# Patient Record
Sex: Male | Born: 1955 | Race: Black or African American | Hispanic: No | Marital: Single | Smoking: Current every day smoker
Health system: Southern US, Community
[De-identification: ages and names within clinical notes are randomized; demographics above are authoritative.]

## PROBLEM LIST (undated history)

## (undated) DIAGNOSIS — J449 Chronic obstructive pulmonary disease, unspecified: Secondary | ICD-10-CM

## (undated) DIAGNOSIS — I10 Essential (primary) hypertension: Secondary | ICD-10-CM

## (undated) DIAGNOSIS — F419 Anxiety disorder, unspecified: Secondary | ICD-10-CM

## (undated) HISTORY — DX: Essential (primary) hypertension: I10

## (undated) HISTORY — DX: Chronic obstructive pulmonary disease, unspecified: J44.9

## (undated) HISTORY — PX: HEMORRHOID SURGERY: SHX153

## (undated) HISTORY — DX: Anxiety disorder, unspecified: F41.9

---

## 2012-06-21 ENCOUNTER — Encounter (HOSPITAL_COMMUNITY): Payer: Self-pay | Admitting: Emergency Medicine

## 2012-06-21 ENCOUNTER — Emergency Department (HOSPITAL_COMMUNITY)
Admission: EM | Admit: 2012-06-21 | Discharge: 2012-06-21 | Disposition: A | Discharge status: Home / Self Care | Payer: Self-pay | Attending: Emergency Medicine | Admitting: Emergency Medicine

## 2012-06-21 ENCOUNTER — Emergency Department (HOSPITAL_COMMUNITY): Payer: Self-pay

## 2012-06-21 DIAGNOSIS — F17200 Nicotine dependence, unspecified, uncomplicated: Secondary | ICD-10-CM | POA: Insufficient documentation

## 2012-06-21 DIAGNOSIS — J441 Chronic obstructive pulmonary disease with (acute) exacerbation: Secondary | ICD-10-CM | POA: Insufficient documentation

## 2012-06-21 DIAGNOSIS — IMO0001 Reserved for inherently not codable concepts without codable children: Secondary | ICD-10-CM

## 2012-06-21 MED ORDER — ALBUTEROL SULFATE HFA 108 (90 BASE) MCG/ACT IN AERS: 1.0000 | INHALATION_SPRAY | Freq: Four times a day (QID) | RESPIRATORY_TRACT | Status: DC | PRN

## 2012-06-21 MED ORDER — PREDNISONE 20 MG PO TABS
60.0000 mg | ORAL_TABLET | Freq: Once | ORAL | Status: AC
  Administered 2012-06-21: 60 mg via ORAL
  Filled 2012-06-21: qty 3

## 2012-06-21 MED ORDER — PREDNISONE 50 MG PO TABS: ORAL_TABLET | ORAL | Status: DC

## 2012-06-21 MED ORDER — IPRATROPIUM BROMIDE 0.02 % IN SOLN
0.5000 mg | RESPIRATORY_TRACT | Status: AC
  Administered 2012-06-21 (×2): 0.5 mg via RESPIRATORY_TRACT
  Filled 2012-06-21 (×2): qty 2.5

## 2012-06-21 MED ORDER — ALBUTEROL SULFATE (5 MG/ML) 0.5% IN NEBU
5.0000 mg | INHALATION_SOLUTION | RESPIRATORY_TRACT | Status: AC
  Administered 2012-06-21 (×2): 5 mg via RESPIRATORY_TRACT
  Filled 2012-06-21: qty 0.5
  Filled 2012-06-21: qty 1

## 2012-06-21 NOTE — ED Notes (Signed)
States that SOB has been a problem for him over the past year. Has gotten progressively worse. No pain verbalized. Patient continues to smoke.

## 2012-06-21 NOTE — ED Notes (Signed)
Patient reports that he started to have SOB x 2 -3 months ago. Recently it is worse with walking short distances and very light movement. The patient reports that he has not had this in the past.

## 2012-06-21 NOTE — ED Notes (Signed)
EKG completed at 11:14

## 2012-06-21 NOTE — ED Provider Notes (Signed)
History     CSN: [id number]  Arrival date & time 06/21/12  9147   First MD Initiated Contact with Patient 06/21/12 1101      Chief Complaint  Patient presents with  . Shortness of Breath    (Consider location/radiation/quality/duration/timing/severity/associated sxs/prior treatment) The history is provided by the patient.  Shin Lamour is a 57 y.o. male chronic smoker here with SOB. He has SOB worse with exertion for a year. It is getting worse now. Over the last week, he gets SOB with walking to the bathroom. No cough or fever. No chest pain. No other cardiac risk factor other than smoking hx. No PE risk factors.    History reviewed. No pertinent past medical history.  History reviewed. No pertinent past surgical history.  No family history on file.  History  Substance Use Topics  . Smoking status: Current Every Day Smoker -- 0.50 packs/day    Types: Cigarettes  . Smokeless tobacco: Not on file  . Alcohol Use: Yes     Comment: occ      Review of Systems  Respiratory: Positive for shortness of breath.   All other systems reviewed and are negative.    Allergies  Review of patient's allergies indicates no known allergies.  Home Medications  No current outpatient prescriptions on file.  BP 151/89  Pulse 63  Temp(Src) 97.8 F (36.6 C) (Oral)  SpO2 100%  Physical Exam  Nursing note and vitals reviewed. Constitutional: He is oriented to person, place, and time. He appears well-developed and well-nourished.  Calm   HENT:  Head: Normocephalic.  Mouth/Throat: Oropharynx is clear and moist.  Eyes: Conjunctivae are normal. Pupils are equal, round, and reactive to light.  Neck: Normal range of motion. Neck supple.  Cardiovascular: Normal rate, regular rhythm and normal heart sounds.   Pulmonary/Chest: Effort normal.  Dec breath sounds throughout. No wheezing. Mod air movement.   Abdominal: Soft. Bowel sounds are normal. He exhibits no distension. There is no  tenderness. There is no rebound.  Musculoskeletal: Normal range of motion. He exhibits no edema and no tenderness.  Neurological: He is alert and oriented to person, place, and time.  Skin: Skin is warm and dry.  Psychiatric: He has a normal mood and affect. His behavior is normal. Judgment and thought content normal.    ED Course  Procedures (including critical care time)  Labs Reviewed - No data to display Dg Chest 2 View  06/21/2012  *RADIOLOGY REPORT*  Clinical Data: Short of breath  CHEST - 2 VIEW  Comparison: None.  Findings: Lungs are moderately hyperaerated.  Subtle 5 mm nodular density is present in the right midlung zone over the right fifth rib.  Normal heart size.  Aorta mildly tortuous.  No pleural effusion.  No pneumothorax.  Bronchitic changes.  IMPRESSION: Changes related to COPD are noted.  5 mm nodular density in the right midlung zone.  61-month follow-up chest radiograph is recommended to ensure stability.   Original Report Authenticated By: Jolaine Click, M.D.      No diagnosis found.   Date: 06/21/2012  Rate: 56  Rhythm: normal sinus rhythm  QRS Axis: normal  Intervals: normal  ST/T Wave abnormalities: nonspecific ST changes  Conduction Disutrbances:none  Narrative Interpretation:   Old EKG Reviewed: none available    MDM  Walden Statz is a 57 y.o. male here with worse SOB. CXR showed COPD changes. I think his symptoms are likely from COPD. I doubt cardiac process for over a year.  He is PERC neg so d-dimer not necessary. Will give PO steroids, nebs x 3. Patient not hypoxic here. Will reassess.    2:35 PM Felt better after nebs x 3, prednisone 60mg . Improved air movement. Minimal wheezing on discharge. Never hypoxic here. Recommend smoking cessation, course of steroids, albuterol.       Richardean Canal, MD 06/21/12 [phone]

## 2012-07-28 ENCOUNTER — Encounter (HOSPITAL_COMMUNITY): Payer: Self-pay | Admitting: Emergency Medicine

## 2012-07-28 ENCOUNTER — Emergency Department (HOSPITAL_COMMUNITY)
Admission: EM | Admit: 2012-07-28 | Discharge: 2012-07-28 | Disposition: A | Discharge status: Home / Self Care | Payer: Medicaid Other | Attending: Emergency Medicine | Admitting: Emergency Medicine

## 2012-07-28 ENCOUNTER — Emergency Department (HOSPITAL_COMMUNITY): Payer: Medicaid Other

## 2012-07-28 DIAGNOSIS — R0600 Dyspnea, unspecified: Secondary | ICD-10-CM

## 2012-07-28 DIAGNOSIS — R0609 Other forms of dyspnea: Secondary | ICD-10-CM | POA: Insufficient documentation

## 2012-07-28 DIAGNOSIS — F17200 Nicotine dependence, unspecified, uncomplicated: Secondary | ICD-10-CM | POA: Insufficient documentation

## 2012-07-28 DIAGNOSIS — R0989 Other specified symptoms and signs involving the circulatory and respiratory systems: Secondary | ICD-10-CM | POA: Insufficient documentation

## 2012-07-28 DIAGNOSIS — R062 Wheezing: Secondary | ICD-10-CM | POA: Insufficient documentation

## 2012-07-28 LAB — CBC
HCT: 43.4 % (ref 39.0–52.0)
Hemoglobin: 14.5 g/dL (ref 13.0–17.0)
MCH: 28.7 pg (ref 26.0–34.0)
MCHC: 33.4 g/dL (ref 30.0–36.0)
MCV: 85.9 fL (ref 78.0–100.0)
Platelets: 249 10*3/uL (ref 150–400)
RBC: 5.05 MIL/uL (ref 4.22–5.81)
RDW: 13.8 % (ref 11.5–15.5)
WBC: 8.5 10*3/uL (ref 4.0–10.5)

## 2012-07-28 LAB — TROPONIN I: Troponin I: <0.3 ng/mL (ref <0.30)

## 2012-07-28 LAB — BASIC METABOLIC PANEL
BUN: 18 mg/dL (ref 6–23)
CO2: 25 mEq/L (ref 19–32)
Calcium: 9.5 mg/dL (ref 8.4–10.5)
Chloride: 106 mEq/L (ref 96–112)
Creatinine, Ser: 0.88 mg/dL (ref 0.50–1.35)
GFR calc Af Amer: >90 mL/min (ref >90)
GFR calc non Af Amer: >90 mL/min (ref >90)
Glucose, Bld: 72 mg/dL (ref 70–99)
Potassium: 4.3 mEq/L (ref 3.5–5.1)
Sodium: 138 mEq/L (ref 135–145)

## 2012-07-28 MED ORDER — ALBUTEROL SULFATE HFA 108 (90 BASE) MCG/ACT IN AERS
2.0000 | INHALATION_SPRAY | Freq: Once | RESPIRATORY_TRACT | Status: AC
  Administered 2012-07-28: 2 via RESPIRATORY_TRACT
  Filled 2012-07-28: qty 6.7

## 2012-07-28 NOTE — Progress Notes (Signed)
During [facility] ED 07/28/12 visit CM spoke with pt who confirms self pay [facility] resident with no pcp. CM discussed and provided written information for self pay pcps, importance of pcp for f/u care, www.needymeds.org, discounted pharmacies, MATCH program and other [place] resources such as financial assistance, DSS and  health department Reviewed Health connect number to assist with finding self pay provider close to pt's residence. Reviewed resources for [place] self pay pcps like [facility], family medicine at [address], [facility] family practice, general medical clinics, [facility] urgent care plus others, [facility] out patient [facility]ies, housing, affordable care act/health reform (deadline 08/03/12) and other resources in [employer]. Pt voiced understanding and appreciation of resources provided  Pt states he has signed up for affordable care act but informed of cost of $400/month.  Pt states he is presently not working Pt states he is attempting to get medicaid in [name] Pt reports moving "a while back" from [name]

## 2012-07-28 NOTE — ED Notes (Signed)
Pt c/o SOB for about 3 weeks. States this has happen before. Has a slight cough but no other symptoms. Denies dizziness, headache, etc.

## 2012-08-05 NOTE — ED Provider Notes (Signed)
History    56yM with sob. Onset about a month ago. Relatively constant. Recent ED eval for same. Reports that symptoms not appreciably changed since last evaluation. Sometimes worse with exertion, but not always. Denies pain. No fever or chills. Occasional nonproductive cough. No unusual leg pain or swelling.   CSN: [id number]  Arrival date & time 07/28/12  4696   First MD Initiated Contact with Patient 07/28/12 [phone]      Chief Complaint  Patient presents with  . Shortness of Breath    (Consider location/radiation/quality/duration/timing/severity/associated sxs/prior treatment) HPI  History reviewed. No pertinent past medical history.  History reviewed. No pertinent past surgical history.  No family history on file.  History  Substance Use Topics  . Smoking status: Current Every Day Smoker -- 0.50 packs/day    Types: Cigarettes  . Smokeless tobacco: Not on file  . Alcohol Use: Yes     Comment: occ      Review of Systems  All systems reviewed and negative, other than as noted in HPI.   Allergies  Review of patient's allergies indicates no known allergies.  Home Medications   Current Outpatient Rx  Name  Route  Sig  Dispense  Refill  . albuterol (PROVENTIL HFA;VENTOLIN HFA) 108 (90 BASE) MCG/ACT inhaler   Inhalation   Inhale 1-2 puffs into the lungs every 6 (six) hours as needed for wheezing.   1 Inhaler   0     BP 126/94  Pulse 65  Temp(Src) 98.9 F (37.2 C) (Oral)  SpO2 97%  Physical Exam  Nursing note and vitals reviewed. Constitutional: He appears well-developed and well-nourished. No distress.  HENT:  Head: Normocephalic and atraumatic.  Eyes: Conjunctivae are normal. Right eye exhibits no discharge. Left eye exhibits no discharge.  Neck: Neck supple.  Cardiovascular: Normal rate, regular rhythm and normal heart sounds.  Exam reveals no gallop and no friction rub.   No murmur heard. Pulmonary/Chest: Effort normal and breath sounds normal. No  respiratory distress.  Faint expiratory wheezing b/  Abdominal: Soft. He exhibits no distension. There is no tenderness.  Musculoskeletal: He exhibits no edema and no tenderness.  Lower extremities symmetric as compared to each other. No calf tenderness. Negative Homan's. No palpable cords.   Neurological: He is alert.  Skin: Skin is warm and dry.  Psychiatric: He has a normal mood and affect. His behavior is normal. Thought content normal.    ED Course  Procedures (including critical care time)  Labs Reviewed  CBC  BASIC METABOLIC PANEL  TROPONIN I   No results found.   1. Dyspnea       MDM  56yM with dyspnea. No increased WOB. O2 sats 97-100% on RA. CXR clear. Trop normal. Doubt PE.         Raeford Razor, MD 08/05/12 1242

## 2012-09-05 ENCOUNTER — Emergency Department (HOSPITAL_COMMUNITY)
Admission: EM | Admit: 2012-09-05 | Discharge: 2012-09-05 | Disposition: A | Discharge status: Home / Self Care | Payer: Medicaid Other | Attending: Emergency Medicine | Admitting: Emergency Medicine

## 2012-09-05 ENCOUNTER — Emergency Department (HOSPITAL_COMMUNITY): Payer: Medicaid Other

## 2012-09-05 ENCOUNTER — Encounter (HOSPITAL_COMMUNITY): Payer: Self-pay | Admitting: Emergency Medicine

## 2012-09-05 DIAGNOSIS — R911 Solitary pulmonary nodule: Secondary | ICD-10-CM | POA: Insufficient documentation

## 2012-09-05 DIAGNOSIS — Z79899 Other long term (current) drug therapy: Secondary | ICD-10-CM | POA: Insufficient documentation

## 2012-09-05 DIAGNOSIS — R05 Cough: Secondary | ICD-10-CM | POA: Insufficient documentation

## 2012-09-05 DIAGNOSIS — F17200 Nicotine dependence, unspecified, uncomplicated: Secondary | ICD-10-CM | POA: Insufficient documentation

## 2012-09-05 DIAGNOSIS — J441 Chronic obstructive pulmonary disease with (acute) exacerbation: Secondary | ICD-10-CM | POA: Insufficient documentation

## 2012-09-05 DIAGNOSIS — R059 Cough, unspecified: Secondary | ICD-10-CM | POA: Insufficient documentation

## 2012-09-05 DIAGNOSIS — J449 Chronic obstructive pulmonary disease, unspecified: Secondary | ICD-10-CM

## 2012-09-05 LAB — COMPREHENSIVE METABOLIC PANEL
ALT: 8 U/L (ref 0–53)
Albumin: 3.5 g/dL (ref 3.5–5.2)
Alkaline Phosphatase: 78 U/L (ref 39–117)
BUN: 12 mg/dL (ref 6–23)
Calcium: 9.5 mg/dL (ref 8.4–10.5)
GFR calc Af Amer: >90 mL/min (ref >90)
Glucose, Bld: 142 mg/dL — ABNORMAL HIGH (ref 70–99)
Potassium: 4 mEq/L (ref 3.5–5.1)
Sodium: 138 mEq/L (ref 135–145)
Total Protein: 6.9 g/dL (ref 6.0–8.3)

## 2012-09-05 LAB — CBC WITH DIFFERENTIAL/PLATELET
Basophils Relative: 0 % (ref 0–1)
Eosinophils Absolute: 0.2 10*3/uL (ref 0.0–0.7)
Eosinophils Relative: 3 % (ref 0–5)
Lymphs Abs: 3 10*3/uL (ref 0.7–4.0)
MCH: 28.8 pg (ref 26.0–34.0)
MCHC: 34.3 g/dL (ref 30.0–36.0)
MCV: 83.9 fL (ref 78.0–100.0)
Neutrophils Relative %: 48 % (ref 43–77)
Platelets: 195 10*3/uL (ref 150–400)
RBC: 5.1 MIL/uL (ref 4.22–5.81)

## 2012-09-05 LAB — TROPONIN I: Troponin I: <0.3 ng/mL (ref <0.30)

## 2012-09-05 MED ORDER — PREDNISONE 10 MG PO TABS: 20.0000 mg | ORAL_TABLET | Freq: Every day | ORAL | Status: DC

## 2012-09-05 MED ORDER — GUAIFENESIN 100 MG/5ML PO LIQD: 200.0000 mg | Freq: Three times a day (TID) | ORAL | Status: DC | PRN

## 2012-09-05 MED ORDER — ALBUTEROL SULFATE HFA 108 (90 BASE) MCG/ACT IN AERS
2.0000 | INHALATION_SPRAY | Freq: Once | RESPIRATORY_TRACT | Status: AC
  Administered 2012-09-05: 2 via RESPIRATORY_TRACT
  Filled 2012-09-05: qty 6.7

## 2012-09-05 MED ORDER — ALBUTEROL SULFATE (5 MG/ML) 0.5% IN NEBU
5.0000 mg | INHALATION_SOLUTION | Freq: Once | RESPIRATORY_TRACT | Status: AC
  Administered 2012-09-05: 5 mg via RESPIRATORY_TRACT
  Filled 2012-09-05: qty 1
  Filled 2012-09-05 (×2): qty 0.5

## 2012-09-05 MED ORDER — METHYLPREDNISOLONE SODIUM SUCC 125 MG IJ SOLR
125.0000 mg | Freq: Once | INTRAMUSCULAR | Status: AC
  Administered 2012-09-05: 125 mg via INTRAVENOUS
  Filled 2012-09-05: qty 2

## 2012-09-05 NOTE — ED Notes (Addendum)
Pt to ED with c/o SOB since last year that worsen today. Pt states SOB increased with activity. Pt is a smoker for 58yrs, 0.5 pack per day.

## 2012-09-05 NOTE — ED Provider Notes (Signed)
History     CSN: [id number]  Arrival date & time 09/05/12  2130   First MD Initiated Contact with Patient 09/05/12 [phone]      Chief Complaint  Patient presents with  . Shortness of Breath    (Consider location/radiation/quality/duration/timing/severity/associated sxs/prior treatment) HPI PT with 1 year of progressive SOB, non-productive cough. SOB is worse with exertion. No lower ext swelling or pain. Pt continues to smoke regularly. No recent travel or surgery. No CP, fever chills.  History reviewed. No pertinent past medical history.  History reviewed. No pertinent past surgical history.  History reviewed. No pertinent family history.  History  Substance Use Topics  . Smoking status: Current Every Day Smoker -- 0.50 packs/day    Types: Cigarettes  . Smokeless tobacco: Not on file  . Alcohol Use: Yes     Comment: occ      Review of Systems  Constitutional: Negative for fever and chills.  HENT: Negative for congestion, sore throat and rhinorrhea.   Respiratory: Positive for cough and shortness of breath. Negative for chest tightness and wheezing.   Cardiovascular: Negative for chest pain, palpitations and leg swelling.  Gastrointestinal: Negative for nausea, vomiting and abdominal pain.  Skin: Negative for rash and wound.  Neurological: Negative for dizziness, weakness, light-headedness, numbness and headaches.  All other systems reviewed and are negative.    Allergies  Review of patient's allergies indicates no known allergies.  Home Medications   Current Outpatient Rx  Name  Route  Sig  Dispense  Refill  . albuterol (PROVENTIL HFA;VENTOLIN HFA) 108 (90 BASE) MCG/ACT inhaler   Inhalation   Inhale 1-2 puffs into the lungs every 6 (six) hours as needed for wheezing.   1 Inhaler   0   . guaiFENesin (ROBITUSSIN) 100 MG/5ML liquid   Oral   Take 10 mLs (200 mg total) by mouth 3 (three) times daily as needed for cough.   120 mL   0   . predniSONE (DELTASONE)  10 MG tablet   Oral   Take 2 tablets (20 mg total) by mouth daily.   10 tablet   0     BP 156/64  Pulse 75  Temp(Src) 98 F (36.7 C) (Oral)  Resp 25  SpO2 98%  Physical Exam  Nursing note and vitals reviewed. Constitutional: He is oriented to person, place, and time. He appears well-developed and well-nourished. No distress.  HENT:  Head: Normocephalic and atraumatic.  Mouth/Throat: Oropharynx is clear and moist.  Eyes: EOM are normal. Pupils are equal, round, and reactive to light.  Neck: Normal range of motion. Neck supple.  Cardiovascular: Normal rate and regular rhythm.   Pulmonary/Chest: Effort normal and breath sounds normal. No respiratory distress. He has no wheezes. He has no rales. He exhibits no tenderness.  Decreased air movement. No definite wheezing  Abdominal: Soft. Bowel sounds are normal. He exhibits no distension and no mass. There is no tenderness. There is no rebound and no guarding.  Musculoskeletal: Normal range of motion. He exhibits no edema and no tenderness.  No calf swelling or tenderness.   Neurological: He is alert and oriented to person, place, and time.  Skin: Skin is warm and dry. No rash noted. No erythema.  Psychiatric: He has a normal mood and affect. His behavior is normal.    ED Course  Procedures (including critical care time)  Labs Reviewed  COMPREHENSIVE METABOLIC PANEL - Abnormal; Notable for the following:    Glucose, Bld 142 (*)  GFR calc non Af Amer 90 (*)    All other components within normal limits  CBC WITH DIFFERENTIAL  TROPONIN I  PRO B NATRIURETIC PEPTIDE   Dg Chest 2 View  09/05/2012  *RADIOLOGY REPORT*  Clinical Data: Shortness of breath  CHEST - 2 VIEW  Comparison: 07/28/2012  Findings: Cardiomediastinal silhouette is stable.  No acute infiltrate or pulmonary edema.  A vague nodular density right midlung is stable from prior exam.  IMPRESSION: No active disease.  No significant change.   Original Report Authenticated  By: Natasha Mead, M.D.      1. COPD (chronic obstructive pulmonary disease)   2. Pulmonary nodule       MDM    Advised to stop smoking. Advised to find PMD. Advised to f/u with PMD for lung nodule.       Loren Racer, MD 09/06/12 [phone]

## 2012-10-11 ENCOUNTER — Encounter (HOSPITAL_COMMUNITY): Payer: Self-pay | Admitting: *Deleted

## 2012-10-11 ENCOUNTER — Observation Stay (HOSPITAL_COMMUNITY)
Admission: EM | Admit: 2012-10-11 | Discharge: 2012-10-13 | Disposition: A | Discharge status: Home / Self Care | Payer: Medicaid Other | Attending: Family Medicine | Admitting: Family Medicine

## 2012-10-11 ENCOUNTER — Emergency Department (HOSPITAL_COMMUNITY): Payer: Medicaid Other

## 2012-10-11 DIAGNOSIS — R0602 Shortness of breath: Secondary | ICD-10-CM | POA: Insufficient documentation

## 2012-10-11 DIAGNOSIS — R9431 Abnormal electrocardiogram [ECG] [EKG]: Secondary | ICD-10-CM | POA: Insufficient documentation

## 2012-10-11 DIAGNOSIS — I10 Essential (primary) hypertension: Secondary | ICD-10-CM | POA: Diagnosis present

## 2012-10-11 DIAGNOSIS — J4489 Other specified chronic obstructive pulmonary disease: Principal | ICD-10-CM | POA: Insufficient documentation

## 2012-10-11 DIAGNOSIS — F17200 Nicotine dependence, unspecified, uncomplicated: Secondary | ICD-10-CM | POA: Insufficient documentation

## 2012-10-11 DIAGNOSIS — J449 Chronic obstructive pulmonary disease, unspecified: Secondary | ICD-10-CM | POA: Diagnosis present

## 2012-10-11 DIAGNOSIS — R030 Elevated blood-pressure reading, without diagnosis of hypertension: Secondary | ICD-10-CM | POA: Insufficient documentation

## 2012-10-11 DIAGNOSIS — Z79899 Other long term (current) drug therapy: Secondary | ICD-10-CM | POA: Insufficient documentation

## 2012-10-11 LAB — CBC
HCT: 41.8 % (ref 39.0–52.0)
Hemoglobin: 14.4 g/dL (ref 13.0–17.0)
Hemoglobin: 14.5 g/dL (ref 13.0–17.0)
MCH: 29.6 pg (ref 26.0–34.0)
MCH: 29.4 pg (ref 26.0–34.0)
MCHC: 34.4 g/dL (ref 30.0–36.0)
MCV: 86 fL (ref 78.0–100.0)
RBC: 4.93 MIL/uL (ref 4.22–5.81)

## 2012-10-11 LAB — BASIC METABOLIC PANEL
BUN: 16 mg/dL (ref 6–23)
Creatinine, Ser: 1.02 mg/dL (ref 0.50–1.35)
GFR calc non Af Amer: 79 mL/min — ABNORMAL LOW (ref >90)
Glucose, Bld: 87 mg/dL (ref 70–99)
Potassium: 3.9 mEq/L (ref 3.5–5.1)

## 2012-10-11 LAB — CREATININE, SERUM
Creatinine, Ser: 0.89 mg/dL (ref 0.50–1.35)
GFR calc Af Amer: >90 mL/min (ref >90)
GFR calc non Af Amer: >90 mL/min (ref >90)

## 2012-10-11 LAB — TROPONIN I: Troponin I: <0.3 ng/mL (ref <0.30)

## 2012-10-11 MED ORDER — ACETAMINOPHEN 325 MG PO TABS
650.0000 mg | ORAL_TABLET | Freq: Four times a day (QID) | ORAL | Status: DC | PRN
  Administered 2012-10-12: 650 mg via ORAL
  Filled 2012-10-11: qty 2

## 2012-10-11 MED ORDER — ALBUTEROL SULFATE HFA 108 (90 BASE) MCG/ACT IN AERS
2.0000 | INHALATION_SPRAY | RESPIRATORY_TRACT | Status: DC | PRN
  Filled 2012-10-11: qty 6.7

## 2012-10-11 MED ORDER — PREDNISONE 50 MG PO TABS
60.0000 mg | ORAL_TABLET | Freq: Every day | ORAL | Status: DC
  Administered 2012-10-12 – 2012-10-13 (×2): 60 mg via ORAL
  Filled 2012-10-11 (×3): qty 1

## 2012-10-11 MED ORDER — ACETAMINOPHEN 650 MG RE SUPP: 650.0000 mg | Freq: Four times a day (QID) | RECTAL | Status: DC | PRN

## 2012-10-11 MED ORDER — PREDNISONE 20 MG PO TABS
60.0000 mg | ORAL_TABLET | Freq: Once | ORAL | Status: AC
  Administered 2012-10-11: 60 mg via ORAL
  Filled 2012-10-11: qty 3

## 2012-10-11 MED ORDER — SODIUM CHLORIDE 0.9 % IV SOLN: INTRAVENOUS | Status: DC

## 2012-10-11 MED ORDER — ALBUTEROL SULFATE (5 MG/ML) 0.5% IN NEBU
2.5000 mg | INHALATION_SOLUTION | RESPIRATORY_TRACT | Status: DC | PRN
  Administered 2012-10-13: 2.5 mg via RESPIRATORY_TRACT

## 2012-10-11 MED ORDER — ALBUTEROL SULFATE (5 MG/ML) 0.5% IN NEBU
5.0000 mg | INHALATION_SOLUTION | Freq: Once | RESPIRATORY_TRACT | Status: AC
  Administered 2012-10-11: 5 mg via RESPIRATORY_TRACT
  Filled 2012-10-11 (×2): qty 0.5

## 2012-10-11 MED ORDER — HEPARIN SODIUM (PORCINE) 5000 UNIT/ML IJ SOLN
5000.0000 [IU] | Freq: Three times a day (TID) | INTRAMUSCULAR | Status: DC
  Filled 2012-10-11 (×8): qty 1

## 2012-10-11 MED ORDER — SODIUM CHLORIDE 0.9 % IJ SOLN
3.0000 mL | Freq: Two times a day (BID) | INTRAMUSCULAR | Status: DC
  Administered 2012-10-12 – 2012-10-13 (×2): 3 mL via INTRAVENOUS

## 2012-10-11 MED ORDER — PREDNISONE 20 MG PO TABS: 60.0000 mg | ORAL_TABLET | Freq: Every day | ORAL | Status: DC

## 2012-10-11 MED ORDER — ASPIRIN 325 MG PO TABS
325.0000 mg | ORAL_TABLET | Freq: Once | ORAL | Status: AC
  Administered 2012-10-11: 325 mg via ORAL
  Filled 2012-10-11: qty 1

## 2012-10-11 MED ORDER — ALBUTEROL SULFATE (5 MG/ML) 0.5% IN NEBU: 2.5000 mg | INHALATION_SOLUTION | RESPIRATORY_TRACT | Status: DC | PRN

## 2012-10-11 MED ORDER — TIOTROPIUM BROMIDE MONOHYDRATE 18 MCG IN CAPS
18.0000 ug | ORAL_CAPSULE | Freq: Every day | RESPIRATORY_TRACT | Status: DC
  Administered 2012-10-12: 18 ug via RESPIRATORY_TRACT
  Filled 2012-10-11: qty 5

## 2012-10-11 NOTE — ED Notes (Signed)
Pt reports being sob x 4-5 months and has been seen here for same multiple times, given albuterol inhalers for bronchitis. Reports only temp relief with the inhalers, feels very sob with mild exertion and generalized fatigue. Airway is intact, speaking in full sentences. spo2 97%.

## 2012-10-11 NOTE — ED Notes (Signed)
Admitting MD at bedside.

## 2012-10-11 NOTE — H&P (Signed)
Family Medicine Teaching [facility] Admission History and Physical Service Pager: [phone]  Patient name: [facility] Medical record number: [id number] Date of birth: [birth year] Age: 57 y.o. Gender: male  Primary Care Provider: No PCP Per Patient  Chief Complaint: shortness of breath  Assessment and Plan: Gavin Kane is a 57 y.o. year old male with SOB for a year or more, progressively worse for the past 4-6 months. No known significant PMH, but pt has not seen a doctor regularly "in years." Several ED visits since February for similar complaints with intermittent improvement with steroid bursts and "inhalers." Concern in the ED for non-specific ST-changes on EKG (see below), but denies any chest pain.   #SOB - Suspect COPD +/- true exacerbation; strongly doubt PE with no other s/s or risk factors other than smoking -pt has never had formal PFT's or controller medication; will need formal evaluation once pt can established with a PCP -CXR here with no acute process  -previous CXR's read as having a right-sided nodular density (5/3, 3/25)  -no nodule appreciated on CXR today, by radiologist read or my read [ ]  prednisone 60 mg daily for 5 days (last day 6/12) [ ]  added Spiriva daily, likely will continue at discharge [ ]  albuterol q4 PRN [ ]  supplemental O2 as needed, maintain sats >92% [ ]  consider repeat CXR/further imaging [ ]  CM consulted in anticipation of discharge on inhaled medication with no insurance and no PCP  #Abnormal EKG - non-specific ST changes, slightly more prominent than previous EKG's -denies chest pain now or previously; negative POC troponin in the ED -history not very suspicious for cardiac disease and physical exam not suggestive of frank heart failure [ ]  cycle troponins [ ]  cardiac telemetry and clinical monitoring for any chest pain [ ]  repeat EKG in the AM  #Low energy - Uncertain etiology, possibly related to ?chronic poor lung function -could  consider thyroid work-up as an outpt -no planned intervention at this time  #Current tobacco use - "stopped" between 1-4 months ago, but "occasionally takes a puff," currently [ ]  consider nicotine patch per preference [ ]  CSW for smoking cessation counseling and to assist CM as above  FEN/GI: cardiac diet, saline lock IV Prophylaxis: subQ heparin Disposition: Admitting to med-surg bed, attending Dr. Deirdre Priest  -management as above; discharge likely home pending clinical improvement and med/PCP arrangements Code Status: Full  History of Present Illness: Gavin Kane is a 57 y.o. year old male presenting with worsening SOB for "several months" and general lack of energy. No known PMH, though pt has not seen a doctor regularly in several years. SOB has been present for about 1-1.5 years, but has been worse for 4-6 months; pt has been seen in the ED several times and been given steroid bursts and "inhalers," but has only intermittent improvement with them; pt is vague about many details of his history. Does not think SOB has been particularly worse recently, but pt "was finally tired of it" and came into the ED, today. Denies frank cough, chest pain, or definite triggers for SOB. Pt states he can get winded after climbing a flight or two of stairs, after "walking or working, being up and down a lot," or without true exertion such as "after arguing with his grandkids." Denies leg swelling or orthopnea but difficult to assess due to vague description and due to fact that pt sleeps "flat down on his stomach." Does state he urinates "a lot" at night but attributes this to "  drinking a lot of water."  Pt is a smoker; he "quit" 1-4 months ago but "occasionally will still have a puff now and then." Endorses remote hx of cocaine use; has never used marijuana. EtOH use only rarely. Denies fever/chills, N/V, diarrhea, skin changes, sweats, or weight loss. Pt actually states he is "gaining weight." Denies  skin/nail/hair changes. Pt takes no medications other than aspirin "because that's all [he has]."  Patient Active Problem List   Diagnosis Date Noted  . Shortness of breath 10/11/2012  . Nonspecific ST-T changes 10/11/2012  . Elevated blood pressure 10/11/2012   Past Medical History: History reviewed. No pertinent past medical history. Past Surgical History: History reviewed. No pertinent past surgical history. Social History: History  Substance Use Topics  . Smoking status: Current Every Day Smoker -- 0.50 packs/day    Types: Cigarettes  . Smokeless tobacco: Not on file  . Alcohol Use: Yes     Comment: occ   For any additional social history documentation, please refer to relevant sections of EMR.  Family History: History reviewed. No pertinent family history. Allergies: No Known Allergies No current facility-administered medications on file prior to encounter.   No current outpatient prescriptions on file prior to encounter.   Review Of Systems: Per HPI. Otherwise 12 point review of systems was performed and was unremarkable.  Physical Exam: BP 139/81  Pulse 67  Temp(Src) 97.4 F (36.3 C) (Oral)  Resp 20  Ht 5\' 5"  (1.651 m)  Wt 135 lb (61.236 kg)  BMI 22.47 kg/m2  SpO2 99% Exam: General: non-toxic appearing adult male in NAD, speaking comfortably in full sentences on RA, SpO2 93-96% HEENT: [place]/AT, EOMI, PERRLA, MMM  Neck supple, no cervical lymphadenopathy or tenderness, thyroid not frankly enlarged Cardiovascular: RRR, no murmur appreciated Respiratory: CTAB, mildy diminished breath sounds throughout but no wheezing, and comfortable work of breathing Abdomen: soft, nontender, BS+ Extremities: warm, well-perfused; skin dry and with some scaling to bilat LE, but no swelling/redness/tenderness  No pitting edema bilaterally; moves all extremities equally Skin: chronic changes to bilat LE, but otherwise warm, dry, intact without frank rash Neuro: grossly intact;  alert/oriented, no gross focal deficit, equal strength bilaterally  Labs and Imaging: CBC BMET   Recent Labs Lab 10/11/12 1843  WBC 10.7*  HGB 14.5  HCT 42.4  PLT 228    Recent Labs Lab 10/11/12 1333 10/11/12 1843  NA 138  --   K 3.9  --   CL 107  --   CO2 25  --   BUN 16  --   CREATININE 1.02 0.89  GLUCOSE 87  --   CALCIUM 9.0  --      POC troponin 6/8: NEGATIVE EKG 6/8 @1353 : sinus rhythm, appears similar to prior study  -nonspecific ST changes most prominent in II, III, V5, and V6, more prominent than 09/05/2012  -no frank ST depression or elevation  CXR 6/8 @1408  - no acute disease noted  -of note, previous CXR had been read as having a R-sided nodule (3/25, 5/3)  -no nodule appreciated on this exam by radiologist or my read  Maryjean Ka, MD 10/11/2012, 8:31 PM  I have seen, examined and evaluated the patient, discussed with Dr. Casper Harrison, and agree with the above. Mannie Wineland 10/12/2012, 7:22 AM

## 2012-10-11 NOTE — ED Provider Notes (Signed)
History     CSN: [id number]  Arrival date & time 10/11/12  1221   First MD Initiated Contact with Patient 10/11/12 1256      Chief Complaint  Patient presents with  . Shortness of Breath    (Consider location/radiation/quality/duration/timing/severity/associated sxs/prior treatment) HPI Gavin Kane is a 57 y.o. male current everyday smoker with a history of COPD and pulmonary nodule presents emergency department complaining of shortness of breath.  Patient states symptoms have been occurring over the last year, however he is concerned that it is not improved.  Patient has been seen in emergency department for similar presentation in past and has been advised to followup with her primary care provider to follow disease, however patient has been noncompliant with followup.  Symptoms are mildly improved with inhalers.  Patient denies fever, cough, night sweats, chills, weight loss, hemoptysis, leg swelling, chest pain.  History reviewed. No pertinent past medical history.  History reviewed. No pertinent past surgical history.  History reviewed. No pertinent family history.  History  Substance Use Topics  . Smoking status: Current Every Day Smoker -- 0.50 packs/day    Types: Cigarettes  . Smokeless tobacco: Not on file  . Alcohol Use: Yes     Comment: occ      Review of Systems Ten systems reviewed and are negative for acute change, except as noted in the HPI.    Allergies  Review of patient's allergies indicates no known allergies.  Home Medications   Current Outpatient Rx  Name  Route  Sig  Dispense  Refill  . albuterol (PROVENTIL HFA;VENTOLIN HFA) 108 (90 BASE) MCG/ACT inhaler   Inhalation   Inhale 1-2 puffs into the lungs every 6 (six) hours as needed for wheezing.   1 Inhaler   0   . guaiFENesin (ROBITUSSIN) 100 MG/5ML liquid   Oral   Take 10 mLs (200 mg total) by mouth 3 (three) times daily as needed for cough.   120 mL   0   . predniSONE (DELTASONE) 10 MG  tablet   Oral   Take 2 tablets (20 mg total) by mouth daily.   10 tablet   0     BP 133/75  Pulse 64  Temp(Src) 98.1 F (36.7 C) (Oral)  Resp 20  Ht 5\' 5"  (1.651 m)  Wt 135 lb (61.236 kg)  BMI 22.47 kg/m2  SpO2 97%  Physical Exam  Nursing note and vitals reviewed. Constitutional: He is oriented to person, place, and time. He appears well-developed and well-nourished. No distress.  HENT:  Head: Normocephalic and atraumatic.  Eyes: Conjunctivae and EOM are normal.  Neck: Normal range of motion.  Cardiovascular:  RRR, intact distal pulses, no pitting edema  Pulmonary/Chest: Effort normal.  LCAB, no respiratory distress.  Patient speaking full sentences without difficulty.  Musculoskeletal: Normal range of motion.  No calf tenderness or lower extremity swelling  Neurological: He is alert and oriented to person, place, and time.  Skin: Skin is warm and dry. No rash noted. He is not diaphoretic.  Psychiatric: He has a normal mood and affect. His behavior is normal.    ED Course  Procedures (including critical care time)  Labs Reviewed  BASIC METABOLIC PANEL - Abnormal; Notable for the following:    GFR calc non Af Amer 79 (*)    All other components within normal limits  CBC  POCT I-STAT TROPONIN I   Dg Chest 2 View  10/11/2012   *RADIOLOGY REPORT*  Clinical Data: Shortness of  breath.  Tachycardia, COPD.  CHEST - 2 VIEW  Comparison: 09/05/2012  Findings: Heart size is normal.  Lungs are free of focal consolidations and pleural effusions. Visualized osseous structures have a normal appearance.  IMPRESSION: Negative exam.   Original Report Authenticated By: Norva Pavlov, M.D.     Date: 10/11/2012  Rate: 57  Rhythm: normal sinus rhythm  QRS Axis: normal  Intervals: normal  ST/T Wave abnormalities: T wave inversion inferior lateral leads  Conduction Disutrbances:none  Narrative Interpretation:   Old EKG Reviewed: changes noted, reviewed w attending  Pt is CP free  throughout hospital stay, but based on new T wave changes will get 2nd trop & observe for cardiac r/o.    No diagnosis found.    MDM  Dyspnea, T wave inversion  Patient with worsening dyspnea over the last 4-5 months he was seen in the emergency department for similar multiple times. Patient reports minimal relief with inhalers.  Patient chest pain-free throughout hospital stay, however new T wave inversions seen in inferior and lateral leads.  Chest x-ray without acute abnormalities.  First troponin negative second troponin pending.  Patient to be admitted for observation and cardiac rule out for dyspnea.  Discussed the patient is agreeable with plan.  The patient appears reasonably stabilized for admission considering the current resources, flow, and capabilities available in the ED at this time, and I doubt any other [facility] requiring further screening and/or treatment in the ED prior to admission.         Jaci Carrel, [place] 10/11/12 1535

## 2012-10-12 ENCOUNTER — Encounter (HOSPITAL_COMMUNITY): Payer: Self-pay | Admitting: Cardiology

## 2012-10-12 LAB — TSH: TSH: 0.9 u[IU]/mL (ref 0.350–4.500)

## 2012-10-12 LAB — TROPONIN I: Troponin I: <0.3 ng/mL (ref <0.30)

## 2012-10-12 MED ORDER — HYDROCHLOROTHIAZIDE 12.5 MG PO CAPS
12.5000 mg | ORAL_CAPSULE | Freq: Every day | ORAL | Status: DC
  Administered 2012-10-12 – 2012-10-13 (×2): 12.5 mg via ORAL
  Filled 2012-10-12 (×2): qty 1

## 2012-10-12 NOTE — Consult Note (Addendum)
Cardiology Consult Note  Admit date: 10/11/2012 Name: Gavin Kane 57 y.o.  male DOB:  06-30-1955 MRN:  [id number]  Today's date:  10/12/2012  Referring Physician:    [facility]  Primary Physician:   None  Reason for Consultation:  Progressive shortness of breath  IMPRESSIONS: 1. Progressive dyspnea and exertion that is somewhat chronic with labile EKG changes 2. Elevated blood pressure without previous diagnosis of hypertension Because of hypertrophy seen on ECHO may have prior untreated hypertension 3. Labile EKG changes 4. Tobacco abuse  RECOMMENDATION: His history is subacute but he has had multiple emergency room visits for this. I would recommend that he have a stress nuclear perfusion scan to initiate the evaluation. He also should have a set of pulmonary function tests. His echocardiogram does show left ventricular hypertrophy and mild diastolic dysfunction and he may have previously untreated hypertension. Also would check a fasting lipid panel on him.  HISTORY: This 57 year-old black male moved here about 8 months ago from [place]. 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 He has not worked since coming to [place]. He has had progressive dyspnea on exertion over the past 2-3 years accompanied with severe fatigue and lack of energy. He finds it difficult to walk long distances and complains of significant shortness of breath and uses an inhaler. He has no PND orthopnea. He does not have chest pain suggestive of angina. He'll occasionally complain of palpitations and his heart beating hard and fast. He has no significant edema. He has no family history of cardiac disease and does smoke but has not smoked in about 6 weeks.  History reviewed. No pertinent past medical history.    Past Surgical History  Procedure Laterality Date  . Hemorrhoid surgery       Allergies:  has No Known  Allergies.   Medications: Prior to Admission medications   Medication Sig Start Date End Date Taking? Authorizing Provider  predniSONE (DELTASONE) 20 MG tablet Take 3 tablets (60 mg total) by mouth daily. 10/11/12   Jaci Carrel, PA-C    Family History: Family Status  Relation Status Death Age  . Father Deceased 76    died PAD, had amputation  . Mother Deceased 57    died of alcoholism  . Brother Deceased 51    died of possible OD    Social History:   reports that he has been smoking Cigarettes.  He has a 30 pack-year smoking history. He does not have any smokeless tobacco history on file. He reports that  drinks alcohol. He reports that he does not use illicit drugs.   History   Social History Narrative   Worked as Academic librarian until 2007.  Also sanded floors part time in past. Never married.  2 children.     Review of Systems: He has gained some weight since moving down here. He wears reading glasses. He has no gastrointestinal complaints. No difficulty with nocturia hematuria or impotence. He has no claudication. No significant arthritis. No headache or stroke previously. Other than as noted above the remainder of the review of systems is unremarkable.  Physical Exam: BP 136/82  Pulse 69  Temp(Src) 98.3 F (36.8 C) (Oral)  Resp 18  Ht 5\' 5"  (1.651 m)  Wt 64.456 kg (142 lb 1.6 oz)  BMI 23.65 kg/m2  SpO2 100%  General appearance: alert, cooperative, appears stated age and no distress  Head: Normocephalic, without obvious abnormality, atraumatic Eyes: conjunctivae/corneas clear. PERRL, EOM's intact. Fundi benign. Throat: Several missing teeth  Neck: no adenopathy, no carotid bruit, no JVD and supple, symmetrical, trachea midline Lungs: clear to auscultation bilaterally Heart: regular rate and rhythm, S1, S2 normal, no murmur, click, rub or gallop Abdomen: soft, non-tender; bowel sounds normal; no masses,  no organomegaly Rectal: deferred Extremities: extremities normal,  atraumatic, no cyanosis or edema Pulses: 2+ and symmetric Skin: Skin color, texture, turgor normal. No rashes or lesions Lymph nodes: Cervical, supraclavicular, and axillary nodes normal. Neurologic: Grossly normal  Labs: CBC  Recent Labs  10/11/12 1843  WBC 10.7*  RBC 4.93  HGB 14.5  HCT 42.4  PLT 228  MCV 86.0  MCH 29.4  MCHC 34.2  RDW 13.8   CMP   Recent Labs  10/11/12 1333 10/11/12 1843  NA 138  --   K 3.9  --   CL 107  --   CO2 25  --   GLUCOSE 87  --   BUN 16  --   CREATININE 1.02 0.89  CALCIUM 9.0  --   GFRNONAA 79* >90  GFRAA >90 >90   BNP (last 3 results)  Recent Labs  09/05/12 0956  PROBNP <5.0   Cardiac Panel (last 3 results)  Recent Labs  10/11/12 1843 10/11/12 2350 10/12/12 0410  TROPONINI <0.30 <0.30 <0.30     Radiology: Normal size heart  EKG: EKG from yesterday shows significant ST depression T wave inversion in inferolateral leads which is somewhat resolved on an EKG this morning. He has previous ST and T-wave abnormalities dating back to February 16 that has been labile  Signed:  W. Ashley Royalty MD [facility]   Cardiology Consultant  10/12/2012, 7:39 PM

## 2012-10-12 NOTE — Progress Notes (Signed)
  Echocardiogram 2D Echocardiogram has been performed.  Kyngston Pickelsimer FRANCES 10/12/2012, 6:12 PM

## 2012-10-12 NOTE — Care Management Note (Signed)
  Page 2 of 2   10/13/2012     9:59:27 AM   CARE MANAGEMENT NOTE 10/13/2012  Patient:  Gavin Kane,Gavin Kane   Account Number:  [id number]  Date Initiated:  10/12/2012  Documentation initiated by:  Donn Pierini  Subjective/Objective Assessment:   Pt admitted with SOB, EKG changes     Action/Plan:   PTA pt lived at home-   Anticipated DC Date:  10/12/2012   Anticipated DC Plan:  HOME/SELF CARE      DC Planning Services  CM consult      Choice offered to / List presented to:             Status of service:  In process, will continue to follow Medicare Important Message given?   (If response is "NO", the following Medicare IM given date fields will be blank) Date Medicare IM given:   Date Additional Medicare IM given:    Discharge Disposition:    Per UR Regulation:  Reviewed for med. necessity/level of care/duration of stay  If discussed at Long Length of Stay Meetings, dates discussed:    Comments:  10-13-12 0957 Tomi Bamberger, RN,BSN [phone] CM did speak to pt and the [facility] will f/u with pt outpt and set him up with the [facility] card for medication assistance. Pt willbe d/c on prednisone and HCTZ. Pt is aware to get from [facility] at cheap price. Pt's inhalers to be given to him at d/c. FC did speak to pt and the medicaid application was filled out and will be submitted to DSS today. Pt is aware that it will be a 90 day wait for approval. No further needs from CM at this time.   10/12/12- 1525- Donn Pierini RN, BSN [phone] Referral received for potential medication assistance- spoke with pt at bedside regarding d/c needs - per conversation pt states that he is hopeful to f/u with the outpt clinics- he reports that he spoke with the MD regarding this and thinks that this is the plan. Explained that he would be given his inhalers that he has been taking from here to take with him at discharge- any other meds that are prescribed and not on $4 list - can be looked  at for assisting through the [facility] program. Pt reports that he does have a friend that can provide transportation at discharge. NCM will cont. to follow for any additional needs.

## 2012-10-12 NOTE — Progress Notes (Signed)
Notified by CCMD pts HR up to 140's nonsustained.  Pt trying to figure out how to turn overhead lights off in room.  Denies CP/SOB/palpitaitons.  HR back to SR 88.  Strip printed and posted at chart. Will continue to monitor. Dierdre Highman, RN

## 2012-10-12 NOTE — H&P (Signed)
Family Medicine Teaching Service Attending Note  I interviewed and examined patient Gavin Kane and reviewed their tests and x-rays.  I discussed with Dr. Louanne Belton and reviewed their note for today.  I agree with their assessment and plan.     Additionally  Feels ok this AM  No chest pain or shortness of breath His dyspnea occurs with activity or emotion and has been worsening for months subacutely No chest pain with exertion His dyspnea could be pulmonary but given his lack of O2 requirement I wonder if could be an anginal equivalent? Would do amb pulse ox and recommend for coronary artery disease evaluation - EST or Myoview - need to decide if can get as an outpatient.

## 2012-10-12 NOTE — Progress Notes (Addendum)
FMTS Attending Admission Note: Gavin Vallin,MD I  have seen and examined this patient, reviewed their chart. I have discussed this patient with the resident. I agree with the resident's findings, assessment and care plan.  Patient will benefit from cardiac work up,ECHO at the [facility] agree with curb side consult with cardiology for regular vs stress ECHO either inpatient or outpatient,but since he does not have insurance the chances that this will happen as outpatient is slim. He is stable from respiratory stand point,PFT as out patient.

## 2012-10-12 NOTE — Progress Notes (Signed)
Family Medicine Teaching Service Daily Progress Note Intern Pager: [phone]  Patient name: Gavin Kane Medical record number: [id number] Date of birth: [birth year] Age: 57 y.o. Gender: male  Primary Care Provider: No PCP Per Patient  Subjective: Pt seen at bedside. Feeling okay this morning. Denies active SOB at this time. Still no chest pain. Interested in going home today if possible and in following up with [place].  Objective: Temp:  [97.4 F (36.3 C)-98.1 F (36.7 C)] 97.5 F (36.4 C) (06/09 0700) Pulse Rate:  [58-77] 65 (06/09 0700) Resp:  [15-26] 18 (06/09 0700) BP: (91-161)/(69-109) 161/92 mmHg (06/09 0700) SpO2:  [96 %-100 %] 97 % (06/09 0700) Weight:  [135 lb (61.236 kg)-142 lb 1.6 oz (64.456 kg)] 142 lb 1.6 oz (64.456 kg) (06/09 0700) Exam: General: non-toxic appearing adult male in NAD, speaking comfortably in full sentences on RA, Cardiovascular: RRR, no murmur appreciated  Respiratory: CTAB, no wheezing, comfortable work of breathing  Abdomen: soft, nontender, BS+  Extremities: warm, well-perfused; skin dry and with some scaling to bilat LE, but no swelling/redness/tenderness   No pitting edema bilaterally; moves all extremities equally  Skin: chronic changes to bilat LE, but otherwise warm, dry, intact without frank rash   Laboratory:  Recent Labs Lab 10/11/12 1333 10/11/12 1843  WBC 7.6 10.7*  HGB 14.4 14.5  HCT 41.8 42.4  PLT 207 228    Recent Labs Lab 10/11/12 1333 10/11/12 1843  NA 138  --   K 3.9  --   CL 107  --   CO2 25  --   BUN 16  --   CREATININE 1.02 0.89  CALCIUM 9.0  --   GLUCOSE 87  --    Imaging/Diagnostic Tests: CXR, 6/8 @1408 : normal chest xray without acute findings EKG 6/8 @1353 : non-specific ST changes, slightly more prominent than previous EKG's EKG 6/9 @0857 : non-specific ST changes remain, less prominent than 6/8 and more like previous EKG's  ?incomplete bundle branch block  Assessment and Plan: Anthonny Schiller is a 57 y.o. year  old male with SOB for a year or more, progressively worse for the past 4-6 months. Appears stable without acute complaints 6/9.  #SOB - Suspect COPD +/- true exacerbation; strongly doubt PE with no other s/s or risk factors other than smoking  -CXR here with no acute process -continue prednisone 60 mg daily for 5 days (last day 6/12)  -added Spiriva daily, likely will continue at discharge  -pt has never had formal PFT's or controller medication; will need formal evaluation as an outpt  -CM consulted in anticipation of discharge on inhaled medication with no insurance and no PCP  -continue albuterol q4 PRN   #Abnormal EKG - initial EKG with non-specific ST changes, slightly more prominent than previous EKG's -repeat EKG 6/9 with slightly less prominent changes, more similar to previous exams -denies chest pain now or previously; negative serum troponins 6/8-6/9 -history not very suspicious for cardiac disease and physical exam not suggestive of frank heart failure  -given risk factors (no PCP, elevated BP, smoking, etc), cardiac cause of symptoms is possible [ ]  will discuss with cardiology re: echo and/or stress testing inpt/outpt (pt without insurance) [ ]  risk stratification labs (TSH, direct LDL, Hb A1c) [ ]  consider starting statin  #Elevated BP - intermittent elevations, 140's-160's/80's-90's -started on HCTZ 6/9 [ ]  f/u while hospitalized and as an outpt  #Low energy - Uncertain etiology, possibly related to ?chronic poor lung function or heart function -could consider thyroid work-up as  an outpt  [ ]  f/u risk stratification labs as above  #Current tobacco use - "stopped" between 1-4 months ago, but "occasionally takes a puff," currently  -consider nicotine patch per preference; pt not requesting [ ]  CSW for smoking cessation counseling and to assist CM as above  [ ]  outpt f/u for further counseling  FEN/GI: cardiac diet, saline lock IV  Prophylaxis: subQ heparin   Disposition:-management as above  -possible discharge home today pending med/PCP arrangements and discussion with cardiology Code Status: Full  Bobbye Morton, MD 10/12/2012, 10:00 AM PGY-1, [facility] Family Medicine FPTS Intern pager: [phone]

## 2012-10-12 NOTE — Progress Notes (Signed)
Utilization review completed.  

## 2012-10-12 NOTE — Progress Notes (Signed)
Pt refused SQ Heparin. Dierdre Highman, RN

## 2012-10-13 ENCOUNTER — Observation Stay (HOSPITAL_COMMUNITY): Payer: Medicaid Other

## 2012-10-13 DIAGNOSIS — J449 Chronic obstructive pulmonary disease, unspecified: Secondary | ICD-10-CM | POA: Diagnosis present

## 2012-10-13 LAB — LIPID PANEL
HDL: 61 mg/dL (ref >39)
LDL Cholesterol: 92 mg/dL (ref 0–99)
Total CHOL/HDL Ratio: 2.7 RATIO
Triglycerides: 58 mg/dL (ref <150)
VLDL: 12 mg/dL (ref 0–40)

## 2012-10-13 MED ORDER — MOMETASONE FURO-FORMOTEROL FUM 200-5 MCG/ACT IN AERO
1.0000 | INHALATION_SPRAY | Freq: Two times a day (BID) | RESPIRATORY_TRACT | Status: DC
  Filled 2012-10-13: qty 8.8

## 2012-10-13 MED ORDER — TECHNETIUM TC 99M SESTAMIBI GENERIC - CARDIOLITE
30.0000 | Freq: Once | INTRAVENOUS | Status: AC | PRN
  Administered 2012-10-13: 30 via INTRAVENOUS

## 2012-10-13 MED ORDER — HYDROCHLOROTHIAZIDE 25 MG PO TABS: 25.0000 mg | ORAL_TABLET | Freq: Every day | ORAL | Status: DC

## 2012-10-13 MED ORDER — MOMETASONE FURO-FORMOTEROL FUM 200-5 MCG/ACT IN AERO: 1.0000 | INHALATION_SPRAY | Freq: Two times a day (BID) | RESPIRATORY_TRACT | Status: DC

## 2012-10-13 MED ORDER — TIOTROPIUM BROMIDE MONOHYDRATE 18 MCG IN CAPS: 18.0000 ug | ORAL_CAPSULE | Freq: Every day | RESPIRATORY_TRACT | Status: DC

## 2012-10-13 MED ORDER — ALBUTEROL SULFATE HFA 108 (90 BASE) MCG/ACT IN AERS: 2.0000 | INHALATION_SPRAY | RESPIRATORY_TRACT | Status: DC | PRN

## 2012-10-13 MED ORDER — TECHNETIUM TC 99M SESTAMIBI GENERIC - CARDIOLITE
10.0000 | Freq: Once | INTRAVENOUS | Status: AC | PRN
  Administered 2012-10-13: 10 via INTRAVENOUS

## 2012-10-13 NOTE — Discharge Summary (Signed)
Family Medicine Teaching [place] Discharge Summary  Patient name: Gavin Kane Medical record number: [id number] Date of birth: 10-30-1955 Age: 57 y.o. Gender: male Date of Admission: 10/11/2012  Date of Discharge: 10/13/2012 Admitting Physician: Gavin Living, MD  Primary Care Provider: No PCP Per Patient --> to be Dr. Casper Kane of [facility]  Indication for Hospitalization: shortness of breath, labile EKG changes Discharge Diagnoses:  COPD (new diagnosis) Shortness of breath Elevated BP, likely HTN Nonspecific ST-T changes  Consultations: cardiology (Dr. Donnie Kane), respiratory therapy (for formal PFT's)  Significant Labs and Imaging:   Recent Labs Lab 10/11/12 1333 10/11/12 1843  WBC 7.6 10.7*  HGB 14.4 14.5  HCT 41.8 42.4  PLT 207 228    Recent Labs Lab 10/11/12 1333 10/11/12 1843  NA 138  --   K 3.9  --   CL 107  --   CO2 25  --   GLUCOSE 87  --   BUN 16  --   CREATININE 1.02 0.89  CALCIUM 9.0  --     Recent Labs Lab 10/11/12 1843 10/11/12 2350 10/12/12 0410  TROPONINI <0.30 <0.30 <0.30   Pro-BNP 6/10: <5.0  Hb A1c 6/9: 5.5 TSH 6/9: 0.900 Lipid panel 6/10: TG 58, LDL 92, HDL 61  CXR 6/8 @1408  Findings: Heart size is normal. Lungs are free of focal  consolidations and pleural effusions. Visualized osseous structures  have a normal appearance.  IMPRESSION:  Negative exam.  Procedures: 2D Echocardiogram 6/9 Study Conclusions Left ventricle: The cavity size was normal. Wall thickness was increased increased in a pattern of mild to moderate LVH. Systolic function was vigorous. The estimated ejection fraction was in the range of 65% to 70%. Wall motion was normal; there were no regional wall motion abnormalities. Doppler parameters are consistent with abnormal left ventricular relaxation (grade 1 diastolic dysfunction).  Stress Myoview 6/10 Findings: Exam quality is good. Myocardial SPECT shows uniform  distribution of radiopharmaceutical  activity through the left  ventricular myocardium on both stress and rest images. No  reversible or fixed myocardial perfusion defects are identified.  Left ventricular wall motion study shows no evidence of regional  wall motion abnormality.  Estimated left ventricular ejection fraction is 67%.  IMPRESSION:  1. Negative. No evidence of exercise-induced myocardial ischemia.  2. No evidence of regional left ventricular wall motion  abnormality.  3. Calculated left ejection fraction of 67%.  Brief Hospital Course: Gavin Kane is a 57 y.o. year old male with SOB for a year or more, progressively worse for the past 4-6 months. Appears stable without acute complaints 6/9 and 6/10, doing well on medications started this admission (Spiriva, HCTZ, Dulera). See below by problem list for details.  #SOB - Suspect COPD +/- true exacerbation; strongly doubt PE with no other s/s or risk factors other than smoking  -CXR here with no acute process  -PFT results informally read, suggestive of COPD (FEV1 and FEV1/FVC around 50% predicted)  -started on prednisone 60 mg daily for 5 days (last day 6/12)  -started Spiriva daily 6/9 with Dulera added 6/10 --> continued at discharge, given inhalers to take home -CM consulted for medication assistance; provided pt with information/counseling (see also f/u issues below) -ordered for albuterol q4 PRN (pt did not require PRN dosing) --> inhaler given to take home  #Cardiac  -cardiology formally consulted 6/9; EKG changes described as "labile" per Dr. Donnie Kane   -nonspecific ST-T changes, present on prior tracings  -some ST depression on initial EKG in the ED,  improved AM 6/9 -2D echo with EF 65-70% and grade 1 diastolic dysfunction  -pt denies chest pain now or previously; negative serum troponins 6/8-6/9  -could consider starting statin (LDL 92)  -pt had exercise tolerance testing and myoview 6/10   -exercise testing read as "equivocal" by Dr. Donnie Kane; no definite  induced ischemia  -Myoview radiologist read negative for exercise-induced ischemia  #Elevated BP - intermittent elevations, 140's-160's/80's-90's -pt had improvement improvement to 120's/80's with HCTZ 12.5 started 6/9  -Rx provided at discharge for HCTZ 25 mg daily  #Low energy - Uncertain etiology, possibly related to ?chronic poor lung function or heart function  -TSH and A1c both normal 6/9  -will need outpt follow-up  #Current tobacco use - "stopped" between 1-4 months ago, but "occasionally takes a puff," currently  -pt counseled on cessation before discharge and provided with 1-800-QUIT-NOW number -will need f/u as an outpt  Discharge Medications:    Medication List    TAKE these medications       albuterol 108 (90 BASE) MCG/ACT inhaler  Commonly known as:  PROVENTIL HFA;VENTOLIN HFA  Inhale 2 puffs into the lungs every 4 (four) hours as needed for wheezing or shortness of breath.     hydrochlorothiazide 25 MG tablet  Commonly known as:  HYDRODIURIL  Take 1 tablet (25 mg total) by mouth daily.     mometasone-formoterol 200-5 MCG/ACT Aero  Commonly known as:  DULERA  Inhale 1 puff into the lungs 2 (two) times daily.     predniSONE 20 MG tablet  Commonly known as:  DELTASONE  Take 3 tablets (60 mg total) by mouth daily.     tiotropium 18 MCG inhalation capsule  Commonly known as:  SPIRIVA  Place 1 capsule (18 mcg total) into inhaler and inhale daily.       Disposition: discharge home  Issues for Follow Up:  1. COPD - New diagnosis for pt; formal PFT's done in the hospital, with final report pending at time of discharge (FEV1 and FEV1/FVC both ~50% predicted). Pt discharged on Dulera, albuterol, and Spiriva; likely will require medication assistance going forward. Please assess for any improvement in SOB symptoms.  2. HTN - As of yet untreated, as above. Started HCTZ this admission with some improvement, discharged with 25 mg per day dosing. Could consider addition  of a second agent such as ACE as an outpt.  3. Lipid panel - Risk stratification included lipid panel with LDL 92. Given risk factors such as smoking, HTN untreated prior to this hospitalization, etc, could consider starting a stain as an outpt (?medium-intensity such as Lipitor 40 daily).  4. Insurance concerns/PCP follow-up - Pt does not have insurance and is in the process of applying for Medicaid. Would benefit from applying for the orange card through our clinic, as well. Pt is to establish care with Dr. Casper Kane as his PCP after initial hospital follow-up.  5. Cardiac work-up - Generally negative for exercise-induced ischemia as above. No specific follow-up per Dr. Donnie Kane at discharge; depending on further symptoms/outpt evaluations, could consider referral to Dr. Donnie Kane to establish an outpt relationship, once pt finishes applying for Medicaid and/or the orange card through our clinic.  Outstanding Results: none  Discharge Instructions: Please refer to Patient Instructions section of EMR for full details.  Patient was counseled important signs and symptoms that should prompt return to medical care, changes in medications, dietary instructions, activity restrictions, and follow up appointments.       Follow-up Information  Schedule an appointment as soon as possible for a visit with use resource guide .      Follow up with [facility], MD On 10/21/2012. (Appointment at 2:45 PM)    Contact information:   [address] [place] [address] [phone]      Discharge Condition: stable  [address] [place], MD 10/13/2012, 5:11 PM

## 2012-10-13 NOTE — ED Provider Notes (Signed)
Medical screening examination/treatment/procedure(s) were performed by non-physician practitioner and as supervising physician I was immediately available for consultation/collaboration.   Loren Racer, MD 10/13/12 Earle Gell

## 2012-10-13 NOTE — Progress Notes (Signed)
FMTS Attending Admission Note: Nicolette Bang Pager [phone] I  have seen and examined this patient, reviewed their chart. I have discussed this patient with the resident. I agree with the resident's findings, assessment and care plan.  Patient denies SOB this morning,feels well especially when not exerting,denies chest pain or tightness. He is scheduled for stress test this morning. Physical exam benign. SOB likely COPD,patient stated he still smokes about 14 sticks of cigarette or less daily. Agree with PFT and stress test. If stress test normal and cleared by cardiologist may D/C home today.

## 2012-10-13 NOTE — Progress Notes (Signed)
Family Medicine Teaching Service Daily Progress Note Intern Pager: [phone]  Patient name: Gavin Kane Medical record number: [id number] Date of birth: 1956-03-12 Age: 57 y.o. Gender: male  Primary Care Provider: No PCP Per Patient  Subjective: Pt seen in respiratory therapy this morning, down for PFT's. Feeling well, no active SOB or chest pain. Did well with Spiriva yesterday. No concerns.  Objective: Temp:  [97.7 F (36.5 C)-98.3 F (36.8 C)] 98 F (36.7 C) (06/10 0508) Pulse Rate:  [58-69] 58 (06/10 0508) Resp:  [18] 18 (06/10 0508) BP: (122-154)/(66-128) 123/81 mmHg (06/10 0508) SpO2:  [95 %-100 %] 96 % (06/10 0508) Weight:  [145 lb 12.8 oz (66.134 kg)] 145 lb 12.8 oz (66.134 kg) (06/10 1308) Exam: General: non-toxic appearing adult male in NAD on RA Cardiovascular: RRR, no murmur appreciated  Respiratory: CTAB, no wheezes, comfortable work of breathing  Abdomen: soft, nontender, BS+  Extremities: warm, well-perfused; no swelling/redness/tenderness or pitting edema of LE bilaterally Skin: chronic dry skin/scaling changes to bilat LE, but otherwise warm, dry, intact without frank rash  Laboratory:  Recent Labs Lab 10/11/12 1333 10/11/12 1843  WBC 7.6 10.7*  HGB 14.4 14.5  HCT 41.8 42.4  PLT 207 228    Recent Labs Lab 10/11/12 1333 10/11/12 1843  NA 138  --   K 3.9  --   CL 107  --   CO2 25  --   BUN 16  --   CREATININE 1.02 0.89  CALCIUM 9.0  --   GLUCOSE 87  --    Imaging/Diagnostic Tests: CXR, 6/8 @1408 : normal chest xray without acute findings EKG 6/8 @1353 : non-specific ST changes, slightly more prominent than previous EKG's EKG 6/9 @0857 : non-specific ST changes remain, less prominent than 6/8 and more like previous EKG's  ?incomplete bundle branch block  Assessment and Plan: Gavin Kane is a 57 y.o. year old male with SOB for a year or more, progressively worse for the past 4-6 months. Appears stable without acute complaints 6/9.  #SOB - Suspect  COPD +/- true exacerbation; strongly doubt PE with no other s/s or risk factors other than smoking  -CXR here with no acute process -PFT results informally read, suggestive of COPD (FEV1 and FEV1/FVC around 50% predicted) -continue prednisone 60 mg daily for 5 days (last day 6/12)  -started Spiriva daily 6/9; likely will continue at discharge, CM consulted for med assistance -continue albuterol q4 PRN  #Cardiac  - cardiology formally consulted 6/9, labile EKG changes per Dr. Donnie Aho -2D echo with EF 65-70% and grade 1 diastolic dysfunction -denies chest pain now or previously; negative serum troponins 6/8-6/9 [ ]  follow up stress myoview and further recs per cardiology [ ]  consider starting statin (LDL 92)  #Elevated BP - intermittent elevations, 140's-160's/80's-90's, improvement to 120's/80's with HCTZ started 6/9 [ ]  continue to monitor while hospitalized and f/u as an outpt  #Low energy - Uncertain etiology, possibly related to ?chronic poor lung function or heart function -TSH and A1c both normal 6/9 [ ]  will f/u cardiology recs and treat likely COPD as above  #Current tobacco use - "stopped" between 1-4 months ago, but "occasionally takes a puff," currently  -consider nicotine patch per preference; pt not requesting [ ]  CSW for smoking cessation counseling and to assist CM as above  [ ]  outpt f/u for further counseling  FEN/GI: cardiac diet, saline lock IV  Prophylaxis: subQ heparin  Disposition:-management as above  -discharge planning pending stress myoview and cardiology rec's Code Status: Full  Bobbye Morton, MD 10/13/2012, 9:19 AM PGY-1, [facility] Family Medicine FPTS Intern pager: [phone]

## 2012-10-13 NOTE — Progress Notes (Signed)
PFT completed. Unconfirmed results placed in Shadow Chart. 

## 2012-10-14 ENCOUNTER — Encounter (HOSPITAL_COMMUNITY): Payer: Self-pay | Admitting: Family Medicine

## 2012-10-14 NOTE — Discharge Summary (Signed)
FMTS Attending Admission Note: Gavin Petion,MD Pager [phone] I  have seen and examined this patient, reviewed their chart. I have discussed this patient with the resident. I agree with the resident's findings, assessment and care plan.  

## 2012-10-21 ENCOUNTER — Ambulatory Visit (INDEPENDENT_AMBULATORY_CARE_PROVIDER_SITE_OTHER): Payer: Medicaid Other | Admitting: Family Medicine

## 2012-10-21 ENCOUNTER — Encounter: Payer: Self-pay | Admitting: Family Medicine

## 2012-10-21 VITALS — BP 120/70 | HR 72 | Ht 65.0 in | Wt 145.2 lb

## 2012-10-21 DIAGNOSIS — R030 Elevated blood-pressure reading, without diagnosis of hypertension: Secondary | ICD-10-CM

## 2012-10-21 DIAGNOSIS — J449 Chronic obstructive pulmonary disease, unspecified: Secondary | ICD-10-CM

## 2012-10-21 MED ORDER — TIOTROPIUM BROMIDE MONOHYDRATE 18 MCG IN CAPS: 18.0000 ug | ORAL_CAPSULE | Freq: Every day | RESPIRATORY_TRACT | Status: DC

## 2012-10-21 MED ORDER — ALBUTEROL SULFATE HFA 108 (90 BASE) MCG/ACT IN AERS: 2.0000 | INHALATION_SPRAY | RESPIRATORY_TRACT | Status: DC | PRN

## 2012-10-21 MED ORDER — MOMETASONE FURO-FORMOTEROL FUM 200-5 MCG/ACT IN AERO: 1.0000 | INHALATION_SPRAY | Freq: Two times a day (BID) | RESPIRATORY_TRACT | Status: DC

## 2012-10-21 NOTE — Assessment & Plan Note (Signed)
Normotensive on no medications.  Will wait to restart HCTZ. F/u with PCP in 1 month.

## 2012-10-21 NOTE — Progress Notes (Signed)
S: Pt comes in today for hospital follow up.  Was admitted 6/8-6/10 for COPD exacerbation-- COPD was new diagnosis.  He was started on Spiriva, Dulera, and albuterol.  He is now s/p prednisone course.  Since d/c home, he feels like he is doing better, breathing is more comfortable. No fevers/chills, no cough, no sputum.  He is not using his Spiriva daily but is using his Dulera BID.  He last needed albuterol this AM, has been needing it since he ran out of the Spiriva.  He ran out of the Spiriva a few days ago and lost the Rx, so he needs a new one.  Is getting SOB with walking up stairs.  Also with new diagnosis of HTN-- started on HCTZ 25mg , which he has not taken since he was d/c'ed home from the hospital.  Has not been able to get the Rx filled. No CP, SOB, LEE, dizziness.   He quit smoking 3 months ago.   ROS: Per HPI  History  Smoking status  . Former Smoker -- 0.75 packs/day for 40 years  . Types: Cigarettes  Smokeless tobacco  . Former Neurosurgeon  . Quit date: 07/21/2012    O:  Filed Vitals:   10/21/12 1442  BP: 120/70  Pulse: 72    Gen: NAD CV: RRR, no murmur Pulm: CTA bilat, no wheezes or crackles Ext: warm, no LEE   A/P: 57 y.o. male p/w HFU -See problem list -f/u in 1 month with PCP

## 2012-10-21 NOTE — Patient Instructions (Addendum)
It was nice to meet you today.  I have re-printed your COPD medications.  [facility] to have them filled through the [facility] program-- this will take a number of weeks.  I want you to meet with Britta Mccreedy to find out if you qualify for the orange card.  She will have a list of things you need to bring with you to that appointment.  Your blood pressure is normal today, so I will wait to restart the blood pressure medicine.  Come back to see Dr. Casper Harrison in about 1 month, sooner for issues or problems.

## 2012-10-21 NOTE — Assessment & Plan Note (Signed)
New Rx's printed.  Out of Spiriva, no samples but will give new Rx to take to MAP along with Dulera and albuterol Rx's.  F/u 1 month.

## 2012-10-23 ENCOUNTER — Telehealth: Payer: Self-pay | Admitting: General Practice

## 2012-10-23 NOTE — Telephone Encounter (Signed)
LM that I was returning patient's phone call.  Randie Bloodgood, Darlyne Russian, CMA

## 2012-10-23 NOTE — Telephone Encounter (Signed)
Pt went to get prescription for medicines. He went to outpatient pharmacy and the medicine was $435 for inhaler. [facility] and said there was no one there to help him. He wants to know where to go next.   Please advise

## 2012-11-16 ENCOUNTER — Ambulatory Visit: Payer: Self-pay | Admitting: Family Medicine

## 2012-11-27 ENCOUNTER — Ambulatory Visit (INDEPENDENT_AMBULATORY_CARE_PROVIDER_SITE_OTHER): Payer: No Typology Code available for payment source | Admitting: Family Medicine

## 2012-11-27 ENCOUNTER — Encounter: Payer: Self-pay | Admitting: Family Medicine

## 2012-11-27 VITALS — BP 130/84 | HR 70 | Ht 65.0 in | Wt 147.0 lb

## 2012-11-27 DIAGNOSIS — J449 Chronic obstructive pulmonary disease, unspecified: Secondary | ICD-10-CM

## 2012-11-27 DIAGNOSIS — Z7189 Other specified counseling: Secondary | ICD-10-CM

## 2012-11-27 DIAGNOSIS — R5383 Other fatigue: Secondary | ICD-10-CM

## 2012-11-27 DIAGNOSIS — Z716 Tobacco abuse counseling: Secondary | ICD-10-CM | POA: Insufficient documentation

## 2012-11-27 DIAGNOSIS — R030 Elevated blood-pressure reading, without diagnosis of hypertension: Secondary | ICD-10-CM

## 2012-11-27 DIAGNOSIS — R5382 Chronic fatigue, unspecified: Secondary | ICD-10-CM

## 2012-11-27 DIAGNOSIS — F17200 Nicotine dependence, unspecified, uncomplicated: Secondary | ICD-10-CM

## 2012-11-27 DIAGNOSIS — J441 Chronic obstructive pulmonary disease with (acute) exacerbation: Secondary | ICD-10-CM

## 2012-11-27 MED ORDER — ALBUTEROL SULFATE HFA 108 (90 BASE) MCG/ACT IN AERS: 2.0000 | INHALATION_SPRAY | RESPIRATORY_TRACT | Status: DC | PRN

## 2012-11-27 MED ORDER — TIOTROPIUM BROMIDE MONOHYDRATE 18 MCG IN CAPS: 18.0000 ug | ORAL_CAPSULE | Freq: Every day | RESPIRATORY_TRACT | Status: DC

## 2012-11-27 MED ORDER — PREDNISONE 50 MG PO TABS: 50.0000 mg | ORAL_TABLET | Freq: Every day | ORAL | Status: DC

## 2012-11-27 MED ORDER — MOMETASONE FURO-FORMOTEROL FUM 200-5 MCG/ACT IN AERO: 2.0000 | INHALATION_SPRAY | Freq: Two times a day (BID) | RESPIRATORY_TRACT | Status: DC

## 2012-11-27 NOTE — Patient Instructions (Addendum)
Thank you for coming in, today! Your blood pressure is okay, today, so I don't think we need to start you on any medications for this, at this time. Take your medications as below:     Dulera (mometasone-formoterol) - Take 2 puffs in the morning, 2 puffs in the evening, EVERY DAY even if you feel well     Spiriva (tiotropium, tablet inhaler) - Take once every morning, EVERY DAY even if you feel well     Albuterol ('rescue' inhaler) - Take 2 puffs every four hours as needed. I will prescribe a short course of prednisone to take by mouth. This will be only for 5 days. Make an appointment to see me in about 1-2 months. Please feel free to call with any questions or concerns at any time, at [phone]. --Dr. Casper Harrison

## 2012-12-01 DIAGNOSIS — R5382 Chronic fatigue, unspecified: Secondary | ICD-10-CM | POA: Insufficient documentation

## 2012-12-01 NOTE — Assessment & Plan Note (Signed)
A: Intermittent, possibly worsened by respiratory status. Not on HCTZ since discharge from last hospital admission >1 month ago. Normotensive at follow-up, 130/84, today.  P: No Rx, today. Will monitor and consider restarting HCTZ in the future. Follow-up in 1-2 months.

## 2012-12-01 NOTE — Assessment & Plan Note (Signed)
Pt reports quitting but also endorses "occasional" cigarette use but is vague on history. Counseled briefly at this visit. Will also refer to health coach. Will reiterate total cessation advice at f/u.

## 2012-12-01 NOTE — Assessment & Plan Note (Signed)
Likely related to generally poor lung function and inadvertent poor compliance with COPD medications. Possible component of poor heart function, but no evidence for fluid overload or frank heart failure on exam or recent labwork from admission a little over 1 month ago. Recent echo did suggest possible diastolic dysfunction. Thyroid function normal in June 2014 so will defer recheck, for now. Reiterated proper COPD medication use and treating for mild exacerbation with steroid burst. Will follow up as needed.

## 2012-12-01 NOTE — Progress Notes (Signed)
  Subjective:    Patient ID: Gavin Kane, male    DOB: 05/08/1955, 57 y.o.   MRN: [id number]  HPI: Pt presents for f/u of COPD, HTN, and low energy.   COPD: reports taking Dulera and albuterol both "as needed"; somewhat vague about which inhaler is used when  -states he went to the health department MAP office yesterday but was late to his appt and needs new Rx's to take back today  -has occasional cough but no production, still short of breath with exertion, but has not been taking medications appropriately, as above  -no fever, chills, chest pain, or frank difficulty breathing at rest  HTN: diagnosed during recent hospital stay, on HCTZ during that time; Rx at discharge, but never filled and has not been taking anything  -pt saw Dr. Fara Boros for hospital follow-up and was normotensive at that time, so she did not prescribe any medications  -no chest pain, headache, change in vision, palpitations, leg swelling  Low energy/fatigue: chronic in nature, "about the same" since before starting COPD medications without much change  -reports energy level fluctuates day to day, sometimes "hard to get up steps" without resting  -increased SOB/fatigue when exerting himself, recently, last week or so "even more than normal"  -normal thyroid function to his knowledge but unsure when this was last checked  -denies skin/hair changes, no unexpected weight gain or loss, no diarrhea or constipation  Pt is a current "occasional" smoker. States he has quit but will have a puff of a single cigarette "now and then" In addition to the above documentation, pt's PMH, surgical history, FH, and SH all reviewed and updated where appropriate in the EMR. I have also reviewed and updated the pt's allergies and current medications as appropriate.  Review of Systems: Otherwise, full 12-system ROS was reviewed and all negative.     Objective:   Physical Exam BP 130/84  Pulse 70  Ht 5\' 5"  (1.651 m)  Wt 147 lb (66.679  kg)  BMI 24.46 kg/m2  SpO2 95% Gen: well-appearing adult male in NAD HEENT: [place]/AT, sclerae/conjunctivae clear, no lid lag, EOMI, PERRLA   MMM, posterior oropharynx clear, no cervical lymphadenopathy  neck supple with full ROM, no masses appreciated; thyroid not enlarged  Cardio: RRR, no murmur appreciated; distal pulses intact/symmetric Pulm: CTAB, normal WOB; mildly diminished breath sounds throughout but no frank wheezes Abd: soft, nondistended, BS+, no HSM Ext: warm/well-perfused, no cyanosis/clubbing/edema MSK: strength 5/5 in all four extremities, no frank joint deformity/effusion;   normal ROM to all four extremities with no point muscle/bony tenderness in spine Neuro/Psych: alert/oriented, sensation grossly intact; normal gait/balance  mood euthymic with congruent affect     Assessment & Plan:

## 2012-12-01 NOTE — Assessment & Plan Note (Signed)
A: Relatively new diagnosis for pt, not currently taking Dulera appropriately and needs refills of all there medications (out of albuterol and Spiriva, low on Dulera). Possible mild exacerbation currently with increased SOB from baseline and diffusely diminished air movement on exam. Likely component of chronic fatigue, as well.  P: Paper Rx given to take to [facility] at the [facility] for refills for all three medications. Counseled extensively on proper scheduling/use of inhaled medications. Short Rx for prednisone, 50 mg daily for 5 days. F/u in about 1-2 months.

## 2012-12-23 ENCOUNTER — Telehealth: Payer: Self-pay | Admitting: Home Health Services

## 2012-12-23 NOTE — Progress Notes (Signed)
LM for Charleton to call me back.

## 2012-12-23 NOTE — Telephone Encounter (Signed)
LM for Qasim to call me back to discuss if he is interested in smoking cessation and health coaching services.  Pixie Burgener

## 2012-12-28 ENCOUNTER — Ambulatory Visit: Payer: No Typology Code available for payment source | Admitting: Family Medicine

## 2013-01-11 ENCOUNTER — Encounter: Payer: Self-pay | Admitting: Family Medicine

## 2013-01-11 ENCOUNTER — Ambulatory Visit (INDEPENDENT_AMBULATORY_CARE_PROVIDER_SITE_OTHER): Payer: Medicaid Other | Admitting: Family Medicine

## 2013-01-11 VITALS — BP 128/80 | HR 54 | Temp 98.2°F | Ht 64.0 in | Wt 143.0 lb

## 2013-01-11 DIAGNOSIS — J449 Chronic obstructive pulmonary disease, unspecified: Secondary | ICD-10-CM

## 2013-01-11 MED ORDER — MOMETASONE FURO-FORMOTEROL FUM 200-5 MCG/ACT IN AERO: 2.0000 | INHALATION_SPRAY | Freq: Two times a day (BID) | RESPIRATORY_TRACT | Status: DC

## 2013-01-11 MED ORDER — ALBUTEROL SULFATE HFA 108 (90 BASE) MCG/ACT IN AERS: 2.0000 | INHALATION_SPRAY | RESPIRATORY_TRACT | Status: DC | PRN

## 2013-01-11 MED ORDER — TIOTROPIUM BROMIDE MONOHYDRATE 18 MCG IN CAPS: 18.0000 ug | ORAL_CAPSULE | Freq: Every day | RESPIRATORY_TRACT | Status: DC

## 2013-01-11 NOTE — Assessment & Plan Note (Signed)
A: Pt out of all meds for about 10 days (Dulera, Spiriva, albuterol). Missed appointment at [facility], where he gets his medications; he is uncertain if he needed a visit with me today for refills or not. Possible component of sleep apnea, given nighttime symptoms and chronic daytime fatigue. No evidence for pneumonia or infection contributing to symptoms, and doubt current symptoms reflect frank exacerbation.  P: Paper Rx's given to take to [facility], though pt should have plenty of refills there and should just need to go pick them up. Pt to f/u in 1 month (or sooner if symptoms do not improve on medications). Will need to consider sleepiness scale questionnaire and/or referral for sleep study, if daytime fatigue does not improve.

## 2013-01-11 NOTE — Patient Instructions (Signed)
Thank you for coming in, today! Get your medications filled at the health department. Keep taking the Spiriva once a day, the Dulera twice a day, and the albuterol only as you need it. Ask the people at the health department if you need new prescriptions. If they don't need new prescriptions, you can refill them as you need at the health department. If you have something that you need me to do for you, call me back. Come back to see me in about 1 month. We might consider having you get a sleep study, if the medicines don't help. Please feel free to call with any questions or concerns at any time, at [phone]. --Dr. Casper Harrison

## 2013-01-11 NOTE — Progress Notes (Signed)
  Subjective:    Patient ID: Gavin Kane, male    DOB: [birth year], 57 y.o.   MRN: [id number]  HPI: Pt presents to clinic for SDA for "breathing trouble." Pt is out of medications for about 10 days, Spiriva and inhalers (albuterol, Dulera); he was on a trip out of town and did not make it back into town to make his appointments here and at the health department. He states he has been more short of breath and with more congestion, especially at night. Pt has had some chills, but no fevers. His energy has also been low, which has also been ongoing. He denies cough, production of sputum. He does not snore at night, but states his breathing sometimes sounds like a "hissing" sound. He denies personal or family history of sleep apnea. He denies waking up with sensation of choking or inability to breathe.  Pt is a previous smoker; he stopped "6-7 months ago," about the time he was admitted to the hospital and diagnosed with COPD.  Review of Systems: As above.     Objective:   Physical Exam BP 128/80  Pulse 54  Temp(Src) 98.2 F (36.8 C) (Oral)  Ht 5\' 4"  (1.626 m)  Wt 143 lb (64.864 kg)  BMI 24.53 kg/m2 Gen: well-appearing adult male in NAD, speaking in full sentences Pulm: breath sounds diminished throughout, but fair/equal bilateral air movement and no increased WOB Cardio: RRR, no murmur HEENT: [place]/AT, no cervical lymphadenopathy, post oropharynx and nasal mucosae clear, no ear/eye/nose drainage     Assessment & Plan:

## 2013-04-06 ENCOUNTER — Encounter (HOSPITAL_COMMUNITY): Payer: Self-pay | Admitting: Emergency Medicine

## 2013-04-06 ENCOUNTER — Emergency Department (HOSPITAL_COMMUNITY)
Admission: EM | Admit: 2013-04-06 | Discharge: 2013-04-06 | Disposition: A | Discharge status: Home / Self Care | Payer: Medicaid Other | Attending: Emergency Medicine | Admitting: Emergency Medicine

## 2013-04-06 ENCOUNTER — Emergency Department (HOSPITAL_COMMUNITY): Payer: Medicaid Other

## 2013-04-06 DIAGNOSIS — Z79899 Other long term (current) drug therapy: Secondary | ICD-10-CM | POA: Insufficient documentation

## 2013-04-06 DIAGNOSIS — J449 Chronic obstructive pulmonary disease, unspecified: Secondary | ICD-10-CM

## 2013-04-06 DIAGNOSIS — R062 Wheezing: Secondary | ICD-10-CM | POA: Insufficient documentation

## 2013-04-06 DIAGNOSIS — R1110 Vomiting, unspecified: Secondary | ICD-10-CM

## 2013-04-06 DIAGNOSIS — I10 Essential (primary) hypertension: Secondary | ICD-10-CM | POA: Insufficient documentation

## 2013-04-06 DIAGNOSIS — E860 Dehydration: Secondary | ICD-10-CM | POA: Insufficient documentation

## 2013-04-06 DIAGNOSIS — Z87891 Personal history of nicotine dependence: Secondary | ICD-10-CM | POA: Insufficient documentation

## 2013-04-06 DIAGNOSIS — R197 Diarrhea, unspecified: Secondary | ICD-10-CM | POA: Insufficient documentation

## 2013-04-06 DIAGNOSIS — J441 Chronic obstructive pulmonary disease with (acute) exacerbation: Secondary | ICD-10-CM | POA: Insufficient documentation

## 2013-04-06 DIAGNOSIS — R9431 Abnormal electrocardiogram [ECG] [EKG]: Secondary | ICD-10-CM | POA: Insufficient documentation

## 2013-04-06 LAB — CBC WITH DIFFERENTIAL/PLATELET
Eosinophils Relative: 1 % (ref 0–5)
HCT: 44 % (ref 39.0–52.0)
Hemoglobin: 14.6 g/dL (ref 13.0–17.0)
Lymphocytes Relative: 31 % (ref 12–46)
Lymphs Abs: 2.5 10*3/uL (ref 0.7–4.0)
MCV: 85.3 fL (ref 78.0–100.0)
Monocytes Absolute: 0.8 10*3/uL (ref 0.1–1.0)
RBC: 5.16 MIL/uL (ref 4.22–5.81)
WBC: 8 10*3/uL (ref 4.0–10.5)

## 2013-04-06 LAB — COMPREHENSIVE METABOLIC PANEL
ALT: 7 U/L (ref 0–53)
CO2: 25 mEq/L (ref 19–32)
Calcium: 9.2 mg/dL (ref 8.4–10.5)
Creatinine, Ser: 0.92 mg/dL (ref 0.50–1.35)
GFR calc Af Amer: >90 mL/min (ref >90)
GFR calc non Af Amer: >90 mL/min (ref >90)
Glucose, Bld: 93 mg/dL (ref 70–99)
Total Bilirubin: 0.3 mg/dL (ref 0.3–1.2)

## 2013-04-06 LAB — URINALYSIS, ROUTINE W REFLEX MICROSCOPIC
Nitrite: NEGATIVE
Protein, ur: NEGATIVE mg/dL
Specific Gravity, Urine: 1.033 — ABNORMAL HIGH (ref 1.005–1.030)
Urobilinogen, UA: 2 mg/dL — ABNORMAL HIGH (ref 0.0–1.0)

## 2013-04-06 LAB — POCT I-STAT TROPONIN I

## 2013-04-06 MED ORDER — ALBUTEROL SULFATE HFA 108 (90 BASE) MCG/ACT IN AERS: 2.0000 | INHALATION_SPRAY | RESPIRATORY_TRACT | Status: DC | PRN

## 2013-04-06 MED ORDER — ALBUTEROL SULFATE (5 MG/ML) 0.5% IN NEBU
5.0000 mg | INHALATION_SOLUTION | Freq: Once | RESPIRATORY_TRACT | Status: AC
  Administered 2013-04-06: 5 mg via RESPIRATORY_TRACT
  Filled 2013-04-06: qty 1

## 2013-04-06 MED ORDER — SODIUM CHLORIDE 0.9 % IV BOLUS (SEPSIS)
1000.0000 mL | Freq: Once | INTRAVENOUS | Status: AC
  Administered 2013-04-06: 1000 mL via INTRAVENOUS

## 2013-04-06 MED ORDER — MOMETASONE FURO-FORMOTEROL FUM 200-5 MCG/ACT IN AERO: 2.0000 | INHALATION_SPRAY | Freq: Two times a day (BID) | RESPIRATORY_TRACT | Status: DC

## 2013-04-06 MED ORDER — ONDANSETRON HCL 4 MG/2ML IJ SOLN
4.0000 mg | Freq: Once | INTRAMUSCULAR | Status: AC
  Administered 2013-04-06: 4 mg via INTRAVENOUS
  Filled 2013-04-06: qty 2

## 2013-04-06 MED ORDER — IPRATROPIUM BROMIDE 0.02 % IN SOLN
0.5000 mg | Freq: Once | RESPIRATORY_TRACT | Status: AC
  Administered 2013-04-06: 0.5 mg via RESPIRATORY_TRACT
  Filled 2013-04-06: qty 2.5

## 2013-04-06 NOTE — ED Notes (Signed)
Pt reports having copd and always feels sob but is out of inhaler. Airway intact at triage, ekg being done. Pt also reports possibly eating something bad, having n/v/d since yesterday.

## 2013-04-11 NOTE — ED Provider Notes (Signed)
CSN: [id number]     Arrival date & time 04/06/13  1215 History   First MD Initiated Contact with Patient 04/06/13 1238     Chief Complaint  Patient presents with  . Shortness of Breath  . Emesis  . Diarrhea   (Consider location/radiation/quality/duration/timing/severity/associated sxs/prior Treatment) HPI Comments: 57 yo male with copd, smoking, abnormal ekg hx presents with sob, diarrhea, vomiting since yesterday.  No sick contacts.  SOB similar to previous copd, pt out of albuterol at home.  Non bloody vomiting yesterday improved.  No cp.  No MI hx.  Smoker.   Patient is a 57 y.o. male presenting with shortness of breath, vomiting, and diarrhea. The history is provided by the patient.  Shortness of Breath Associated symptoms: vomiting   Associated symptoms: no abdominal pain, no chest pain, no fever, no headaches, no neck pain and no rash   Emesis Associated symptoms: diarrhea   Associated symptoms: no abdominal pain, no chills and no headaches   Diarrhea Associated symptoms: vomiting   Associated symptoms: no abdominal pain, no chills, no fever and no headaches     Past Medical History  Diagnosis Date  . COPD (chronic obstructive pulmonary disease)   . HTN (hypertension)    Past Surgical History  Procedure Laterality Date  . Hemorrhoid surgery     Family History  Problem Relation Age of Onset  . Cancer      Aunt   History  Substance Use Topics  . Smoking status: Former Smoker -- 0.75 packs/day for 40 years    Types: Cigarettes  . Smokeless tobacco: Former Neurosurgeon    Quit date: 07/21/2012  . Alcohol Use: Yes     Comment: occ    Review of Systems  Constitutional: Negative for fever and chills.  HENT: Negative for congestion.   Eyes: Negative for visual disturbance.  Respiratory: Positive for shortness of breath.   Cardiovascular: Negative for chest pain.  Gastrointestinal: Positive for vomiting and diarrhea. Negative for abdominal pain and blood in stool.   Genitourinary: Negative for dysuria and flank pain.  Musculoskeletal: Negative for back pain, neck pain and neck stiffness.  Skin: Negative for rash.  Neurological: Negative for light-headedness and headaches.    Allergies  Review of patient's allergies indicates no known allergies.  Home Medications   Current Outpatient Rx  Name  Route  Sig  Dispense  Refill  . albuterol (PROVENTIL HFA;VENTOLIN HFA) 108 (90 BASE) MCG/ACT inhaler   Inhalation   Inhale 2 puffs into the lungs every 4 (four) hours as needed for wheezing or shortness of breath.   1 Inhaler   11   . mometasone-formoterol (DULERA) 200-5 MCG/ACT AERO   Inhalation   Inhale 2 puffs into the lungs 2 (two) times daily.   1 Inhaler   11   . tiotropium (SPIRIVA) 18 MCG inhalation capsule   Inhalation   Place 1 capsule (18 mcg total) into inhaler and inhale daily.   30 capsule   12   . albuterol (PROVENTIL HFA;VENTOLIN HFA) 108 (90 BASE) MCG/ACT inhaler   Inhalation   Inhale 2 puffs into the lungs every 4 (four) hours as needed for wheezing or shortness of breath.   1 Inhaler   1   . mometasone-formoterol (DULERA) 200-5 MCG/ACT AERO   Inhalation   Inhale 2 puffs into the lungs 2 (two) times daily.   1 Inhaler   1    BP 121/76  Pulse 66  Temp(Src) 97.9 F (36.6 C) (Oral)  Resp 22  Ht 5\' 5"  (1.651 m)  Wt 130 lb (58.968 kg)  BMI 21.63 kg/m2  SpO2 100% Physical Exam  Nursing note and vitals reviewed. Constitutional: He is oriented to person, place, and time. He appears well-developed and well-nourished.  HENT:  Head: Normocephalic and atraumatic.  Dry mm  Eyes: Conjunctivae are normal. Right eye exhibits no discharge. Left eye exhibits no discharge.  Neck: Normal range of motion. Neck supple. No tracheal deviation present.  Cardiovascular: Normal rate and regular rhythm.   Pulmonary/Chest: Effort normal. He has wheezes (exp bilateral).  Abdominal: Soft. He exhibits no distension. There is no tenderness.  There is no guarding.  Musculoskeletal: He exhibits no edema and no tenderness.  Neurological: He is alert and oriented to person, place, and time.  Skin: Skin is warm. No rash noted.  Psychiatric: He has a normal mood and affect.    ED Course  Procedures (including critical care time) Labs Review Labs Reviewed  URINALYSIS, ROUTINE W REFLEX MICROSCOPIC - Abnormal; Notable for the following:    Specific Gravity, Urine 1.033 (*)    Bilirubin Urine SMALL (*)    Ketones, ur 15 (*)    Urobilinogen, UA 2.0 (*)    All other components within normal limits  CBC WITH DIFFERENTIAL  COMPREHENSIVE METABOLIC PANEL  POCT I-STAT TROPONIN I   Imaging Review No results found.  EKG Interpretation    Date/Time:  Tuesday April 06 2013 12:17:53 EST Ventricular Rate:  75 PR Interval:  156 QRS Duration: 80 QT Interval:  332 QTC Calculation: 370 R Axis:   73 Text Interpretation:  Normal sinus rhythm Biatrial enlargement ST \\T \ T wave abnormality, consider inferolateral ischemia Abnormal ECG Similar T wave inversions on prior EKG Confirmed by Gregory Barrick  MD, Kandance Yano ([id number]) on 04/06/2013 1:02:35 PM            MDM   1. COPD (chronic obstructive pulmonary disease)   2. Diarrhea   3. Vomiting   4. Dehydration    Clinically copd exac with dehydration.  EKG similar T wave inversions to previous, no cp.  Fluids given.  Nebs given Pt improved on recheck.  Albuterol for home. No cp in ED.  Discussed outpt fup for stress test with pcp. Results and differential diagnosis were discussed with the patient. Close follow up outpatient was discussed, patient comfortable with the plan.   Diagnosis: above   Enid Skeens, MD 04/11/13 1116

## 2013-04-19 ENCOUNTER — Telehealth: Payer: Self-pay | Admitting: *Deleted

## 2013-04-19 ENCOUNTER — Ambulatory Visit (INDEPENDENT_AMBULATORY_CARE_PROVIDER_SITE_OTHER): Payer: Medicaid Other | Admitting: Family Medicine

## 2013-04-19 ENCOUNTER — Encounter: Payer: Self-pay | Admitting: Family Medicine

## 2013-04-19 VITALS — BP 135/80 | HR 78 | Temp 98.2°F | Resp 18 | Wt 134.0 lb

## 2013-04-19 DIAGNOSIS — J449 Chronic obstructive pulmonary disease, unspecified: Secondary | ICD-10-CM

## 2013-04-19 DIAGNOSIS — R030 Elevated blood-pressure reading, without diagnosis of hypertension: Secondary | ICD-10-CM

## 2013-04-19 DIAGNOSIS — F17200 Nicotine dependence, unspecified, uncomplicated: Secondary | ICD-10-CM

## 2013-04-19 DIAGNOSIS — Z716 Tobacco abuse counseling: Secondary | ICD-10-CM

## 2013-04-19 DIAGNOSIS — Z7189 Other specified counseling: Secondary | ICD-10-CM

## 2013-04-19 MED ORDER — MOMETASONE FURO-FORMOTEROL FUM 200-5 MCG/ACT IN AERO: 2.0000 | INHALATION_SPRAY | Freq: Two times a day (BID) | RESPIRATORY_TRACT | Status: DC

## 2013-04-19 MED ORDER — ALBUTEROL SULFATE HFA 108 (90 BASE) MCG/ACT IN AERS: 2.0000 | INHALATION_SPRAY | RESPIRATORY_TRACT | Status: DC | PRN

## 2013-04-19 NOTE — Assessment & Plan Note (Signed)
Intermittent elevations, 121/76 in the ED in early December, 135/80 today. Continue to monitor and consider starting HCTZ. Continue to recommend therapeutic lifestyle changes including avoiding added salt, eat more vegetables / fruits, control portion sizes, etc. Continue to monitor at regular f/u.

## 2013-04-19 NOTE — Assessment & Plan Note (Signed)
No longer smoking. Praised quitting and encouraged to stay quit. Will continue to reinforce non-smoking at regular f/u.

## 2013-04-19 NOTE — Assessment & Plan Note (Signed)
A: Out of Dulera and albuterol ("puffers") but doe have Spiriva. No longer has orange card and now has Medicaid. Generally feels well and is without signs/symptoms of exacerbation of COPD at today's visit. Generally feels well.  P: Rx's for Dulera ([place] Medicaid preferred, requiring clinical criteria for which pt is exempt due to diagnosis of COPD) and albuterol refilled. Continue Spiriva. Counseled on proper use of medications. Discussed red flags for when to present to clinic or call 911. F/u in about 3 months.

## 2013-04-19 NOTE — Patient Instructions (Signed)
Thank you for coming in, today!  Everything looks good today. I will send your two inhalers to the pharmacy. Remember, use the [facility] every day twice a day. Only use the albuterol as you need, up to every 4 hours. If you have trouble breathing that your medicines don't help, call or come see me.  Your recent lab work looks good. Your kidney function is good and your blood sugar is normal. You blood pressure looks better today (133/80). Normal for you is <130/80. We will watch your blood pressure when you come back and see if you need to be on medicine. You don't need any different medicines now.  Come back to see me in about 3 months. Please feel free to call with any questions or concerns at any time, at [phone]. --Dr. Casper Harrison

## 2013-04-19 NOTE — Telephone Encounter (Signed)
Patient has been on [facility], but now has Medicaid.  Prior approval for Dulera 200-5 mcg completed via [place] [employer].  Med approved for 04/19/13 - 04/19/14.  Prior approval # [id number].   Gaylene Brooks, RN

## 2013-04-19 NOTE — Progress Notes (Signed)
   Subjective:    Patient ID: Davidjames Blansett, male    DOB: Sep 25, 1955, 57 y.o.   MRN: [id number]  HPI: Pt presents to clinic for general follow-up.  COPD - Pt reports his breathing is "okay" and denies wheezing; wheezing/coughing comes and goes - he is out of his medication, but now has Medicaid and needs Rx's sent to a regular pharmacy  Elevated blood pressure - intermittently high at outpt visits, but never formally diagnosed - never has taken medication for BP - denies headache, chest pain, change in vision  Review of Systems: Otherwise feels well. Recently went to the ED for viral GI symptoms, but these have all improved. Denies fevers / chills, SOB, abdominal pain.     Objective:   Physical Exam BP 135/80  Pulse 78  Temp(Src) 98.2 F (36.8 C) (Oral)  Resp 18  Wt 134 lb (60.782 kg)  SpO2 100% Gen: well-appearing adult male in NAD HEENT: [place]/AT, PERRLA, MMM, TM's clear bilaterally, posterior oropharynx clear Cardio: RRR, no murmur appreciated Pulm: CTAB, equal air movement bilaterally, no wheezes  Diminished sounds at bases bilaterally but normal WOB Abd: soft, nontender, nondistended, BS+ Ext: warm, well-perfused, no cyanosis or edema     Assessment & Plan:

## 2013-05-05 ENCOUNTER — Ambulatory Visit: Payer: Medicaid Other

## 2013-07-01 IMAGING — CR DG CHEST 2V
2 series · 2 of 2 positions shown · non-contrast
Comparison: June 21, 2012.

CLINICAL DATA: Dyspnea.

CHEST - 2 VIEW

[w chest pa]
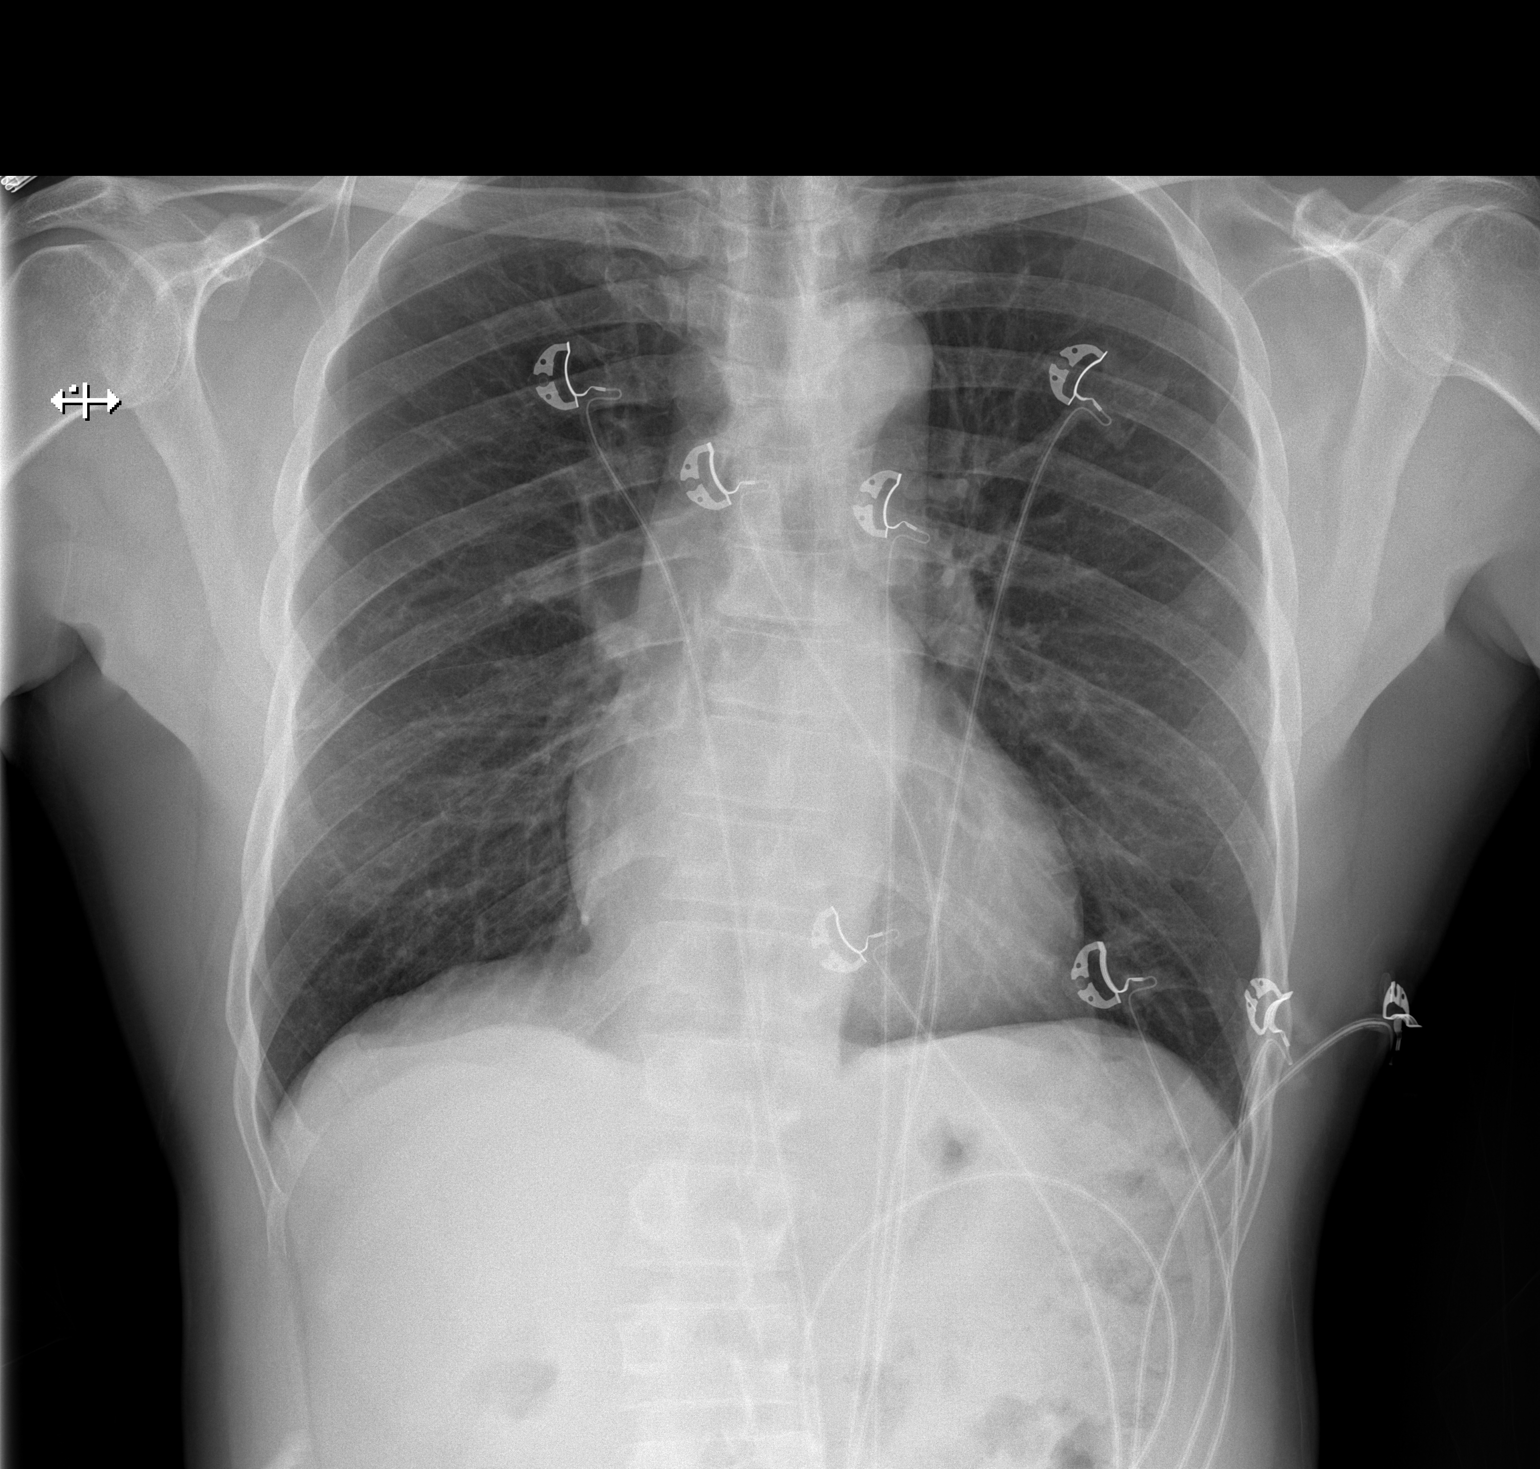

[w chest lat]
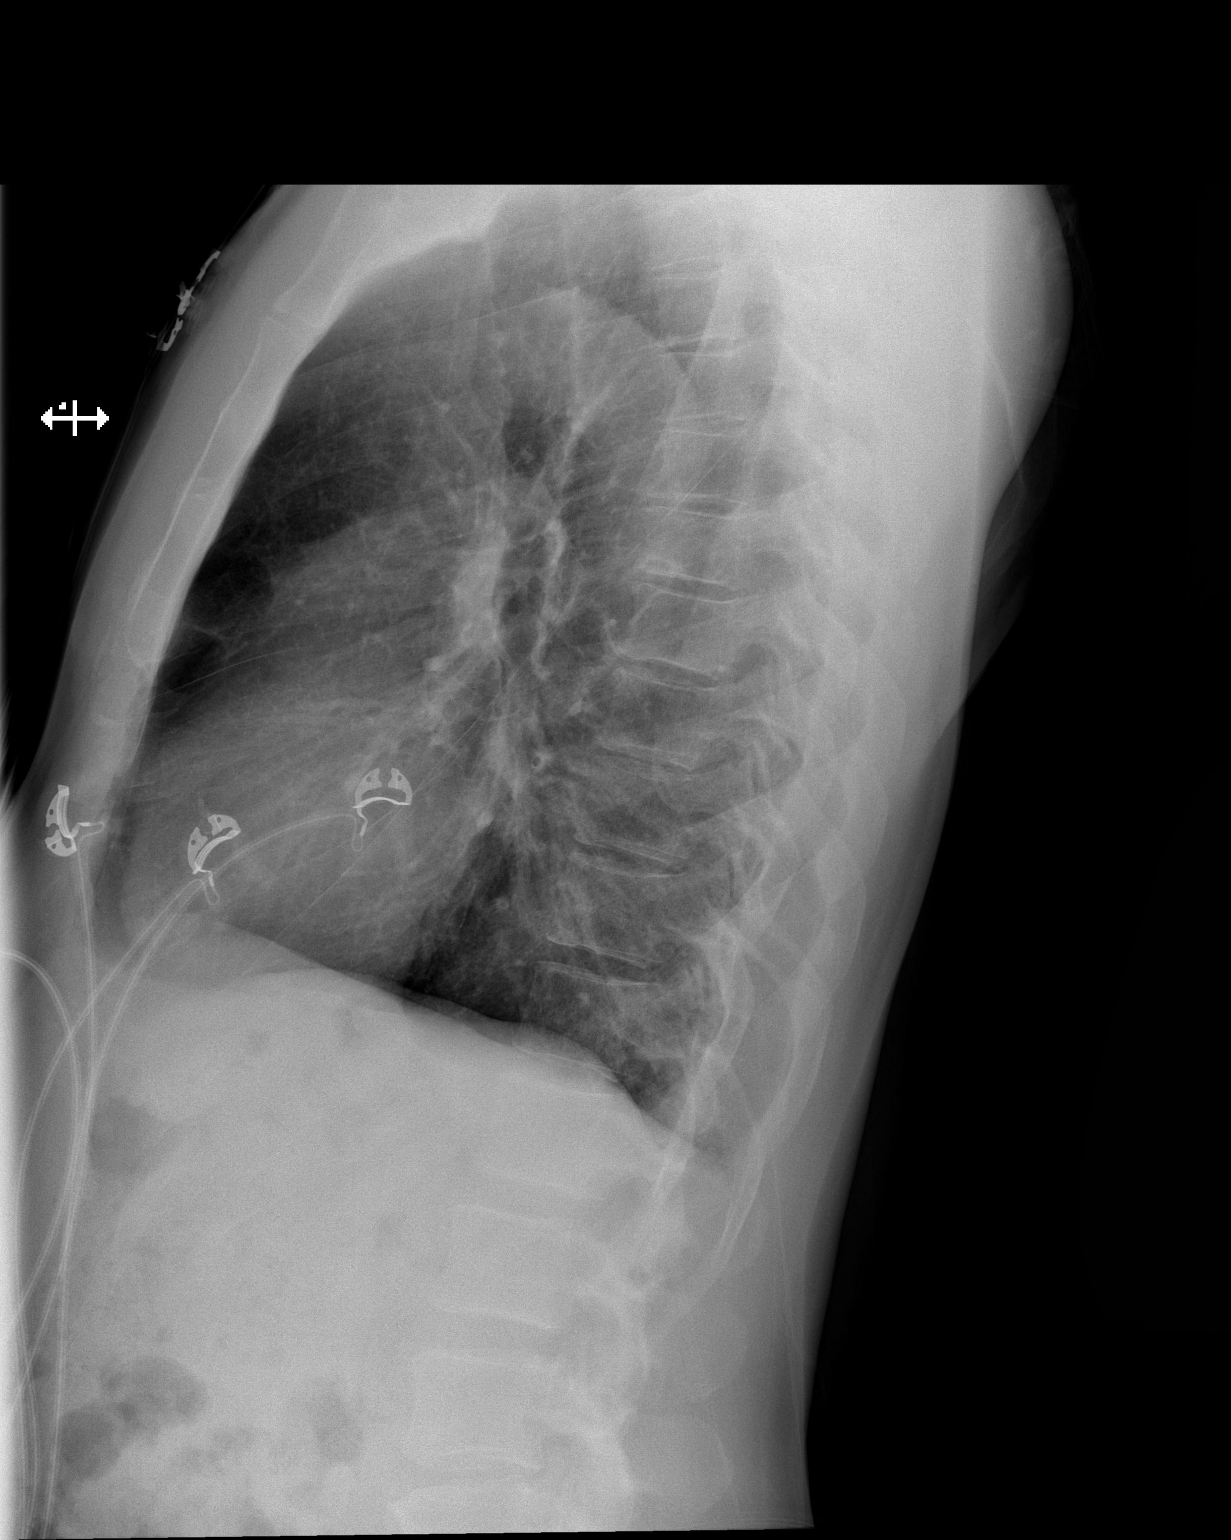

[2 of 2 positions shown; findings below may reference images not displayed]

FINDINGS: Cardiomediastinal silhouette appears normal.  No acute
pulmonary disease is noted.  Bony thorax is intact.  Nodular
density identified in the right mid lung on prior exam appears
stable.
IMPRESSION: No acute cardiopulmonary abnormality seen. Stable nodular density
seen in right mid lung compared to prior exam; follow-up chest
radiograph in 6 months is recommended.

## 2013-09-17 ENCOUNTER — Encounter (HOSPITAL_COMMUNITY): Payer: Self-pay | Admitting: Emergency Medicine

## 2013-09-17 ENCOUNTER — Emergency Department (HOSPITAL_COMMUNITY)
Admission: EM | Admit: 2013-09-17 | Discharge: 2013-09-17 | Disposition: A | Discharge status: Home / Self Care | Payer: Medicaid Other | Attending: Emergency Medicine | Admitting: Emergency Medicine

## 2013-09-17 ENCOUNTER — Emergency Department (HOSPITAL_COMMUNITY): Payer: Medicaid Other

## 2013-09-17 DIAGNOSIS — J441 Chronic obstructive pulmonary disease with (acute) exacerbation: Secondary | ICD-10-CM | POA: Insufficient documentation

## 2013-09-17 DIAGNOSIS — I10 Essential (primary) hypertension: Secondary | ICD-10-CM | POA: Insufficient documentation

## 2013-09-17 DIAGNOSIS — J159 Unspecified bacterial pneumonia: Secondary | ICD-10-CM | POA: Insufficient documentation

## 2013-09-17 DIAGNOSIS — IMO0002 Reserved for concepts with insufficient information to code with codable children: Secondary | ICD-10-CM | POA: Insufficient documentation

## 2013-09-17 DIAGNOSIS — Z79899 Other long term (current) drug therapy: Secondary | ICD-10-CM | POA: Insufficient documentation

## 2013-09-17 DIAGNOSIS — Z87891 Personal history of nicotine dependence: Secondary | ICD-10-CM | POA: Insufficient documentation

## 2013-09-17 DIAGNOSIS — J189 Pneumonia, unspecified organism: Secondary | ICD-10-CM

## 2013-09-17 MED ORDER — ALBUTEROL SULFATE (2.5 MG/3ML) 0.083% IN NEBU
2.5000 mg | INHALATION_SOLUTION | Freq: Once | RESPIRATORY_TRACT | Status: AC
  Administered 2013-09-17: 2.5 mg via RESPIRATORY_TRACT
  Filled 2013-09-17: qty 3

## 2013-09-17 MED ORDER — LEVOFLOXACIN 500 MG PO TABS
750.0000 mg | ORAL_TABLET | Freq: Once | ORAL | Status: AC
  Administered 2013-09-17: 750 mg via ORAL
  Filled 2013-09-17: qty 2

## 2013-09-17 MED ORDER — LEVOFLOXACIN 750 MG PO TABS: ORAL_TABLET | ORAL | Status: AC

## 2013-09-17 NOTE — ED Notes (Signed)
Patient discharge home with wife. Pt verbalized understanding of d/c instructions

## 2013-09-17 NOTE — ED Provider Notes (Signed)
CSN: [id number]     Arrival date & time 09/17/13  1252 History   First MD Initiated Contact with Patient 09/17/13 1308     Chief Complaint  Patient presents with  . Shortness of Breath  . Cough  . COPD     (Consider location/radiation/quality/duration/timing/severity/associated sxs/prior Treatment) Patient is a 58 y.o. male presenting with cough. The history is provided by the patient.  Cough Cough characteristics:  Productive Sputum characteristics:  White Severity:  Moderate Onset quality:  Gradual Duration:  2 weeks Timing:  Constant Progression:  Worsening Chronicity:  New Smoker: former smoker.   Context comment:  At rest Relieved by: azithromycin and prednisone. Worsened by:  Nothing tried Ineffective treatments:  None tried Associated symptoms: no rhinorrhea     Past Medical History  Diagnosis Date  . COPD (chronic obstructive pulmonary disease)   . HTN (hypertension)    Past Surgical History  Procedure Laterality Date  . Hemorrhoid surgery     Family History  Problem Relation Age of Onset  . Cancer      Aunt   History  Substance Use Topics  . Smoking status: Former Smoker -- 0.75 packs/day for 40 years    Types: Cigarettes  . Smokeless tobacco: Former Systems developer    Quit date: 07/21/2012  . Alcohol Use: Yes     Comment: occ    Review of Systems  HENT: Negative for drooling and rhinorrhea.   Eyes: Negative for pain.  Respiratory: Positive for cough.   Cardiovascular: Negative for leg swelling.  Gastrointestinal: Negative for nausea and diarrhea.  Genitourinary: Negative for dysuria and hematuria.  Musculoskeletal: Negative for gait problem.  Skin: Negative for color change.  Neurological: Negative for numbness.  Hematological: Negative for adenopathy.  Psychiatric/Behavioral: Negative for behavioral problems.  All other systems reviewed and are negative.     Allergies  Review of patient's allergies indicates no known allergies.  Home  Medications   Prior to Admission medications   Medication Sig Start Date End Date Taking? Authorizing Provider  albuterol (PROVENTIL HFA;VENTOLIN HFA) 108 (90 BASE) MCG/ACT inhaler Inhale 2 puffs into the lungs every 4 (four) hours as needed for wheezing or shortness of breath. 04/19/13   Sharon Mt [address], MD  mometasone-formoterol [facility]) 200-5 MCG/ACT AERO Inhale 2 puffs into the lungs 2 (two) times daily. 04/19/13   [place], MD  tiotropium (SPIRIVA) 18 MCG inhalation capsule Place 1 capsule (18 mcg total) into inhaler and inhale daily. 01/11/13   Sharon [address], MD   There were no vitals taken for this visit. Physical Exam  Nursing note and vitals reviewed. Constitutional: He is oriented to person, place, and time. He appears well-developed and well-nourished.  HENT:  Head: Normocephalic and atraumatic.  Right Ear: External ear normal.  Left Ear: External ear normal.  Nose: Nose normal.  Mouth/Throat: Oropharynx is clear and moist. No oropharyngeal exudate.  Eyes: Conjunctivae and EOM are normal. Pupils are equal, round, and reactive to light.  Neck: Normal range of motion. Neck supple.  Cardiovascular: Normal rate, regular rhythm, normal heart sounds and intact distal pulses.  Exam reveals no gallop and no friction rub.   No murmur heard. Pulmonary/Chest: Effort normal and breath sounds normal. No respiratory distress. He has no wheezes.  Abdominal: Soft. Bowel sounds are normal. He exhibits no distension. There is no tenderness. There is no rebound and no guarding.  Musculoskeletal: Normal range of motion. He exhibits no edema and no tenderness.  Neurological: He is alert  and oriented to person, place, and time.  Skin: Skin is warm and dry.  Psychiatric: He has a normal mood and affect. His behavior is normal.    ED Course  Procedures (including critical care time) Labs Review Labs Reviewed - No data to display  Imaging Review Dg Chest 2  View  09/17/2013   CLINICAL DATA:  Productive cough for 1 week, history of COPD, former smoker  EXAM: CHEST  2 VIEW  COMPARISON:  04/06/2013; 10/11/2012  FINDINGS: Grossly unchanged cardiac silhouette and mediastinal contours. The lungs appear hyperexpanded with flattening of the bilateral diaphragms. Possible developing airspace opacity within the right perihilar lung. No pleural effusion or pneumothorax. No evidence of edema. No acute osseus abnormalities.  IMPRESSION: Hyperexpanded lungs with possible airspace opacity within the right perihilar lung worrisome for a developing area of infection. A follow-up chest radiograph in 4 to 6 weeks after treatment is recommended to ensure resolution.   Electronically Signed   By: Sandi Mariscal M.D.   On: 09/17/2013 15:17     EKG Interpretation   Date/Time:  Friday Sep 17 2013 13:08:52 EDT Ventricular Rate:  75 PR Interval:  166 QRS Duration: 80 QT Interval:  365 QTC Calculation: 408 R Axis:   79 Text Interpretation:  Sinus rhythm Biatrial enlargement RSR' in V1 or V2,  probably normal variant t waves in inerior and  lateral leads no longer  inverted otherwise no significant change Confirmed by Oddis Westling  MD,  [place] ([id number]) on 09/17/2013 1:21:29 PM      MDM   Final diagnoses:  Community acquired pneumonia    1:23 PM 58 y.o. male with history of COPD and hypertension who presents with cough. He states that he began having a cough with increased sputum at the beginning of May. He was seen at a hospital in [place] and received steroids and azithromycin. He notes since returning to [place] several days ago he has had increased cough productive of white sputum. He denies any fevers. He has mild shortness of breath with ambulation but no new significant shortness of breath. He is afebrile and vital signs are unremarkable here. He denies any pain. Will get screening chest x-ray. No wheezing heard on exam but patient would like a breathing  treatment.  3:37 PM: CXR suspicious for airspace opacity. Pt has been on azithromycin w/in last 1-2 weeks and has finished a 5 day course. Will place on levaquin. Pt not hypoxic, not currently sob, appears well.  I have discussed the diagnosis/risks/treatment options with the patient and believe the pt to be eligible for discharge home to follow-up with and establish w/ a pcp. We also discussed returning to the ED immediately if new or worsening sx occur. We discussed the sx which are most concerning (e.g., sob, fever, worsening cough) that necessitate immediate return. Medications administered to the patient during their visit and any new prescriptions provided to the patient are listed below.  Medications given during this visit Medications  levofloxacin (LEVAQUIN) tablet 750 mg (not administered)  albuterol (PROVENTIL) (2.5 MG/3ML) 0.083% nebulizer solution 2.5 mg (2.5 mg Nebulization Given 09/17/13 1407)    New Prescriptions   LEVOFLOXACIN (LEVAQUIN) 750 MG TABLET    Take 1 tablet by mouth daily for the next 4 days.     Blanchard Kelch, MD 09/17/13 1539

## 2013-09-17 NOTE — ED Notes (Signed)
Pt states that he has been feeling SOB.  Was seen in [place] on 5/3 and 5/7 and was given rx prednisone, azithromycin, albuterol inhaler.  Pt states he could not get them filled because "he has medicaid and is from [place]".  States he has been feeling SOB w/ productive cough x 4 days. States that he has been coughing up white mucous.

## 2013-09-17 NOTE — ED Notes (Signed)
Pt completing sentences without issue.  No obvious trouble breathing.

## 2013-09-17 NOTE — Discharge Instructions (Signed)
°Emergency Department Resource Guide °1) Find a Doctor and Pay Out of Pocket °Although you won't have to find out who is covered by your insurance plan, it is a good idea to ask around and get recommendations. You will then need to call the office and see if the doctor you have chosen will accept you as a new patient and what types of options they offer for patients who are self-pay. Some doctors offer discounts or will set up payment plans for their patients who do not have insurance, but you will need to ask so you aren't surprised when you get to your appointment. ° °2) Contact Your Local Health Department °Not all health departments have doctors that can see patients for sick visits, but many do, so it is worth a call to see if yours does. If you don't know where your local health department is, you can check in your phone book. The CDC also has a tool to help you locate your state's health department, and many state websites also have listings of all of their local health departments. ° °3) Find a Walk-in Clinic °If your illness is not likely to be very severe or complicated, you may want to try a walk in clinic. These are popping up all over the country in pharmacies, drugstores, and shopping centers. They're usually staffed by nurse practitioners or physician assistants that have been trained to treat common illnesses and complaints. They're usually fairly quick and inexpensive. However, if you have serious medical issues or chronic medical problems, these are probably not your best option. ° °No Primary Care Doctor: °- Call [facility] at  832-8000 - they can help you locate a primary care doctor that  accepts your insurance, provides certain services, etc. °- Physician Referral Service- 1-800-533-3463 ° °Chronic Pain Problems: °Organization         Address  Phone   Notes  °[facility]  (336) 297-2271 Patients need to be referred by their primary care doctor.  ° °Medication  Assistance: °Organization         Address  Phone   Notes  °[place] [facility] [address] °[place], [place] [address] (336) 641-8030 --Must be a resident of [place] °-- Must have NO insurance coverage whatsoever (no Medicaid/ Medicare, etc.) °-- The pt. MUST have a primary care doctor that directs their care regularly and follows them in the community °  °[facility]  (866) 331-1348   °[employer]  (888) 892-1162   ° °Agencies that provide inexpensive medical care: °Organization         Address  Phone   Notes  °[facility]  (336) 832-8035   °[facility]    (336) 832-7272   °[facility] [address] °[place], [place] [address] (336) 832-4777   °[facility] [address], °[employer] (336) 271-4999   °[facility]    (336) 373-0678   °[facility]    (336) 272-1050   °[facility] ° [address], [place] Phone:  (336) 832-4444, Fax:  (336) 832-4440 Hours of Operation:  9 am - 6 pm, M-F.  Also accepts Medicaid/Medicare and self-pay.  °[facility] ° [address], [place] Phone: (336) 832-3150, Fax: (336) 832-3151. Hours of Operation:  8:30 am - 5:30 pm, M-F.  Also accepts Medicaid and self-pay.  °[facility] [place] [address], [place] Phone: (336) 878-6027   °[facility] [address], [place], [place] (336)723-1848, Ext. 123 Mondays & Thursdays: 7-9 AM.  First 15 patients are seen on a first come, first serve basis. °  ° °Medicaid-accepting [place] Providers: ° °Organization         Address  Phone   Notes  °[facility] [address], [place] (336) 641-2100 Also accepts self-pay patients.  °[facility] [address], [place] ° (336) 856-9996   °[facility] [address], [place]  (336) 288-8857   °[facility] [address] [place] [address], [place] (336) 299-7000   °Veita Bland [address], [place]  ° (336) 373-1557 Only accepts [place] Access Medicaid patients after they have their name applied to their card.  ° °Self-Pay (no insurance) in [place]: ° °Organization         Address  Phone   Notes  °Sickle Cell Patients, [facility] [address], [place] (336) 832-1970   °[facility] [address], [place] (336) 832-4400   °[facility] ° [address], [place] (336) 992-4800   °[facility]/Dr. Osei-Bonsu ° [address] [place] [address], [place] or [address], [place] (336) 841-8500 Phone number for both [place] and [place] locations is the same.  °[facility] [address], [place] (336) 299-0000   °[facility] [address] [place] [address], [place] or [address] (336) 852-7530 °(336) 878-2260   °[facility] [address], [place] (336) 350-1642, phone; (336) 294-5005, fax Sees patients 1st and 3rd Saturday of every month.  Must not qualify for public or private insurance (i.e. Medicaid, Medicare, [place] Health Choice, Veterans' Benefits) • Household income should be no more than 200% of the poverty level •The clinic cannot treat you if you are pregnant or think you are pregnant • Sexually transmitted diseases are not treated at the clinic.  ° ° °Dental Care: °Organization         Address  Phone  Notes  °[place] [facility] [address], [place] (336) 641-6152 Accepts children up to age 21 who are enrolled in Medicaid or [place] Health Choice; pregnant women with a Medicaid card; and children who have applied for Medicaid or [place] Health Choice, but were declined, whose parents can pay a reduced fee at time of service.  °[place]  [facility] [place]  [address], [place] (336) 641-7733 Accepts children up to age 21 who are enrolled in Medicaid or [place] Health Choice; pregnant women with a Medicaid card; and children who have applied for Medicaid or [place] Health Choice, but were declined, whose parents can pay a reduced fee at time of service.  °[facility] ° [address],  (336) 641-4533 Patients are seen by appointment only. Walk-ins are not accepted. [facility] will see patients 18 years of age and older. °Monday - Tuesday (8am-5pm) °Most Wednesdays (8:30-5pm) °$30 per visit, cash only  °[facility] ° [address], [place] (336) 641-4533 Patients are seen by appointment only. Walk-ins are not accepted. [facility] will see patients 18 years of age and older. °One   Wednesday Evening (Monthly: Volunteer Based).  $30 per visit, cash only  °[facility]  (919) 537-3737 for adults; Children under age 4, call Graduate Pediatric Dentistry at (919) [phone]. Children aged 4-14, please call (919) [phone] to request a pediatric application. ° Dental services are provided in all areas of dental care including fillings, crowns and bridges, complete and partial dentures, implants, gum treatment, root canals, and extractions. Preventive care is also provided. Treatment is provided to both adults and children. °Patients are selected via a lottery and there is often a waiting list. °  °[facility] [address], °[place] ° (336) 763-8833 [email or web address] °  °[facility] [address], [place], [place] (336)723-1848, Ext. 123 Second and Fourth Thursday of each month, opens at 6:30 AM; Clinic ends at 9 AM.  Patients are seen on a first-come first-served basis, and a limited number are seen during each clinic.  ° °[facility] ° [address], [place], [place] (336) 723-7904    Eligibility Requirements °You must have lived in [place], [place], or [place] counties for at least the last three months. °  You cannot be eligible for state or federal sponsored healthcare insurance, including Veterans Administration, Medicaid, or Medicare. °  You generally cannot be eligible for healthcare insurance through your employer.  °  How to apply: °Eligibility screenings are held every Tuesday and Wednesday afternoon from 1:00 pm until 4:00 pm. You do not need an appointment for the interview!  °[facility] [address], [place], [place] 336-631-2330   °[facility]  336-342-8273   °[facility]  336-703-3100   °[facility]  336-570-6415   ° °Behavioral Health Resources in the Community: °Intensive Outpatient Programs °Organization         Address  Phone  Notes  °[facility] [address], [place], [place] 336-878-6098   °[facility] [address], [place], [place] 336-832-9800   °[facility] [address], [place], [place] ° 336-882-2125   °[facility] [address],  °[place], [place] 1-800-853-5163 or 336-641-4981   °Substance Abuse Resources °Organization         Address  Phone  Notes  °[facility]  336-882-2125   °[facility]  336-784-9470   °[facility]  336-285-9073   °[facility]  336-845-3988   °Residential & Outpatient Substance Abuse Program  1-800-659-3381   °Psychological Services °Organization         Address  Phone  Notes  °[facility]  336- 832-9600   °[facility]  336- 378-7881   °[facility] [address], [place] 1-800-853-5163 or 336-641-4981   ° °Mobile Crisis Teams °Organization         Address  Phone  Notes  °[facility], Mobile Crisis Care Unit  1-877-626-1772   °Assertive °[facility] ° [address]  [place], [place] 336-834-9664   °Sharon DeEsch [address] °[place] 336-554-5454   ° °Self-Help/Support Groups °Organization         Address  Phone             Notes  °[facility] [place] [facility] - variety of support groups  336- 373-1402 Call for more information  °Narcotics Anonymous (NA), [facility] [address], °[place]  2 meetings at this location  ° °  Residential Treatment Programs °Organization         Address  Phone  Notes  °[facility] [address],    °[place]  1-866-801-8205   °[facility] ° [address], [place], [place] 704-293-8524   °[facility] [address], [place] 336-845-3988 Admissions: 8am-3pm M-F  °[facility] [address]    °[place], [place] 336-841-1104   °[facility] [address], [place], [place] 336-379-7146   °[facility] [address]  °[place], [place] 336-285-9073   °[facility] Intensive Outpatient [address], [place], [place] 336-852-3033   °[facility] [address]  °[place], [place] 1-877-615-2722 or 336-784-9470   °[facility] [address] [place], [place] 336-227-7417 Accepts Medicaid  °[facility] [address]  °[place] 1-800-659-3381 Substance Abuse/Addiction Treatment  ° °[place] Behavioral Health Resources °Organization         Address  Phone  Notes  °[facility]  (888) 581-9988   °Julie Brannon, PhD [address] [place], [place]   (336) 349-5553 or (336) 951-0000   °[facility]   [address] °[place], [place] (336) 349-4454   °[facility] [address], [place], [place] (336) 342-8316 Insurance/Medicaid/sponsorship through [facility]  °[facility] [address]                                    [place], [place] (336) 342-8316 Therapy/tele-psych/case    °[facility] [address]  ° [place], [place] (336) 349-2233    °Dr. Arfeen  (336) 349-4544   °[facility] [place]  [employer] [place] [facility] 1) [address], [place] °2) [address], [place] °3)  [address], [place] (336) 349-3220 °(336) 342-7768 ° °(336) 342-8140   °[place] [facility] (336) 342-1394 or (336) 342-3537 (After Hours)    ° ° °

## 2013-10-06 ENCOUNTER — Encounter: Payer: Self-pay | Admitting: Family Medicine

## 2013-10-06 ENCOUNTER — Ambulatory Visit (INDEPENDENT_AMBULATORY_CARE_PROVIDER_SITE_OTHER): Payer: Medicaid Other | Admitting: Family Medicine

## 2013-10-06 VITALS — BP 132/88 | HR 64 | Temp 97.7°F | Wt 133.0 lb

## 2013-10-06 DIAGNOSIS — J449 Chronic obstructive pulmonary disease, unspecified: Secondary | ICD-10-CM

## 2013-10-06 DIAGNOSIS — IMO0001 Reserved for inherently not codable concepts without codable children: Secondary | ICD-10-CM

## 2013-10-06 DIAGNOSIS — R030 Elevated blood-pressure reading, without diagnosis of hypertension: Secondary | ICD-10-CM

## 2013-10-06 MED ORDER — MOMETASONE FURO-FORMOTEROL FUM 200-5 MCG/ACT IN AERO: 2.0000 | INHALATION_SPRAY | Freq: Two times a day (BID) | RESPIRATORY_TRACT | Status: DC

## 2013-10-06 MED ORDER — TIOTROPIUM BROMIDE MONOHYDRATE 18 MCG IN CAPS
18.0000 ug | ORAL_CAPSULE | Freq: Every day | RESPIRATORY_TRACT | Status: DC
Start: 2013-10-06 — End: 2014-03-03

## 2013-10-06 NOTE — Progress Notes (Signed)
   Subjective:    Patient ID: Gavin Kane, male    DOB: 1955-08-05, 58 y.o.   MRN: [id number]  HPI: Pt present to clinic for general follow up of COPD, with recent diagnosis / treatment for pneumonia. He states he completed a course of steroids and azithromycin then Levaquin (see recent ED notes; was initially seen in [place], then here in [place]). He reports compliance with Spiriva, Dulera, and albuterol, and needs refills on Spiriva and Dulera. He overall feels better, but still does have some phlegm production, mostly in the mornings. He has had no more fevers, N/V, and his cough through the day is not bad. He is no longer smoking other than a "puff now and then." He has not taken anything over the counter for the cough or phlegm production.  Pt also questions his BP and has several questions about whether he should be on medication, if his blood pressure will get worse, etc. Denies chest pain, new / different SOB, change in vision, or headache. See A&P.  Review of Systems: As above.     Objective:   Physical Exam BP 132/88  Pulse 64  Temp(Src) 97.7 F (36.5 C) (Oral)  Wt 133 lb (60.328 kg) Gen: well-appearing adult male in NAD Cardio: RRR, no murmur appreciated Pulm: CTAB, no wheezes, normal work of breathing; very faint crackles in left base that clear with cough Abd: soft, nontender, BS+ Ext: warm, well-perfused, no LE edema     Assessment & Plan:

## 2013-10-06 NOTE — Assessment & Plan Note (Signed)
Intermittent elevations (today 132/88, but in the past 120's / 70's) without frank symptoms. Discussed at length that pt in general does not have severe HTN and we could start him on medication or monitor with therapeutic lifestyle modifications for now. Explained to pt that pulmonary and cardiac disease can worsen HTN and vice-versa, and that over time HTN does increase risk for CVD such as MI or stroke. Continued to recommend decreased salt intake, control weight and portion sizes at meals, increase activity / exercise regularly, etc. Directed pt to [email or web address] to help with diet changes. Will monitor regularly at f/u, though expect starting HCTZ or amlodipine may be helpful if frank/direct management of HTN is not ultimately required.

## 2013-10-06 NOTE — Assessment & Plan Note (Signed)
A: Reports doing well after recent pneumonia, and does need refills of Spiriva and Dulera, but not albuterol. Exam benign. Symptoms seem well-controlled, definitely much improved from initial diagnosis last year.  P: Refilled Dulera and Spiriva; last Rx for [facility] required paperwork for prior auth -- [place] Medicaid preferred but requires clinical criteria for which pt is exempt due to diagnosis of COPD). Discussed red flags for when to present to clinic or call 911. F/u in about 3 months or sooner if needed.

## 2013-10-06 NOTE — Patient Instructions (Signed)
Thank you for coming in, today!  I'm glad your pneumonia is doing better. Keep taking your regular medicines. You can take something like Mucinex over the counter for the phlegm. I will send in your refills today.  Call me if you have any problems with the medicine. In general, if you have refill problems, call your pharmacy, and they'll contact me. Come back to see me in about 3 months, or sooner if you need. Please feel free to call with any questions or concerns at any time, at [phone]. --Dr. Venetia Maxon

## 2014-03-03 ENCOUNTER — Ambulatory Visit (INDEPENDENT_AMBULATORY_CARE_PROVIDER_SITE_OTHER): Payer: Medicaid Other | Admitting: Family Medicine

## 2014-03-03 ENCOUNTER — Encounter: Payer: Self-pay | Admitting: Family Medicine

## 2014-03-03 VITALS — BP 132/87 | HR 78 | Temp 98.1°F | Ht 65.0 in | Wt 139.0 lb

## 2014-03-03 DIAGNOSIS — J449 Chronic obstructive pulmonary disease, unspecified: Secondary | ICD-10-CM

## 2014-03-03 DIAGNOSIS — Z23 Encounter for immunization: Secondary | ICD-10-CM

## 2014-03-03 DIAGNOSIS — Z0000 Encounter for general adult medical examination without abnormal findings: Secondary | ICD-10-CM

## 2014-03-03 MED ORDER — TRAZODONE HCL 50 MG PO TABS: 50.0000 mg | ORAL_TABLET | Freq: Every evening | ORAL | Status: DC | PRN

## 2014-03-03 MED ORDER — MOMETASONE FURO-FORMOTEROL FUM 200-5 MCG/ACT IN AERO: 2.0000 | INHALATION_SPRAY | Freq: Two times a day (BID) | RESPIRATORY_TRACT | Status: DC

## 2014-03-03 MED ORDER — ALBUTEROL SULFATE HFA 108 (90 BASE) MCG/ACT IN AERS: 2.0000 | INHALATION_SPRAY | Freq: Four times a day (QID) | RESPIRATORY_TRACT | Status: DC | PRN

## 2014-03-03 MED ORDER — TIOTROPIUM BROMIDE MONOHYDRATE 18 MCG IN CAPS: 18.0000 ug | ORAL_CAPSULE | Freq: Every day | RESPIRATORY_TRACT | Status: DC

## 2014-03-03 NOTE — Patient Instructions (Signed)
Thank you for coming in, today!  Everything looks fine on your exam.  We will give you a flu shot, a tetanus shot, and a pneumonia shot, today. Look into getting a colonoscopy. Follow the instructinos on your handout.  For your sleep, I will prescribe a medication called trazodone. Take one to two pills (50 to 100 mg) at bedtime to help you sleep. Start with one pill and try that for a few nights, and go up to 2 as you need. Otherwise I refilled your COPD medications without any changes.  Come back to see me in a month or so and we'll see how your sleep is going. If you need to see me before then, call to come back any time. Please feel free to call with any questions or concerns at any time, at [phone]. --Dr. Venetia Maxon

## 2014-03-03 NOTE — Progress Notes (Signed)
Subjective:    Patient ID: Gavin Kane, male    DOB: [birth year], 58 y.o.   MRN: [id number]  HPI: Pt presents to clinic for his annual physical exam. He generally feels. He generally feels well other than some aches and pains. He has some hamstring tightness / pain intermittently for about a month, especially when he drives. He has not taken any medication for these pains. He has COPD and borderline HTN and reports compliance with medications. He does have some occasional SOB, but medications as prescribed helped. Pt does report some poor sleep and "feeling down, a lot," but is slightly vague and states he "feels okay," other than being tired all the time and not sleeping well. Pt is a former smoker.   Family History  Problem Relation Age of Onset  . Cancer      Aunt    Past Medical History  Diagnosis Date  . COPD (chronic obstructive pulmonary disease)   . HTN (hypertension)     Past Surgical History  Procedure Laterality Date  . Hemorrhoid surgery      History   Social History  . Marital Status: Single    Spouse Name: N/A    Number of Children: N/A  . Years of Education: N/A   Occupational History  . Not on file.   Social History Main Topics  . Smoking status: Former Smoker -- 0.75 packs/day for 40 years    Types: Cigarettes  . Smokeless tobacco: Former Systems developer    Quit date: 07/21/2012  . Alcohol Use: Yes     Comment: occ  . Drug Use: No  . Sexual Activity: Not on file   Other Topics Concern  . 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 Never married.  2 children.    In addition to the above documentation, pt's PMH, surgical history, FH, and SH all reviewed and updated where appropriate in the EMR. I have also reviewed and updated the pt's allergies and current medications as appropriate.  Review of Systems: As above. Denies chest pain, fever / chills, cough. He does have some SOB with  activity, which is at baseline.     Objective:   Physical Exam BP 132/87  Pulse 78  Temp(Src) 98.1 F (36.7 C) (Oral)  Ht 5\' 5"  (1.651 m)  Wt 139 lb (63.05 kg)  BMI 23.13 kg/m2 Gen: well-appearing adult male in NAD HEENT: [address]/AT, sclerae/conjunctivae clear, no lid lag, EOMI, PERRLA   MMM, posterior oropharynx clear, no cervical lymphadenopathy  neck supple with full ROM, no masses appreciated; thyroid not enlarged  Cardio: RRR, no murmur appreciated; distal pulses intact/symmetric Pulm: CTAB, mild wheezes, normal WOB  Abd: soft, nondistended, BS+, no HSM Ext: warm/well-perfused, no cyanosis/clubbing/edema MSK: strength 5/5 in all four extremities, no frank joint deformity/effusion  normal ROM to all four extremities with no point muscle/bony tenderness in spine Neuro/Psych: alert/oriented, sensation grossly intact; normal gait/balance  mood relatively euthymic with congruent and reactive affect     Assessment & Plan:  58yo male with COPD, needs refills on medications - poor sleep, possible depression - start trazodone, f/u in 1 month for full eval  Anticipatory guidance / Risk factor reduction - discussed maintenance of healthy weight with regular diet / exercise - advised regular follow-up and compliance with medication - praised abstinence from smoking  Immunization / screening / ancillary studies  - given Tdap, pneumonia, and influenza  shots, today - provided information on getting screening colonoscopy  F/u in 1 year for next well exam, or sooner as needed (plus closer f/u for sleep / mood, as above).  Emmaline Kluver, MD PGY-3, [facility] 03/04/2014, 9:08 PM

## 2014-04-30 ENCOUNTER — Emergency Department (HOSPITAL_COMMUNITY): Payer: Medicaid Other

## 2014-04-30 ENCOUNTER — Emergency Department (HOSPITAL_COMMUNITY)
Admission: EM | Admit: 2014-04-30 | Discharge: 2014-04-30 | Disposition: A | Discharge status: Home / Self Care | Payer: Medicaid Other | Attending: Emergency Medicine | Admitting: Emergency Medicine

## 2014-04-30 ENCOUNTER — Encounter (HOSPITAL_COMMUNITY): Payer: Self-pay | Admitting: Emergency Medicine

## 2014-04-30 DIAGNOSIS — J441 Chronic obstructive pulmonary disease with (acute) exacerbation: Secondary | ICD-10-CM | POA: Diagnosis not present

## 2014-04-30 DIAGNOSIS — I10 Essential (primary) hypertension: Secondary | ICD-10-CM | POA: Diagnosis not present

## 2014-04-30 DIAGNOSIS — Z79899 Other long term (current) drug therapy: Secondary | ICD-10-CM | POA: Insufficient documentation

## 2014-04-30 DIAGNOSIS — M25561 Pain in right knee: Secondary | ICD-10-CM | POA: Diagnosis not present

## 2014-04-30 DIAGNOSIS — Z7951 Long term (current) use of inhaled steroids: Secondary | ICD-10-CM | POA: Insufficient documentation

## 2014-04-30 DIAGNOSIS — M79606 Pain in leg, unspecified: Secondary | ICD-10-CM

## 2014-04-30 DIAGNOSIS — Z87891 Personal history of nicotine dependence: Secondary | ICD-10-CM | POA: Diagnosis not present

## 2014-04-30 DIAGNOSIS — R0602 Shortness of breath: Secondary | ICD-10-CM

## 2014-04-30 DIAGNOSIS — J449 Chronic obstructive pulmonary disease, unspecified: Secondary | ICD-10-CM

## 2014-04-30 LAB — CBC WITH DIFFERENTIAL/PLATELET
BASOS ABS: 0 10*3/uL (ref 0.0–0.1)
BASOS PCT: 0 % (ref 0–1)
Eosinophils Absolute: 0.2 10*3/uL (ref 0.0–0.7)
Eosinophils Relative: 1 % (ref 0–5)
HCT: 43.2 % (ref 39.0–52.0)
Hemoglobin: 14.1 g/dL (ref 13.0–17.0)
Lymphocytes Relative: 24 % (ref 12–46)
Lymphs Abs: 2.8 10*3/uL (ref 0.7–4.0)
MCH: 28.9 pg (ref 26.0–34.0)
MCHC: 32.6 g/dL (ref 30.0–36.0)
MCV: 88.5 fL (ref 78.0–100.0)
Monocytes Absolute: 0.6 10*3/uL (ref 0.1–1.0)
Monocytes Relative: 6 % (ref 3–12)
NEUTROS ABS: 7.7 10*3/uL (ref 1.7–7.7)
NEUTROS PCT: 69 % (ref 43–77)
PLATELETS: 268 10*3/uL (ref 150–400)
RBC: 4.88 MIL/uL (ref 4.22–5.81)
RDW: 13.8 % (ref 11.5–15.5)
WBC: 11.3 10*3/uL — ABNORMAL HIGH (ref 4.0–10.5)

## 2014-04-30 LAB — PROTIME-INR
INR: 1.07 (ref 0.00–1.49)
Prothrombin Time: 14 seconds (ref 11.6–15.2)

## 2014-04-30 LAB — COMPREHENSIVE METABOLIC PANEL
ALBUMIN: 3.9 g/dL (ref 3.5–5.2)
ALT: 7 U/L (ref 0–53)
AST: 24 U/L (ref 0–37)
Alkaline Phosphatase: 74 U/L (ref 39–117)
Anion gap: 5 (ref 5–15)
BILIRUBIN TOTAL: 1.2 mg/dL (ref 0.3–1.2)
BUN: 18 mg/dL (ref 6–23)
CHLORIDE: 108 meq/L (ref 96–112)
CO2: 22 mmol/L (ref 19–32)
Calcium: 8.8 mg/dL (ref 8.4–10.5)
Creatinine, Ser: 0.92 mg/dL (ref 0.50–1.35)
GFR calc Af Amer: >90 mL/min (ref >90)
GFR calc non Af Amer: >90 mL/min (ref >90)
Glucose, Bld: 110 mg/dL — ABNORMAL HIGH (ref 70–99)
POTASSIUM: 4.4 mmol/L (ref 3.5–5.1)
SODIUM: 135 mmol/L (ref 135–145)
Total Protein: 7 g/dL (ref 6.0–8.3)

## 2014-04-30 LAB — APTT: APTT: 36 s (ref 24–37)

## 2014-04-30 LAB — BRAIN NATRIURETIC PEPTIDE: B NATRIURETIC PEPTIDE 5: 10.8 pg/mL (ref 0.0–100.0)

## 2014-04-30 LAB — TROPONIN I

## 2014-04-30 MED ORDER — PREDNISONE 50 MG PO TABS: ORAL_TABLET | ORAL | Status: DC

## 2014-04-30 MED ORDER — SODIUM CHLORIDE 0.9 % IV BOLUS (SEPSIS)
500.0000 mL | Freq: Once | INTRAVENOUS | Status: AC
  Administered 2014-04-30: 500 mL via INTRAVENOUS

## 2014-04-30 MED ORDER — ALBUTEROL SULFATE (2.5 MG/3ML) 0.083% IN NEBU
5.0000 mg | INHALATION_SOLUTION | Freq: Once | RESPIRATORY_TRACT | Status: AC
  Administered 2014-04-30: 5 mg via RESPIRATORY_TRACT
  Filled 2014-04-30: qty 6

## 2014-04-30 MED ORDER — IPRATROPIUM-ALBUTEROL 0.5-2.5 (3) MG/3ML IN SOLN
3.0000 mL | Freq: Once | RESPIRATORY_TRACT | Status: AC
  Administered 2014-04-30: 3 mL via RESPIRATORY_TRACT
  Filled 2014-04-30: qty 3

## 2014-04-30 MED ORDER — ALBUTEROL SULFATE HFA 108 (90 BASE) MCG/ACT IN AERS
2.0000 | INHALATION_SPRAY | Freq: Once | RESPIRATORY_TRACT | Status: AC
  Administered 2014-04-30: 2 via RESPIRATORY_TRACT
  Filled 2014-04-30: qty 6.7

## 2014-04-30 NOTE — ED Provider Notes (Signed)
CSN: [id number]     Arrival date & time 04/30/14  5176 History   First MD Initiated Contact with Patient 04/30/14 [phone]     Chief Complaint  Patient presents with  . Shortness of Breath     (Consider location/radiation/quality/duration/timing/severity/associated sxs/prior Treatment) The history is provided by the patient. No language interpreter was used.  Gavin Kane is a 58 y/o M with PMHx of HTN, COPD presenting to the shortness of breath with exertion. Patient reported that the shortness of breath has been ongoing for approximately one month - stated that the symptoms have gotten progressively worse over the past week. Stated that the shortness of breath is more so associated with exertion. Stated that he does have history of COPD and stated that he has been being followed by his PCP. Patient reported that he does continue to smoke approximately 5-6 cigarettes per day. Stated that he has been using his Albuterol, Spiriva as prescribed. Stated that he currently is not using oxygen at home. Stated that he has been having tightness in his left knee for the past month, ever since his road trip to [place] - stated that he did drive with breaks inbetween. Denied fever, chills, neck pain, neck stiffness, fall, injuries, surgery, calf pain, hemoptysis, loss of sensation, numbness, tingling, cough, leg swelling. PCP Dr. Venetia Maxon   Past Medical History  Diagnosis Date  . COPD (chronic obstructive pulmonary disease)   . HTN (hypertension)    Past Surgical History  Procedure Laterality Date  . Hemorrhoid surgery     Family History  Problem Relation Age of Onset  . Cancer      Aunt   History  Substance Use Topics  . Smoking status: Former Smoker -- 0.75 packs/day for 40 years    Types: Cigarettes  . Smokeless tobacco: Former Systems developer    Quit date: 07/21/2012  . Alcohol Use: Yes     Comment: occ    Review of Systems  Constitutional: Negative for fever and chills.  Respiratory: Positive for  shortness of breath. Negative for chest tightness.   Cardiovascular: Negative for chest pain.  Gastrointestinal: Negative for abdominal pain.  Musculoskeletal: Positive for arthralgias (right knee pain ). Negative for back pain and neck pain.  Neurological: Negative for dizziness, weakness, numbness and headaches.      Allergies  Review of patient's allergies indicates no known allergies.  Home Medications   Prior to Admission medications   Medication Sig Start Date End Date Taking? Authorizing Provider  acetaminophen (TYLENOL) 500 MG tablet Take 500 mg by mouth every 6 (six) hours as needed for headache.   Yes Historical Provider, MD  albuterol (PROVENTIL HFA;VENTOLIN HFA) 108 (90 BASE) MCG/ACT inhaler Inhale 2 puffs into the lungs every 6 (six) hours as needed for wheezing or shortness of breath. 03/03/14  Yes Sharon Mt Street, MD  mometasone-formoterol [facility]) 200-5 MCG/ACT AERO Inhale 2 puffs into the lungs 2 (two) times daily. 03/03/14  Yes Sharon Mt Street, MD  tiotropium (SPIRIVA) 18 MCG inhalation capsule Place 1 capsule (18 mcg total) into inhaler and inhale daily. 03/03/14  Yes Sharon Mt Street, MD  predniSONE (DELTASONE) 50 MG tablet Take one tablet, PO, daily for 5 days. 04/30/14   Salvadore Valvano, PA-C  traZODone (DESYREL) 50 MG tablet Take 1-2 tablets (50-100 mg total) by mouth at bedtime as needed for sleep. Patient not taking: Reported on 04/30/2014 03/03/14   Sharon Mt Street, MD   BP 150/79 mmHg  Pulse 97  Temp(Src) 98.9 F (  37.2 C) (Oral)  Resp 20  Ht 5\' 5"  (1.651 m)  Wt 135 lb (61.236 kg)  BMI 22.47 kg/m2  SpO2 93% Physical Exam  Constitutional: He is oriented to person, place, and time. He appears well-developed and well-nourished. No distress.  HENT:  Head: Normocephalic and atraumatic.  Mouth/Throat: Oropharynx is clear and moist. No oropharyngeal exudate.  Eyes: Conjunctivae and EOM are normal. Pupils are equal, round, and reactive to  light. Right eye exhibits no discharge. Left eye exhibits no discharge.  Neck: Normal range of motion. Neck supple. No tracheal deviation present.  Cardiovascular: Normal rate, regular rhythm and normal heart sounds.  Exam reveals no friction rub.   No murmur heard. Negative swelling or pitting edema noted to the lower extremities bilaterally   Pulmonary/Chest: Effort normal. No respiratory distress. He has no wheezes. He has no rales. He exhibits no tenderness.  Decreased breath sounds to upper and lower lobes bilaterally - negative wheezes noted Patient is able to speak in full sentences without difficulty  Negative use of accessory muscles  Negative stridor  Musculoskeletal: Normal range of motion.  Full ROM to upper and lower extremities without difficulty noted, negative ataxia noted.  Lymphadenopathy:    He has no cervical adenopathy.  Neurological: He is alert and oriented to person, place, and time. No cranial nerve deficit. He exhibits normal muscle tone. Coordination normal.  Skin: Skin is warm and dry. No rash noted. He is not diaphoretic. No erythema.  Psychiatric: He has a normal mood and affect. His behavior is normal. Thought content normal.  Nursing note and vitals reviewed.   ED Course  Procedures (including critical care time)  Results for orders placed or performed during the hospital encounter of 04/30/14  Troponin I  Result Value Ref Range   Troponin I <0.03 <0.031 ng/mL  CBC with Differential  Result Value Ref Range   WBC 11.3 (H) 4.0 - 10.5 K/uL   RBC 4.88 4.22 - 5.81 MIL/uL   Hemoglobin 14.1 13.0 - 17.0 g/dL   HCT 43.2 39.0 - 52.0 %   MCV 88.5 78.0 - 100.0 fL   MCH 28.9 26.0 - 34.0 pg   MCHC 32.6 30.0 - 36.0 g/dL   RDW 13.8 11.5 - 15.5 %   Platelets 268 150 - 400 K/uL   Neutrophils Relative % 69 43 - 77 %   Neutro Abs 7.7 1.7 - 7.7 K/uL   Lymphocytes Relative 24 12 - 46 %   Lymphs Abs 2.8 0.7 - 4.0 K/uL   Monocytes Relative 6 3 - 12 %   Monocytes  Absolute 0.6 0.1 - 1.0 K/uL   Eosinophils Relative 1 0 - 5 %   Eosinophils Absolute 0.2 0.0 - 0.7 K/uL   Basophils Relative 0 0 - 1 %   Basophils Absolute 0.0 0.0 - 0.1 K/uL  Comprehensive metabolic panel  Result Value Ref Range   Sodium 135 135 - 145 mmol/L   Potassium 4.4 3.5 - 5.1 mmol/L   Chloride 108 96 - 112 mEq/L   CO2 22 19 - 32 mmol/L   Glucose, Bld 110 (H) 70 - 99 mg/dL   BUN 18 6 - 23 mg/dL   Creatinine, Ser 0.92 0.50 - 1.35 mg/dL   Calcium 8.8 8.4 - 10.5 mg/dL   Total Protein 7.0 6.0 - 8.3 g/dL   Albumin 3.9 3.5 - 5.2 g/dL   AST 24 0 - 37 U/L   ALT 7 0 - 53 U/L   Alkaline Phosphatase  74 39 - 117 U/L   Total Bilirubin 1.2 0.3 - 1.2 mg/dL   GFR calc non Af Amer >90 >90 mL/min   GFR calc Af Amer >90 >90 mL/min   Anion gap 5 5 - 15  Protime-INR  Result Value Ref Range   Prothrombin Time 14.0 11.6 - 15.2 seconds   INR 1.07 0.00 - 1.49  APTT  Result Value Ref Range   aPTT 36 24 - 37 seconds  Brain natriuretic peptide  Result Value Ref Range   B Natriuretic Peptide 10.8 0.0 - 100.0 pg/mL    Labs Review Labs Reviewed  CBC WITH DIFFERENTIAL - Abnormal; Notable for the following:    WBC 11.3 (*)    All other components within normal limits  COMPREHENSIVE METABOLIC PANEL - Abnormal; Notable for the following:    Glucose, Bld 110 (*)    All other components within normal limits  TROPONIN I  PROTIME-INR  APTT  BRAIN NATRIURETIC PEPTIDE    Imaging Review Dg Chest 2 View (if Patient Has Fever And/or Copd)  04/30/2014   CLINICAL DATA:  Short of breath for 1 month  EXAM: CHEST  2 VIEW  COMPARISON:  09/17/2013  FINDINGS: The heart size and mediastinal contours are within normal limits. Both lungs are clear. The visualized skeletal structures are unremarkable.  IMPRESSION: No active cardiopulmonary disease.   Electronically Signed   By: Maryclare Bean M.D.   On: 04/30/2014 10:36     EKG Interpretation   Date/Time:  Saturday April 30 2014 10:12:16 EST Ventricular  Rate:  60 PR Interval:  158 QRS Duration: 96 QT Interval:  377 QTC Calculation: 377 R Axis:   47 Text Interpretation:  Sinus rhythm RSR' in V1 or V2, right VCD or RVH  Nonspecific T abnormalities, lateral leads no significant change as  compared to ecg on Feb, 2014 as compared to most recent ecg, May 2015, new  T wave inversions in V5/6 Confirmed by CAMPOS  MD, Lennette Bihari ([id number]) on  04/30/2014 10:20:04 AM      VASCULAR LAB PRELIMINARY PRELIMINARY PRELIMINARY PRELIMINARY  Right lower extremity venous Doppler completed.   Preliminary report: There is no DVT or SVT noted in the right lower extremity.   KANADY, CANDACE, RVT 04/30/2014, 1:20 PM   MDM   Final diagnoses:  Chronic obstructive pulmonary disease, unspecified COPD, unspecified chronic bronchitis type    Medications  albuterol (PROVENTIL HFA;VENTOLIN HFA) 108 (90 BASE) MCG/ACT inhaler 2 puff (not administered)  ipratropium-albuterol (DUONEB) 0.5-2.5 (3) MG/3ML nebulizer solution 3 mL (3 mLs Nebulization Given 04/30/14 0942)  albuterol (PROVENTIL) (2.5 MG/3ML) 0.083% nebulizer solution 5 mg (5 mg Nebulization Given 04/30/14 1109)  sodium chloride 0.9 % bolus 500 mL (0 mLs Intravenous Stopped 04/30/14 1259)   Filed Vitals:   04/30/14 1106 04/30/14 1109 04/30/14 1130 04/30/14 1200  BP: 139/92   150/79  Pulse: 65  71 97  Temp: 98.9 F (37.2 C)     TempSrc: Oral     Resp: 16  20 20   Height:      Weight:      SpO2: 98% 96% 98% 93%   EKG noted sinus rhythm with RSR' in V1 or V2, nonspecific T abnormalities-no significant change since last tracing. Troponin negative elevation. BNP negative elevation. CBC noted mildly elevated white blood cell count 11.3. Hemoglobin 14.1, hematocrit 43.2. CMP unremarkable-glucose 110, negative elevated anion gap 5.0 mEq/L. PT, INR, aPTT within normal limits. Doppler of right lower extremity negative for DVT or SVT.  Nebulizer treatments given in the ED setting. Patient responded well  and feels better after nebulizers given. Patient ambulated in the ED with negative signs of hypoxia - 95% on room air and heart rate 95 bpm. Doubt PE. Doubt pneumonia. Suspicion to be COPD exacerbation. Patient stable, afebrile. Patient not septic appearing. Discharged patient. Discharged patient with prednisone burst. Discussed with patient to rest and stay hydrated. Discussed with patient to continue to use inhaler as needed. Referred to PCP and recommended patient to be followed by a pulomnologist. Discussed with patient the importance of smoking cessation. Discussed with patient to closely monitor symptoms and if symptoms are to worsen or change to report back to the ED - strict return instructions given.  Patient agreed to plan of care, understood, all questions answered.   Jamse Mead, PA-C 04/30/14 1421  Artis Delay, MD 04/30/14 [phone]

## 2014-04-30 NOTE — ED Notes (Signed)
Respiratory called for breathing treatment.

## 2014-04-30 NOTE — ED Notes (Signed)
Sandwich and sprite provided to the patient

## 2014-04-30 NOTE — Progress Notes (Signed)
VASCULAR LAB PRELIMINARY  PRELIMINARY  PRELIMINARY  PRELIMINARY  Right lower extremity venous Doppler completed.    Preliminary report:  There is no DVT or SVT noted in the right lower extremity.   Allean Montfort, RVT 04/30/2014, 1:20 PM

## 2014-04-30 NOTE — Discharge Instructions (Signed)
Please call your doctor for a followup appointment within 24-48 hours. When you talk to your doctor please let them know that you were seen in the emergency department and have them acquire all of your records so that they can discuss the findings with you and formulate a treatment plan to fully care for your new and ongoing problems. Please call and set-up an appointment with your primary care provider to be seen and re-assessed  Please rest and stay hydrated Please avoid any physical or strenuous activity  Please take medications as prescribed and on a full stomach  Please decrease cigarette use for this can lead to worsening of COPD Please continue to monitor symptoms closely and if symptoms are to worsen or change (fever greater than 101, chills, sweating, nausea, vomiting, chest pain, shortness of breathe, difficulty breathing, weakness, numbness, tingling, worsening or changes to pain pattern, dizziness, headache, weakness, coughing up blood) please report back to the Emergency Department immediately.    Chronic Obstructive Pulmonary Disease Chronic obstructive pulmonary disease (COPD) is a common lung condition in which airflow from the lungs is limited. COPD is a general term that can be used to describe many different lung problems that limit airflow, including both chronic bronchitis and emphysema. If you have COPD, your lung function will probably never return to normal, but there are measures you can take to improve lung function and make yourself feel better.  CAUSES   Smoking (common).   Exposure to secondhand smoke.   Genetic problems.  Chronic inflammatory lung diseases or recurrent infections. SYMPTOMS   Shortness of breath, especially with physical activity.   Deep, persistent (chronic) cough with a large amount of thick mucus.   Wheezing.   Rapid breaths (tachypnea).   Gray or bluish discoloration (cyanosis) of the skin, especially in fingers, toes, or lips.    Fatigue.   Weight loss.   Frequent infections or episodes when breathing symptoms become much worse (exacerbations).   Chest tightness. DIAGNOSIS  Your health care provider will take a medical history and perform a physical examination to make the initial diagnosis. Additional tests for COPD may include:   Lung (pulmonary) function tests.  Chest X-ray.  CT scan.  Blood tests. TREATMENT  Treatment available to help you feel better when you have COPD includes:   Inhaler and nebulizer medicines. These help manage the symptoms of COPD and make your breathing more comfortable.  Supplemental oxygen. Supplemental oxygen is only helpful if you have a low oxygen level in your blood.   Exercise and physical activity. These are beneficial for nearly all people with COPD. Some people may also benefit from a pulmonary rehabilitation program. HOME CARE INSTRUCTIONS   Take all medicines (inhaled or pills) as directed by your health care provider.  Avoid over-the-counter medicines or cough syrups that dry up your airway (such as antihistamines) and slow down the elimination of secretions unless instructed otherwise by your health care provider.   If you are a smoker, the most important thing that you can do is stop smoking. Continuing to smoke will cause further lung damage and breathing trouble. Ask your health care provider for help with quitting smoking. He or she can direct you to community resources or hospitals that provide support.  Avoid exposure to irritants such as smoke, chemicals, and fumes that aggravate your breathing.  Use oxygen therapy and pulmonary rehabilitation if directed by your health care provider. If you require home oxygen therapy, ask your health care provider whether you  should purchase a pulse oximeter to measure your oxygen level at home.   Avoid contact with individuals who have a contagious illness.  Avoid extreme temperature and humidity  changes.  Eat healthy foods. Eating smaller, more frequent meals and resting before meals may help you maintain your strength.  Stay active, but balance activity with periods of rest. Exercise and physical activity will help you maintain your ability to do things you want to do.  Preventing infection and hospitalization is very important when you have COPD. Make sure to receive all the vaccines your health care provider recommends, especially the pneumococcal and influenza vaccines. Ask your health care provider whether you need a pneumonia vaccine.  Learn and use relaxation techniques to manage stress.  Learn and use controlled breathing techniques as directed by your health care provider. Controlled breathing techniques include:   Pursed lip breathing. Start by breathing in (inhaling) through your nose for 1 second. Then, purse your lips as if you were going to whistle and breathe out (exhale) through the pursed lips for 2 seconds.   Diaphragmatic breathing. Start by putting one hand on your abdomen just above your waist. Inhale slowly through your nose. The hand on your abdomen should move out. Then purse your lips and exhale slowly. You should be able to feel the hand on your abdomen moving in as you exhale.   Learn and use controlled coughing to clear mucus from your lungs. Controlled coughing is a series of short, progressive coughs. The steps of controlled coughing are:  1. Lean your head slightly forward.  2. Breathe in deeply using diaphragmatic breathing.  3. Try to hold your breath for 3 seconds.  4. Keep your mouth slightly open while coughing twice.  5. Spit any mucus out into a tissue.  6. Rest and repeat the steps once or twice as needed. SEEK MEDICAL CARE IF:   You are coughing up more mucus than usual.   There is a change in the color or thickness of your mucus.   Your breathing is more labored than usual.   Your breathing is faster than usual.  SEEK  IMMEDIATE MEDICAL CARE IF:   You have shortness of breath while you are resting.   You have shortness of breath that prevents you from:  Being able to talk.   Performing your usual physical activities.   You have chest pain lasting longer than 5 minutes.   Your skin color is more cyanotic than usual.  You measure low oxygen saturations for longer than 5 minutes with a pulse oximeter. MAKE SURE YOU:   Understand these instructions.  Will watch your condition.  Will get help right away if you are not doing well or get worse. Document Released: 01/30/2005 Document Revised: 09/06/2013 Document Reviewed: 12/17/2012 [facility] Patient Information 2015 [place], [place]. This information is not intended to replace advice given to you by your health care provider. Make sure you discuss any questions you have with your health care provider.

## 2014-04-30 NOTE — ED Notes (Addendum)
Pt from home c/o shortness of breath that has been ongoing x several weeks. Denies cough. Lung sounds diminished. Pt ambulated to room 12 from lobby and refused a wheelchair when offered. Pt short of breath with exertion.

## 2014-04-30 NOTE — ED Notes (Signed)
Pt walked with pulse ox stats stated in mid 90"s on all accounts

## 2014-04-30 NOTE — ED Notes (Signed)
Pt in XRAY 

## 2014-05-18 ENCOUNTER — Encounter: Payer: Self-pay | Admitting: Family Medicine

## 2014-05-18 ENCOUNTER — Ambulatory Visit (INDEPENDENT_AMBULATORY_CARE_PROVIDER_SITE_OTHER): Payer: Medicaid Other | Admitting: Family Medicine

## 2014-05-18 VITALS — BP 127/85 | HR 65 | Temp 97.6°F | Ht 65.0 in | Wt 140.9 lb

## 2014-05-18 DIAGNOSIS — F419 Anxiety disorder, unspecified: Secondary | ICD-10-CM | POA: Insufficient documentation

## 2014-05-18 DIAGNOSIS — J449 Chronic obstructive pulmonary disease, unspecified: Secondary | ICD-10-CM

## 2014-05-18 DIAGNOSIS — Z716 Tobacco abuse counseling: Secondary | ICD-10-CM

## 2014-05-18 DIAGNOSIS — F411 Generalized anxiety disorder: Secondary | ICD-10-CM

## 2014-05-18 MED ORDER — TIOTROPIUM BROMIDE MONOHYDRATE 18 MCG IN CAPS: 18.0000 ug | ORAL_CAPSULE | Freq: Every day | RESPIRATORY_TRACT | Status: DC

## 2014-05-18 MED ORDER — ALPRAZOLAM 0.5 MG PO TABS: 0.2500 mg | ORAL_TABLET | Freq: Two times a day (BID) | ORAL | Status: DC | PRN

## 2014-05-18 NOTE — Assessment & Plan Note (Signed)
Unspecified anxiety-type symptoms, though by pt description "attacks" sound like possible panic disorder. Pt states Xanax has worked in the past. Discussed options at length, and pt agrees to try a trial of Xanax 0.5 mg BID PRN for anxiety / sleep, with initial Rx for #60 tabs with no refills. Plan to f/u PRN -- if Xanax is significantly helpful and #60 tabs last 2-3 months (i.e., if pt does not use it BID every day), will plan to refill without specific office visit for this, then re-eval at next f/u for COPD. If pt is requiring every-day BID use, requested that he follow up in about 1 month to specifically discuss this and consider up-titration of Xanax, other medical therapy, counseling, etc.

## 2014-05-18 NOTE — Progress Notes (Signed)
   Subjective:    Patient ID: Gavin Kane, male    DOB: [birth year], 59 y.o.   MRN: [id number]  HPI: Pt present to clinic for general follow up of COPD, with recent ED visit for mild exacerbation, treated with prednisone. He would also like to discuss poor sleep / anxiety.  COPD: treated with prednisone in the past couple of weeks, as above. He feels this cleared up his symptoms. He reports compliance with Spiriva, Dulera, and albuterol, and needs refills on Spiriva. He has had no fevers, N/V, and has only mild, intermittent cough, especially with cold weather. He continues to state that he is "no longer smoking other than a puff now and then." He states he "met a man with O2 on the [place]" and they talked about different stages of COPD; pt states he was not aware there were specific stages, and has thought about seeing a lung specialist "if it's recommended."  Anxiety / poor sleep: present for several months, with no help from trazodone prescribed at last visit. Pt reports random episodes of "feeling very anxious," with increased respiratory rate / heart rate, sweating, nervousness, palpitations, that last 10-15 minutes and cause significant distress. He is concerned these are panic attacks. He cannot quantify how many happen per week, but they do interfere with his sleep. He has taken Xanax before and feels it helped him, and requests a prescription of this, today.  Review of Systems: As above.     Objective:   Physical Exam BP 127/85 mmHg  Pulse 65  Temp(Src) 97.6 F (36.4 C) (Oral)  Ht _0  (1.651 m)  Wt 140 lb 14.4 oz (63.912 kg)  BMI 23.45 kg/m2 Gen: well-appearing adult male in NAD Cardio: RRR, no murmur appreciated Pulm: CTAB, faint wheeze that clears with cough in left mid-lung field; normal work of breathing Abd: soft, nontender, BS+ Ext: warm, well-perfused, no LE edema     Assessment & Plan:  See problem list notes.

## 2014-05-18 NOTE — Assessment & Plan Note (Signed)
A: Doing well after recent exacerbation that cleared with course of prednisone. Needs refill of Spiriva but not Dulera or albuterol. Exam reassuring. Review of pulmonary function test from last year (which were done during an acute exacerbation, at time of diagnosis) showed FEV1 and FEV1/FVC suggestive of GOLD stage II COPD. Note pt is still occasionally smoking.  P: Discussed staging of COPD at length and described how COPD can worsen over time, especially with smoking. Strongly counseled to stop smoking, entirely; pt declined formal cessation assistance at this time. Consider referral to pulmonology in the future; pt and I feel they would not add much at this time as he is generally doing very well. Refilled Spiriva. Continue Dulera (requires [place] Medicaid prior auth; pt exempt / meets clinical criteria due to diagnosis of COPD), albuterol. F/u in 4-5 months or sooner if needed.

## 2014-05-18 NOTE — Assessment & Plan Note (Signed)
Continued to reiterate recommendation for complete cessation, especially given diagnosis of COPD. F/u PRN; continue counseling regularly, pt declines formal cessation assistance, currently.

## 2014-05-18 NOTE — Patient Instructions (Signed)
Thank you for coming in, today!  I will refill your Spiriva today. Call your pharmacy when you need the [facility] and the albuterol, and I can fill those without you having to come in. If you would like in the future, I will be happy to refer you to the pulmonologists (lung doctors) in town. Their office name is [facility]. There are several doctors there. Call me if you would like to be refer to them.  For your sleep, stop the trazodone. I will start you on Xanax 0.5 mg twice a day as needed for anxiety / sleep. I will start with 60 tablets. Let's see how long that lasts. If that lasts 30 days, come back to see me to talk about it again to see if we need to adjust things. If 60 tablets lasts 2-3 months, call me and tell me you need a refill.  Otherwise, come back to see me in 4-5 months or sooner as needed. My last day is June 30th of this year. After that, you'll have a different doctor here in this same building. Please feel free to call with any questions or concerns at any time, at [phone]. --Dr. Venetia Maxon

## 2014-06-21 ENCOUNTER — Other Ambulatory Visit: Payer: Self-pay | Admitting: Family Medicine

## 2014-06-21 MED ORDER — ALPRAZOLAM 0.5 MG PO TABS: 0.2500 mg | ORAL_TABLET | Freq: Two times a day (BID) | ORAL | Status: DC | PRN

## 2014-06-21 NOTE — Telephone Encounter (Signed)
Red Team: Please let pt know he can pick up a paper Rx for Xanax, 60 tablets with 1 refill (two or more month's worth) at the front desk. He can follow up with me, as needed. Please also ask him to come back to see me before the refill runs out on this prescription, to see how things are going with his anxiety. Thanks. --CMS

## 2014-06-21 NOTE — Telephone Encounter (Signed)
Pt is requesting refill on xanax

## 2014-06-22 ENCOUNTER — Other Ambulatory Visit: Payer: Self-pay | Admitting: Family Medicine

## 2014-06-22 NOTE — Telephone Encounter (Signed)
Pt called and needs a refill on his xanax. Please call patient when ready to pick up. jw

## 2014-06-22 NOTE — Telephone Encounter (Signed)
Pt informed. Deseree Blount, CMA  

## 2014-06-22 NOTE — Telephone Encounter (Signed)
Already filled 2/16. Printed Rx left at front desk. Please let pt know. Thanks! --CMS

## 2014-06-23 NOTE — Telephone Encounter (Signed)
Patient informed (see previous note)

## 2014-06-30 ENCOUNTER — Ambulatory Visit: Payer: Medicaid Other | Admitting: Family Medicine

## 2014-08-16 ENCOUNTER — Emergency Department (HOSPITAL_COMMUNITY): Payer: Medicaid Other

## 2014-08-16 ENCOUNTER — Encounter (HOSPITAL_COMMUNITY): Payer: Self-pay | Admitting: Emergency Medicine

## 2014-08-16 ENCOUNTER — Emergency Department (HOSPITAL_COMMUNITY)
Admission: EM | Admit: 2014-08-16 | Discharge: 2014-08-16 | Disposition: A | Discharge status: Home / Self Care | Payer: Medicaid Other | Attending: Emergency Medicine | Admitting: Emergency Medicine

## 2014-08-16 DIAGNOSIS — Z7951 Long term (current) use of inhaled steroids: Secondary | ICD-10-CM | POA: Insufficient documentation

## 2014-08-16 DIAGNOSIS — Z79899 Other long term (current) drug therapy: Secondary | ICD-10-CM | POA: Insufficient documentation

## 2014-08-16 DIAGNOSIS — R21 Rash and other nonspecific skin eruption: Secondary | ICD-10-CM | POA: Diagnosis not present

## 2014-08-16 DIAGNOSIS — J441 Chronic obstructive pulmonary disease with (acute) exacerbation: Secondary | ICD-10-CM | POA: Insufficient documentation

## 2014-08-16 DIAGNOSIS — I10 Essential (primary) hypertension: Secondary | ICD-10-CM | POA: Diagnosis not present

## 2014-08-16 DIAGNOSIS — Z87891 Personal history of nicotine dependence: Secondary | ICD-10-CM | POA: Diagnosis not present

## 2014-08-16 DIAGNOSIS — R5383 Other fatigue: Secondary | ICD-10-CM | POA: Diagnosis present

## 2014-08-16 DIAGNOSIS — Z8709 Personal history of other diseases of the respiratory system: Secondary | ICD-10-CM

## 2014-08-16 LAB — URINALYSIS, ROUTINE W REFLEX MICROSCOPIC
Glucose, UA: NEGATIVE mg/dL
HGB URINE DIPSTICK: NEGATIVE
Ketones, ur: NEGATIVE mg/dL
Leukocytes, UA: NEGATIVE
Nitrite: NEGATIVE
Protein, ur: NEGATIVE mg/dL
Specific Gravity, Urine: 1.03 (ref 1.005–1.030)
UROBILINOGEN UA: 2 mg/dL — AB (ref 0.0–1.0)
pH: 6.5 (ref 5.0–8.0)

## 2014-08-16 LAB — CBC
HEMATOCRIT: 43 % (ref 39.0–52.0)
Hemoglobin: 14.5 g/dL (ref 13.0–17.0)
MCH: 29.2 pg (ref 26.0–34.0)
MCHC: 33.7 g/dL (ref 30.0–36.0)
MCV: 86.5 fL (ref 78.0–100.0)
Platelets: 277 10*3/uL (ref 150–400)
RBC: 4.97 MIL/uL (ref 4.22–5.81)
RDW: 13.4 % (ref 11.5–15.5)
WBC: 10.2 10*3/uL (ref 4.0–10.5)

## 2014-08-16 LAB — BASIC METABOLIC PANEL
ANION GAP: 10 (ref 5–15)
BUN: 14 mg/dL (ref 6–23)
CHLORIDE: 107 mmol/L (ref 96–112)
CO2: 24 mmol/L (ref 19–32)
Calcium: 9.7 mg/dL (ref 8.4–10.5)
Creatinine, Ser: 0.94 mg/dL (ref 0.50–1.35)
GFR calc Af Amer: >90 mL/min (ref >90)
GFR calc non Af Amer: 90 mL/min — ABNORMAL LOW (ref >90)
Glucose, Bld: 89 mg/dL (ref 70–99)
POTASSIUM: 4.2 mmol/L (ref 3.5–5.1)
Sodium: 141 mmol/L (ref 135–145)

## 2014-08-16 LAB — I-STAT TROPONIN, ED: Troponin i, poc: 0 ng/mL (ref 0.00–0.08)

## 2014-08-16 MED ORDER — PENICILLIN G BENZATHINE 1200000 UNIT/2ML IM SUSP
2.4000 10*6.[IU] | Freq: Once | INTRAMUSCULAR | Status: AC
  Administered 2014-08-16: 2.4 10*6.[IU] via INTRAMUSCULAR
  Filled 2014-08-16: qty 4

## 2014-08-16 NOTE — ED Provider Notes (Signed)
CSN: [id number]     Arrival date & time 08/16/14  1138 History   First MD Initiated Contact with Patient 08/16/14 1512     Chief Complaint  Patient presents with  . Shortness of Breath  . Fatigue  . Penis Injury     (Consider location/radiation/quality/duration/timing/severity/associated sxs/prior Treatment) HPI Gavin Kane is a 59 year-old male with pmhx of COPD, HTN who presents to the ER with c/o DOE.  Pt states his s/s have been present over the past several months, and progressively worsening.  Pt states he has been compliant with his home therapy, and has not had any symptoms at rest.  Pt states strenuous activity aggravate his symptoms and rest alleviates.  Pt denies associated chest pain, HA, dizziness, weakness, nausea, vomiting, palpitations, abdominal pain, syncope.  Patient denies any recent illness, cough, nasal congestion, fever.  Pt also presents c/o a sore on his penis.  Pt states he has noticed this over the past 2 wks.  patient states the sore is painless, and he states he had some amoxicillin at home that he began taking which this seemed to "improve his sore". Patient states he has one current sexual partner, and last had intercourse approximately one month ago. Patient reports unprotected sex. Patient denies fever, abdominal pain, penile discharge, dysuria, testicular swelling or pain.  Past Medical History  Diagnosis Date  . COPD (chronic obstructive pulmonary disease)   . HTN (hypertension)    Past Surgical History  Procedure Laterality Date  . Hemorrhoid surgery     Family History  Problem Relation Age of Onset  . Cancer      Aunt   History  Substance Use Topics  . Smoking status: Former Smoker -- 0.75 packs/day for 40 years    Types: Cigarettes  . Smokeless tobacco: Former Systems developer    Quit date: 07/21/2012  . Alcohol Use: Yes     Comment: occ    Review of Systems  Constitutional: Negative for fever.  HENT: Negative for trouble swallowing.   Eyes:  Negative for visual disturbance.  Respiratory: Positive for shortness of breath. Negative for cough.   Cardiovascular: Negative for chest pain.  Gastrointestinal: Negative for nausea, vomiting and abdominal pain.  Genitourinary: Negative for dysuria.  Musculoskeletal: Negative for neck pain.  Skin: Negative for rash.  Neurological: Negative for dizziness, weakness and numbness.  Psychiatric/Behavioral: Negative.       Allergies  Review of patient's allergies indicates no known allergies.  Home Medications   Prior to Admission medications   Medication Sig Start Date End Date Taking? Authorizing Provider  albuterol (PROVENTIL HFA;VENTOLIN HFA) 108 (90 BASE) MCG/ACT inhaler Inhale 2 puffs into the lungs every 6 (six) hours as needed for wheezing or shortness of breath. 03/03/14  Yes Sharon Mt Street, MD  ALPRAZolam Duanne Moron) 0.5 MG tablet Take 0.5 tablets (0.25 mg total) by mouth 2 (two) times daily as needed for anxiety. 06/21/14  Yes Sharon Mt Street, MD  ibuprofen (ADVIL,MOTRIN) 200 MG tablet Take 200 mg by mouth every 6 (six) hours as needed for mild pain or moderate pain.   Yes Historical Provider, MD  mometasone-formoterol (DULERA) 200-5 MCG/ACT AERO Inhale 2 puffs into the lungs 2 (two) times daily. 03/03/14  Yes Sharon Mt Street, MD  tiotropium (SPIRIVA) 18 MCG inhalation capsule Place 1 capsule (18 mcg total) into inhaler and inhale daily. 05/18/14  Yes Sharon Mt Street, MD  predniSONE (DELTASONE) 50 MG tablet Take one tablet, PO, daily for 5 days. 04/30/14  Marissa Sciacca, PA-C   BP 136/87 mmHg  Pulse 84  Temp(Src) 98.1 F (36.7 C) (Oral)  Resp 16  SpO2 94% Physical Exam  Constitutional: He is oriented to person, place, and time. He appears well-developed and well-nourished. No distress.  HENT:  Head: Normocephalic and atraumatic.  Mouth/Throat: Oropharynx is clear and moist. No oropharyngeal exudate.  Eyes: Pupils are equal, round, and reactive to light.  Right eye exhibits no discharge. Left eye exhibits no discharge. No scleral icterus.  Neck: Normal range of motion.  Cardiovascular: Normal rate, regular rhythm and normal heart sounds.   No murmur heard. Pulmonary/Chest: Effort normal and breath sounds normal. No accessory muscle usage. No tachypnea. No respiratory distress.  Abdominal: Soft. There is no tenderness. Hernia confirmed negative in the right inguinal area and confirmed negative in the left inguinal area.  Genitourinary: Penis normal. Cremasteric reflex is present. Right testis shows no mass, no swelling and no tenderness. Right testis is descended. Cremasteric reflex is not absent on the right side. Left testis shows no mass, no swelling and no tenderness. Left testis is descended. Cremasteric reflex is not absent on the left side. Uncircumcised. No phimosis, paraphimosis, hypospadias, penile erythema or penile tenderness. No discharge found.  Small, 0.5 x 0.5 area of lightening of skin noted to anterior aspect of the foreskin. No erythema, ulceration, tenderness, penile discharge noted. No raised margins or erythema. No lymphadenopathy noted. No testicular pain, tenderness or swelling. Chaperone present during entire GU exam.  Musculoskeletal: Normal range of motion. He exhibits no edema or tenderness.  Neurological: He is alert and oriented to person, place, and time. No cranial nerve deficit. Coordination normal.  Skin: Skin is warm and dry. No rash noted. He is not diaphoretic.  Psychiatric: He has a normal mood and affect.  Nursing note and vitals reviewed.   ED Course  Procedures (including critical care time) Labs Review Labs Reviewed  BASIC METABOLIC PANEL - Abnormal; Notable for the following:    GFR calc non Af Amer 90 (*)    All other components within normal limits  URINALYSIS, ROUTINE W REFLEX MICROSCOPIC - Abnormal; Notable for the following:    Color, Urine AMBER (*)    APPearance CLOUDY (*)    Bilirubin Urine  SMALL (*)    Urobilinogen, UA 2.0 (*)    All other components within normal limits  CBC  RPR  HIV ANTIBODY (ROUTINE TESTING)  I-STAT TROPOININ, ED  GC/CHLAMYDIA PROBE AMP ([place])    Imaging Review Dg Chest 2 View (if Patient Has Fever And/or Copd)  08/16/2014   CLINICAL DATA:  Cough and difficulty breathing  EXAM: CHEST  2 VIEW  COMPARISON:  April 30, 2014  FINDINGS: There is no edema or consolidation. Heart size and pulmonary vascularity are normal. No adenopathy. There is mid thoracic levoscoliosis.  IMPRESSION: No edema or consolidation.   Electronically Signed   By: Lowella Grip III M.D.   On: 08/16/2014 12:47     EKG Interpretation   Date/Time:  Tuesday August 16 2014 14:38:10 EDT Ventricular Rate:  60 PR Interval:  170 QRS Duration: 80 QT Interval:  400 QTC Calculation: 400 R Axis:   43 Text Interpretation:  Normal sinus rhythm Nonspecific ST and T wave  abnormality Abnormal ECG No significant change was found Confirmed by  [facility]  MD, TREY ([id number]) on 08/16/2014 4:29:24 PM      MDM   Final diagnoses:  Rash and nonspecific skin eruption  History of COPD  1. COPD Patient's signs and symptoms of dyspnea on exertion most consistent with possible worsening of his COPD. Patient hemodynamically stable, afebrile, non-tachycardic, nontachypneic, non-hypoxic, well-appearing and in no acute distress. Wells criteria 0 for PE. No concern for ACS. No concern for COPD exacerbation or pneumonia. Patient to be discharged and instructed to follow up with his primary care physician regarding his history of COPD.  2. Penile lesion Lesion is not consistent with syphilitic chancre, however RPR was sent. Patient at low risk for STDs, however STD panel was sent. Patient currently asymptomatic. Recommending patient follow-up with primary care physician.  Patient hemodynamically stable, afebrile, non-tachycardic, nontachypneic, non-hypoxic, well-appearing and in no acute  distress throughout ER stay. Although there is low concern for syphilis, given the fact the patient had an painless ulceration on his penis that has since improved, it is difficult to assess fully having some improvement. We'll treat with IM penicillin here. Patient strongly encouraged to follow-up with his primary care physician and return precautions were discussed. Patient verbalized understanding and agreement of this plan. Encouraged patient to call or return to the ER with any worsening of symptoms or should he have any questions or concerns.  BP 136/87 mmHg  Pulse 84  Temp(Src) 98.1 F (36.7 C) (Oral)  Resp 16  SpO2 94%  Signed,  Dahlia Bailiff, PA-C 4:17 PM  Patient discussed with Dr. Artis Delay. MD  Dahlia Bailiff, PA-C 08/17/14 [place], MD 08/19/14 [phone]

## 2014-08-16 NOTE — ED Notes (Signed)
Pt states shortness of breath that comes and goes with or without activity over the last 3 weeks. Increasing fatigue with everyday activities, denies pain, nausea, vomiting, diarrhea, fever, chills, cough. Pt states he also has had a new sore on his penis without having any new sexual partners recently and he wants to get it checked out as well.

## 2014-08-16 NOTE — Discharge Instructions (Signed)
Chronic Obstructive Pulmonary Disease °Chronic obstructive pulmonary disease (COPD) is a common lung condition in which airflow from the lungs is limited. COPD is a general term that can be used to describe many different lung problems that limit airflow, including both chronic bronchitis and emphysema.  If you have COPD, your lung function will probably never return to normal, but there are measures you can take to improve lung function and make yourself feel better.  °CAUSES  °· Smoking (common).   °· Exposure to secondhand smoke.   °· Genetic problems. °· Chronic inflammatory lung diseases or recurrent infections. °SYMPTOMS  °· Shortness of breath, especially with physical activity.   °· Deep, persistent (chronic) cough with a large amount of thick mucus.   °· Wheezing.   °· Rapid breaths (tachypnea).   °· Gray or bluish discoloration (cyanosis) of the skin, especially in fingers, toes, or lips.   °· Fatigue.   °· Weight loss.   °· Frequent infections or episodes when breathing symptoms become much worse (exacerbations).   °· Chest tightness. °DIAGNOSIS  °Your health care provider will take a medical history and perform a physical examination to make the initial diagnosis.  Additional tests for COPD may include:  °· Lung (pulmonary) function tests. °· Chest X-ray. °· CT scan. °· Blood tests. °TREATMENT  °Treatment available to help you feel better when you have COPD includes:  °· Inhaler and nebulizer medicines. These help manage the symptoms of COPD and make your breathing more comfortable. °· Supplemental oxygen. Supplemental oxygen is only helpful if you have a low oxygen level in your blood.   °· Exercise and physical activity. These are beneficial for nearly all people with COPD. Some people may also benefit from a pulmonary rehabilitation program. °HOME CARE INSTRUCTIONS  °· Take all medicines (inhaled or pills) as directed by your health care provider. °· Avoid over-the-counter medicines or cough syrups  that dry up your airway (such as antihistamines) and slow down the elimination of secretions unless instructed otherwise by your health care provider.   °· If you are a smoker, the most important thing that you can do is stop smoking. Continuing to smoke will cause further lung damage and breathing trouble. Ask your health care provider for help with quitting smoking. He or she can direct you to community resources or hospitals that provide support. °· Avoid exposure to irritants such as smoke, chemicals, and fumes that aggravate your breathing. °· Use oxygen therapy and pulmonary rehabilitation if directed by your health care provider. If you require home oxygen therapy, ask your health care provider whether you should purchase a pulse oximeter to measure your oxygen level at home.   °· Avoid contact with individuals who have a contagious illness. °· Avoid extreme temperature and humidity changes. °· Eat healthy foods. Eating smaller, more frequent meals and resting before meals may help you maintain your strength. °· Stay active, but balance activity with periods of rest. Exercise and physical activity will help you maintain your ability to do things you want to do. °· Preventing infection and hospitalization is very important when you have COPD. Make sure to receive all the vaccines your health care provider recommends, especially the pneumococcal and influenza vaccines. Ask your health care provider whether you need a pneumonia vaccine. °· Learn and use relaxation techniques to manage stress. °· Learn and use controlled breathing techniques as directed by your health care provider. Controlled breathing techniques include:   °· Pursed lip breathing. Start by breathing in (inhaling) through your nose for 1 second. Then, purse your lips as if you were   going to whistle and breathe out (exhale) through the pursed lips for 2 seconds.   Diaphragmatic breathing. Start by putting one hand on your abdomen just above  your waist. Inhale slowly through your nose. The hand on your abdomen should move out. Then purse your lips and exhale slowly. You should be able to feel the hand on your abdomen moving in as you exhale.   Learn and use controlled coughing to clear mucus from your lungs. Controlled coughing is a series of short, progressive coughs. The steps of controlled coughing are:   Lean your head slightly forward.   Breathe in deeply using diaphragmatic breathing.   Try to hold your breath for 3 seconds.   Keep your mouth slightly open while coughing twice.   Spit any mucus out into a tissue.   Rest and repeat the steps once or twice as needed. SEEK MEDICAL CARE IF:   You are coughing up more mucus than usual.   There is a change in the color or thickness of your mucus.   Your breathing is more labored than usual.   Your breathing is faster than usual.  SEEK IMMEDIATE MEDICAL CARE IF:   You have shortness of breath while you are resting.   You have shortness of breath that prevents you from:  Being able to talk.   Performing your usual physical activities.   You have chest pain lasting longer than 5 minutes.   Your skin color is more cyanotic than usual.  You measure low oxygen saturations for longer than 5 minutes with a pulse oximeter. MAKE SURE YOU:   Understand these instructions.  Will watch your condition.  Will get help right away if you are not doing well or get worse. Document Released: 01/30/2005 Document Revised: 09/06/2013 Document Reviewed: 12/17/2012 [facility] Patient Information 2015 [place], [place]. This information is not intended to replace advice given to you by your health care provider. Make sure you discuss any questions you have with your health care provider.  Sexually Transmitted Disease A sexually transmitted disease (STD) is a disease or infection that may be passed (transmitted) from person to person, usually during sexual activity.  This may happen by way of saliva, semen, blood, vaginal mucus, or urine. Common STDs include:   Gonorrhea.   Chlamydia.   Syphilis.   HIV and AIDS.   Genital herpes.   Hepatitis B and C.   Trichomonas.   Human papillomavirus (HPV).   Pubic lice.   Scabies.  Mites.  Bacterial vaginosis. WHAT ARE CAUSES OF STDs? An STD may be caused by bacteria, a virus, or parasites. STDs are often transmitted during sexual activity if one person is infected. However, they may also be transmitted through nonsexual means. STDs may be transmitted after:   Sexual intercourse with an infected person.   Sharing sex toys with an infected person.   Sharing needles with an infected person or using unclean piercing or tattoo needles.  Having intimate contact with the genitals, mouth, or rectal areas of an infected person.   Exposure to infected fluids during birth. WHAT ARE THE SIGNS AND SYMPTOMS OF STDs? Different STDs have different symptoms. Some people may not have any symptoms. If symptoms are present, they may include:   Painful or bloody urination.   Pain in the pelvis, abdomen, vagina, anus, throat, or eyes.   A skin rash, itching, or irritation.  Growths, ulcerations, blisters, or sores in the genital and anal areas.  Abnormal vaginal discharge with or  without bad odor.   Penile discharge in men.   Fever.   Pain or bleeding during sexual intercourse.   Swollen glands in the groin area.   Yellow skin and eyes (jaundice). This is seen with hepatitis.   Swollen testicles.  Infertility.  Sores and blisters in the mouth. HOW ARE STDs DIAGNOSED? To make a diagnosis, your health care provider may:   Take a medical history.   Perform a physical exam.   Take a sample of any discharge to examine.  Swab the throat, cervix, opening to the penis, rectum, or vagina for testing.  Test a sample of your first morning urine.   Perform blood tests.    Perform a Pap test, if this applies.   Perform a colposcopy.   Perform a laparoscopy.  HOW ARE STDs TREATED? Treatment depends on the STD. Some STDs may be treated but not cured.   Chlamydia, gonorrhea, trichomonas, and syphilis can be cured with antibiotic medicine.   Genital herpes, hepatitis, and HIV can be treated, but not cured, with prescribed medicines. The medicines lessen symptoms.   Genital warts from HPV can be treated with medicine or by freezing, burning (electrocautery), or surgery. Warts may come back.   HPV cannot be cured with medicine or surgery. However, abnormal areas may be removed from the cervix, vagina, or vulva.   If your diagnosis is confirmed, your recent sexual partners need treatment. This is true even if they are symptom-free or have a negative culture or evaluation. They should not have sex until their health care providers say it is okay. HOW CAN I REDUCE MY RISK OF GETTING AN STD? Take these steps to reduce your risk of getting an STD:  Use latex condoms, dental dams, and water-soluble lubricants during sexual activity. Do not use petroleum jelly or oils.  Avoid having multiple sex partners.  Do not have sex with someone who has other sex partners.  Do not have sex with anyone you do not know or who is at high risk for an STD.  Avoid risky sex practices that can break your skin.  Do not have sex if you have open sores on your mouth or skin.  Avoid drinking too much alcohol or taking illegal drugs. Alcohol and drugs can affect your judgment and put you in a vulnerable position.  Avoid engaging in oral and anal sex acts.  Get vaccinated for HPV and hepatitis. If you have not received these vaccines in the past, talk to your health care provider about whether one or both might be right for you.   If you are at risk of being infected with HIV, it is recommended that you take a prescription medicine daily to prevent HIV infection. This  is called pre-exposure prophylaxis (PrEP). You are considered at risk if:  You are a man who has sex with other men (MSM).  You are a heterosexual man or woman and are sexually active with more than one partner.  You take drugs by injection.  You are sexually active with a partner who has HIV.  Talk with your health care provider about whether you are at high risk of being infected with HIV. If you choose to begin PrEP, you should first be tested for HIV. You should then be tested every 3 months for as long as you are taking PrEP.  WHAT SHOULD I DO IF I THINK I HAVE AN STD?  See your health care provider.   Tell your sexual partner(s). They  should be tested and treated for any STDs.  Do not have sex until your health care provider says it is okay. WHEN SHOULD I GET IMMEDIATE MEDICAL CARE? Contact your health care provider right away if:   You have severe abdominal pain.  You are a man and notice swelling or pain in your testicles.  You are a woman and notice swelling or pain in your vagina. Document Released: 07/13/2002 Document Revised: 04/27/2013 Document Reviewed: 11/10/2012 [facility] Patient Information 2015 [place], [place]. This information is not intended to replace advice given to you by your health care provider. Make sure you discuss any questions you have with your health care provider.

## 2014-08-17 LAB — HIV ANTIBODY (ROUTINE TESTING W REFLEX): HIV Screen 4th Generation wRfx: NONREACTIVE

## 2014-08-17 LAB — GC/CHLAMYDIA PROBE AMP (~~LOC~~) NOT AT ARMC
CHLAMYDIA, DNA PROBE: NEGATIVE
NEISSERIA GONORRHEA: NEGATIVE

## 2014-08-17 LAB — RPR: RPR Ser Ql: NONREACTIVE

## 2014-08-22 ENCOUNTER — Telehealth: Payer: Self-pay | Admitting: Family Medicine

## 2014-08-22 NOTE — Telephone Encounter (Signed)
Appointment tomorrow is fine. If he wants to see me then, he can, or he can wait, either way. Thanks! --CMS

## 2014-08-22 NOTE — Telephone Encounter (Signed)
Pt said that he spoke to someone on Friday and they told him to call today and make an appt with Dr. Venetia Maxon for tomorrow 08/23/2014, to follow up for his COPD. The only thing available for tomorrow and the rest of this week are same day appointments. If this is okay please call the patient and set him up for one of these appts. If this is not okay, the patient has already made an appt for next Monday / thanks [employer], ASA

## 2014-08-29 ENCOUNTER — Ambulatory Visit (INDEPENDENT_AMBULATORY_CARE_PROVIDER_SITE_OTHER): Payer: Medicaid Other | Admitting: Family Medicine

## 2014-08-29 ENCOUNTER — Encounter: Payer: Self-pay | Admitting: Family Medicine

## 2014-08-29 VITALS — BP 144/92 | HR 65 | Temp 97.8°F | Ht 65.0 in | Wt 140.0 lb

## 2014-08-29 DIAGNOSIS — J449 Chronic obstructive pulmonary disease, unspecified: Secondary | ICD-10-CM

## 2014-08-29 DIAGNOSIS — R030 Elevated blood-pressure reading, without diagnosis of hypertension: Secondary | ICD-10-CM

## 2014-08-29 DIAGNOSIS — F411 Generalized anxiety disorder: Secondary | ICD-10-CM

## 2014-08-29 DIAGNOSIS — Z716 Tobacco abuse counseling: Secondary | ICD-10-CM

## 2014-08-29 DIAGNOSIS — R5382 Chronic fatigue, unspecified: Secondary | ICD-10-CM | POA: Diagnosis not present

## 2014-08-29 DIAGNOSIS — IMO0001 Reserved for inherently not codable concepts without codable children: Secondary | ICD-10-CM

## 2014-08-29 MED ORDER — MOMETASONE FURO-FORMOTEROL FUM 200-5 MCG/ACT IN AERO: 2.0000 | INHALATION_SPRAY | Freq: Two times a day (BID) | RESPIRATORY_TRACT | Status: DC

## 2014-08-29 MED ORDER — TIOTROPIUM BROMIDE MONOHYDRATE 18 MCG IN CAPS: 18.0000 ug | ORAL_CAPSULE | Freq: Every day | RESPIRATORY_TRACT | Status: DC

## 2014-08-29 MED ORDER — ALBUTEROL SULFATE HFA 108 (90 BASE) MCG/ACT IN AERS: 2.0000 | INHALATION_SPRAY | Freq: Four times a day (QID) | RESPIRATORY_TRACT | Status: DC | PRN

## 2014-08-29 MED ORDER — ALPRAZOLAM 1 MG PO TABS: 0.5000 mg | ORAL_TABLET | Freq: Two times a day (BID) | ORAL | Status: DC | PRN

## 2014-08-29 NOTE — Assessment & Plan Note (Signed)
A: Reasonably well-controlled clinically with Spiriva, Dulera, and albuterol, though some persistent daily symptoms, overall consistent with previously-determined GOLD stage II. Some concomitant chronic fatigue and intermittent HTN / elevated BP. Pt is still smoking "occasionally." Exam overall reassuring.  P: Discussed increasing medications / change to a different set of medications, sleep study, and / or referral to pulmonology. Pt prefers discussing with specialist first before doing further testing or changing medications, so referral to pulm placed, today. Continue Dulera, Spiriva, and PRN albuterol; refills sent in, today. See separate problem list notes, otherwise. F/u as needed.

## 2014-08-29 NOTE — Progress Notes (Signed)
   Subjective:    Patient ID: Gavin Kane, male    DOB: Jul 29, 1955, 59 y.o.   MRN: [id number]  HPI: Pt presents to clinic for f/u of COPD. He was seen at the hospital in the ED about a week ago for shortness of breath and fatigue. He had a lesion on his genitals, which has now resolved; he was given a shot of PCN in the ED, and RPR and HIV were negative. Labs in the ED were "normal," per report. Overall, fatigue, SOB despite treatment for COPD, and poor sleep have been chronic issues and overall have not been better despite compliance with Dulera, Spiriva, and albuterol. He has no cough, fevers, chills, rashes. He has never had a sleep study. Note pt does still smoke "occasionally" (less than 1-2 cigarettes per day, on average), per his report.  Review of Systems: As above. Of note, does use Xanax every day which helps with sleep / anxiety, but he feels the does is not strong enough.     Objective:   Physical Exam BP 144/92 mmHg  Pulse 65  Temp(Src) 97.8 F (36.6 C) (Oral)  Ht 5\' 5"  (1.651 m)  Wt 140 lb (63.504 kg)  BMI 23.30 kg/m2  Manual recheck BP very similar (number not recorded) Gen: well-appearing adult male in NAD HEENT: [place]/AT, EOMI, PERRLA, MMM, TM's clear bilaterally  Nasal mucosae and posterior oropharynx clear Neck: supple, normal ROM, no lymphadenopathy Cardio: RRR, no murmur appreciated Pulm: CTAB throughout, minimal end-expiratory wheezes with protracted expiratory phase  Some crackles at the bases that clear with deep cough  Generally normal WOB Abd: soft, nontender, BS+ Ext: warm, well-perfused, no LE edema     Assessment & Plan:  See problem list notes.

## 2014-08-29 NOTE — Assessment & Plan Note (Signed)
Anxiety-type symptoms with occasional "attacks," helped with Xanax, though pt feels 0.25 mg dosing is not helpful and at times has used 0.5 mg, with better help. Trial of Xanax 0.5 mg tablets, 0.25-0.5 mg BID #60 with 1 refill has lasted >3 months, so comfortable increasing to 1 mg tablets, #60, with 1 refill, with change of sig to 0.5 to 1 mg BID PRN. Will f/u as needed, otherwise. Need to consider addition of counseling / non-pharmacologic therapy, as well, in the future.

## 2014-08-29 NOTE — Assessment & Plan Note (Signed)
Mild elevation, today, consistent with previous checks in the past, but pt has also had normal BP's in between elevation checks and likely has multifactorial issues (i.e., BP interrelated with pulmonary function). Pt remains hesitant to take antihypertensive medications, at this point. Defer new medication at this time, but would prefer amlodipine or HCTZ in the future, if needed. Monitor routinely at f/u; advised smoking cessation and reduced salt in diet, otherwise.

## 2014-08-29 NOTE — Patient Instructions (Signed)
Thank you for coming in, today!  I will check several labs, today. I will call you or send you a letter with the result. I refilled all your COPD medicines.  I will refer you to the lung doctors here in town. They can talk to you about any other testing or medication adjustments.  For your anxiety, I will go up on your Xanax to 1 mg tablets. You can take 0.5 mg (half-tablet) or 1 mg (whole tablet) up to twice per day.  Come back to see me sometime after you see the lung doctors. If I need to see you sooner based on your labs, I'll let you know. Please feel free to call with any questions or concerns at any time, at [phone]. --Dr. Venetia Maxon

## 2014-08-29 NOTE — Assessment & Plan Note (Signed)
"  Occasional" smoking remains; continued to strongly encourage complete cessation, especially given diagnosis of COPD with concomitant persistent SOB and fatigue. Continue counseling at f/u visits. Pt has repeatedly declined formal cessation assistance.

## 2014-08-29 NOTE — Assessment & Plan Note (Signed)
A: Chronic in nature, very likely multifactorial with overall poor lung function, poor sleep, questionable inconsistent compliance with lung disease medications. Possible component of abnormal sleep breathing (no prior sleep study), doubt frank anemia, heart failure, or other primary process. No symptoms to suggest malignancy, but pt has not had colonoscopy. Last TSH ~2 years ago was normal.  P: Referred to pulmonology today and will defer to them for further discussion for sleep study. Will check TSH, iron panel, B12. Strongly encouraged pt to stop smoking completely (declined formal intervention, today) and to get colonoscopy. F/u as needed, otherwise.

## 2014-08-30 ENCOUNTER — Encounter: Payer: Self-pay | Admitting: Family Medicine

## 2014-08-30 LAB — IRON AND TIBC
%SAT: 35 % (ref 20–55)
IRON: 100 ug/dL (ref 42–165)
TIBC: 284 ug/dL (ref 215–435)
UIBC: 184 ug/dL (ref 125–400)

## 2014-08-30 LAB — VITAMIN B12: Vitamin B-12: 568 pg/mL (ref 211–911)

## 2014-08-30 LAB — TSH: TSH: 1.322 u[IU]/mL (ref 0.350–4.500)

## 2014-08-30 NOTE — Progress Notes (Signed)
I was preceptor the day of this visit.   

## 2014-09-07 ENCOUNTER — Encounter: Payer: Self-pay | Admitting: Pulmonary Disease

## 2014-09-07 ENCOUNTER — Ambulatory Visit (INDEPENDENT_AMBULATORY_CARE_PROVIDER_SITE_OTHER): Payer: Medicaid Other | Admitting: Pulmonary Disease

## 2014-09-07 VITALS — BP 124/86 | HR 61 | Ht 65.0 in | Wt 143.0 lb

## 2014-09-07 DIAGNOSIS — R0602 Shortness of breath: Secondary | ICD-10-CM | POA: Diagnosis not present

## 2014-09-07 DIAGNOSIS — B360 Pityriasis versicolor: Secondary | ICD-10-CM

## 2014-09-07 DIAGNOSIS — J432 Centrilobular emphysema: Secondary | ICD-10-CM

## 2014-09-07 MED ORDER — SELENIUM SULFIDE 2.5 % EX LOTN: 1.0000 "application " | TOPICAL_LOTION | Freq: Every day | CUTANEOUS | Status: DC | PRN

## 2014-09-07 NOTE — Progress Notes (Signed)
   Subjective:    Patient ID: Gavin Kane, male    DOB: [birth year], 59 y.o.   MRN: [id number]  HPI  PCP- [facility]  59 year old smoker presents for evaluation of COPD. He moved from [place] do not 11/12/2011. He used to work as a Museum/gallery curator. He is disabled since 2014. He reports dyspnea on exertion, NYHA class 2-3 for the past 3 years. He also reports increasing fatigue is unable to take the trash can out of his driveway. He has been taking 2O and Spiriva with limited benefit. He had 2 ED visits in 04/2014 and 08/2014. -He smoked about half pack per day, about 45 pack years and now only smokes occasionally for the past 2 years. He denies chest pain, palpitations, pedal edema or paroxysmal nocturnal dyspnea. He denies frequent wheezing or chest colds  08/2014 Spirometry -FEV1 1.13 -42%, ratio 56, FVC 60% 10/2012 stress Myoview-normal.  Past Medical History  Diagnosis Date  . COPD (chronic obstructive pulmonary disease)   . HTN (hypertension)     Past Surgical History  Procedure Laterality Date  . Hemorrhoid surgery     No Known Allergies  History   Social History  . Marital Status: Single    Spouse Name: N/A  . Number of Children: 2  . Years of Education: N/A   Occupational History  . Disability    Social History Main Topics  . Smoking status: Former Smoker -- 0.75 packs/day for 40 years    Types: Cigarettes  . Smokeless tobacco: Former Systems developer    Quit date: 07/21/2012  . Alcohol Use: 0.0 oz/week    0 Standard drinks or equivalent per week     Comment: Social  . Drug Use: No  . Sexual Activity: Not on file   Other Topics Concern  . 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 Never married.  2 children.     Family History  Problem Relation Age of Onset  . Cancer      Aunt     Review of Systems  Constitutional: Negative for fever, chills, activity change, appetite change  and unexpected weight change.  HENT: Negative for congestion, dental problem, postnasal drip, rhinorrhea, sneezing, sore throat, trouble swallowing and voice change.   Eyes: Negative for visual disturbance.  Respiratory: Positive for shortness of breath. Negative for cough and choking.   Cardiovascular: Negative for chest pain and leg swelling.  Gastrointestinal: Negative for nausea, vomiting and abdominal pain.  Genitourinary: Negative for difficulty urinating.  Musculoskeletal: Negative for arthralgias.  Skin: Negative for rash.  Psychiatric/Behavioral: Negative for behavioral problems and confusion.       Objective:   Physical Exam  Gen. Pleasant, well-nourished, in no distress, normal affect ENT - no lesions, no post nasal drip Neck: No JVD, no thyromegaly, no carotid bruits Lungs: no use of accessory muscles, no dullness to percussion, decreased BS BL without rales or rhonchi  Cardiovascular: Rhythm regular, heart sounds  normal, no murmurs or gallops, no peripheral edema Abdomen: soft and non-tender, no hepatosplenomegaly, BS normal. Musculoskeletal: No deformities, no cyanosis or clubbing Neuro:  alert, non focal       Assessment & Plan:

## 2014-09-07 NOTE — Patient Instructions (Addendum)
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

## 2014-09-07 NOTE — Assessment & Plan Note (Addendum)
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

## 2014-09-07 NOTE — Assessment & Plan Note (Signed)
Selsun shampoo for [place]

## 2014-10-31 ENCOUNTER — Other Ambulatory Visit: Payer: Self-pay | Admitting: Family Medicine

## 2014-10-31 MED ORDER — ALBUTEROL SULFATE HFA 108 (90 BASE) MCG/ACT IN AERS: 2.0000 | INHALATION_SPRAY | Freq: Four times a day (QID) | RESPIRATORY_TRACT | Status: DC | PRN

## 2014-10-31 MED ORDER — ALPRAZOLAM 1 MG PO TABS: 0.5000 mg | ORAL_TABLET | Freq: Two times a day (BID) | ORAL | Status: DC | PRN

## 2014-10-31 MED ORDER — MOMETASONE FURO-FORMOTEROL FUM 200-5 MCG/ACT IN AERO: 2.0000 | INHALATION_SPRAY | Freq: Two times a day (BID) | RESPIRATORY_TRACT | Status: DC

## 2014-10-31 MED ORDER — TIOTROPIUM BROMIDE MONOHYDRATE 18 MCG IN CAPS: 18.0000 ug | ORAL_CAPSULE | Freq: Every day | RESPIRATORY_TRACT | Status: DC

## 2014-10-31 NOTE — Telephone Encounter (Signed)
Pt called and needs a refill on his Xanax and all his breathing medications. Please call and let him know so he can pick up. Call [phone] or [phone]. jw

## 2014-10-31 NOTE — Telephone Encounter (Signed)
Red Team: Please let pt know he can pick up an Rx for Xanax at the front desk, and that I sent in his breathing medications to [facility] on [address]. Thanks! --CMS

## 2014-11-01 NOTE — Telephone Encounter (Signed)
Left message on voicemail informing that rx was ready to be picked up. 

## 2014-12-21 ENCOUNTER — Ambulatory Visit: Payer: Medicaid Other | Admitting: Family Medicine

## 2014-12-29 ENCOUNTER — Telehealth: Payer: Self-pay | Admitting: Family Medicine

## 2014-12-29 NOTE — Telephone Encounter (Signed)
Glenda from [facility] is calling because they faxed a form for orders for home care for this pt. She would like a status on this request as well as to ensure that it was indeed received. Sadie Reynolds, ASA

## 2015-01-04 NOTE — Telephone Encounter (Signed)
Called [place]. Informed her that I have not met this patient before. B/c of this I did a review of his prior visits. From that review it does not seem to me as though he is in any state in which basic ADLs are not being met. I will happily reassess this if the patient makes an appointment with me. But until that time I will not be signing these forms.  Please see that this patient schedules an appointment if he still desires to have this paperwork filled out.  Loa Socks

## 2015-01-10 NOTE — Telephone Encounter (Signed)
Dr. Ree Kida,  This patient will be asking about disability paperwork to be filled out. I have never met this patient. Looking through his chart I can't see any reason he would need disability >> but that is why he needs to be seen. I placed the paperwork in your box. That is just as a reference >> If you think what he is asking for is appropriate then I'll take your word for it and I can fill out the forms, but I thought you should at least see what he's asking for.  Your appointment w/ him is on Thursday (9/8)  Thank you! -Loa Socks

## 2015-01-10 NOTE — Telephone Encounter (Signed)
You didn't have any openings until October. Pt expressed that the appt time is too far out. I scheduled him with dr Gavin Kane this week. Liberty Seto Kennon Holter, CMA

## 2015-01-12 ENCOUNTER — Encounter: Payer: Self-pay | Admitting: Family Medicine

## 2015-01-12 ENCOUNTER — Ambulatory Visit (INDEPENDENT_AMBULATORY_CARE_PROVIDER_SITE_OTHER): Payer: Medicaid Other | Admitting: Family Medicine

## 2015-01-12 VITALS — BP 114/65 | HR 74 | Temp 98.0°F | Wt 131.0 lb

## 2015-01-12 DIAGNOSIS — J432 Centrilobular emphysema: Secondary | ICD-10-CM

## 2015-01-12 DIAGNOSIS — R5382 Chronic fatigue, unspecified: Secondary | ICD-10-CM | POA: Diagnosis not present

## 2015-01-12 DIAGNOSIS — F411 Generalized anxiety disorder: Secondary | ICD-10-CM | POA: Diagnosis not present

## 2015-01-12 MED ORDER — SERTRALINE HCL 50 MG PO TABS: 50.0000 mg | ORAL_TABLET | Freq: Every day | ORAL | Status: DC

## 2015-01-12 MED ORDER — ALPRAZOLAM 1 MG PO TABS: 0.5000 mg | ORAL_TABLET | Freq: Two times a day (BID) | ORAL | Status: DC | PRN

## 2015-01-12 NOTE — Patient Instructions (Addendum)
It was nice to meet you today.  COPD - please continue your current breathing medications, Dr. Ree Kida will complete the paperwork for home health.  Fatigue - I think that your fatigue is mostly related to your COPD, Dr. Venetia Maxon looked for other causes and the workup was negative.  Anxiety - Dr. Ree Kida will start Zoloft 50 mg daily. Please call Dr. Gwenlyn Saran (card provided) for counseling/cognitive behavior therapy to help with anxiety.   Please return in one month.

## 2015-01-12 NOTE — Assessment & Plan Note (Signed)
GAD 15. Very anxious on exam today. -start Zoloft 50 mg daily -refill for xanax provided (wean as tolerated) -return in one month for follow up -patient amenable to CBT after he has stable transportation (forms for [facility] provided)

## 2015-01-12 NOTE — Assessment & Plan Note (Addendum)
Stable on current regimen of Dulera/Spiriva/Albuterol. -continue to have regular follow up with PCP and Pulmonology -encouraged complete cessation of smoking -forms for home health completed (see scanned documents)

## 2015-01-12 NOTE — Progress Notes (Signed)
Subjective:     Patient ID: Gavin Kane, male   DOB: 03-29-1956, 59 y.o.   MRN: [id number]  H&P written by Kara Dies Pisanie MS3 under supervision from Dr. Ree Kida  HPI   Patient needs home health paperwork filled out. Would like help around the home. Unable to perform multiple ADL's without shortness of breath.   ANXIETY: Patient describes feeling anxious all the time. He describes a heat racing, feeling nervous, intense worrying, and not being able to go outside and play with his grand children. He denies any chest pain with these episodes. Patient is currently taking 1mg  Xanax and explains that sometimes he must take 2 of them just for his anxiety to go away. He finds himself running out of the xanax at times leaving him anxious. No previous treatment with SSRI or CBT. He admits to some depression as he is unable to perform all of the normal activities that he would like to due to sob.   COPD/ FATIGUE: Patient describes intense shortness of breath when performing activities around the house. It feels as though he has just run up a hill. He has stopped smoking, he occasionally takes a puff here or there. He is so fatigued and that is why he is inquiring about home health. He would like home health to help him with making his breakfast and other things around the house. Reports compliance with Spiriva and Dulera. Recently established care with Dr. Elsworth Soho (pulmonology).  Social - still smokes a cigarette every few days  Review of Systems  Pertinent ROS as per HPI    Objective:   Physical Exam  Filed Vitals:   01/12/15 1130  BP: 114/65  Pulse: 74  Temp: 98 F (36.7 C)    GEN: NAD, well appearing PSYCH: Patient is anxious appearing, affect is normal. Patient interrupts interviewer multiple times. Tangential in responses and fidgety during interview.    GAD-7 score: 15  PHQ-9 score: 7    Assessment and Plan:   ANXIETY: Patient has severe anxiety, which is inhibiting his daily life.  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 Anxiety workup for etiology was negative. - Begin zoloft  - Refilled 1mg  Xanax with plans to wean.  COPD/FATIGUE: At baseline, will continue to monitor, advised patient to continue current regimen.  - Will fill in home health for the patient. - Provided pt with information on [facility] transport  [employer].   I personally saw and evaluated the patient. I have reviewed the medical student note and agree with the documentation. I personally completed the physical exam. Additions to the medical student note are made in blue.   Dossie Arbour MD

## 2015-01-12 NOTE — Assessment & Plan Note (Signed)
Chronic fatigue is unchanged from previous exams. Metabolic workup completed by Dr. Venetia Maxon at last visit unremarkable (TSH/CBC/B12). Suspect multifactorial from COPD and anxiety. -see problem specific plans

## 2015-02-16 ENCOUNTER — Ambulatory Visit (INDEPENDENT_AMBULATORY_CARE_PROVIDER_SITE_OTHER): Payer: Medicaid Other | Admitting: Family Medicine

## 2015-02-16 ENCOUNTER — Encounter: Payer: Self-pay | Admitting: Family Medicine

## 2015-02-16 VITALS — BP 137/83 | HR 64 | Temp 98.6°F | Ht 65.0 in | Wt 130.0 lb

## 2015-02-16 DIAGNOSIS — F411 Generalized anxiety disorder: Secondary | ICD-10-CM | POA: Diagnosis not present

## 2015-02-16 DIAGNOSIS — J432 Centrilobular emphysema: Secondary | ICD-10-CM | POA: Diagnosis not present

## 2015-02-16 MED ORDER — ALBUTEROL SULFATE HFA 108 (90 BASE) MCG/ACT IN AERS: 2.0000 | INHALATION_SPRAY | Freq: Four times a day (QID) | RESPIRATORY_TRACT | Status: DC | PRN

## 2015-02-16 MED ORDER — MOMETASONE FURO-FORMOTEROL FUM 200-5 MCG/ACT IN AERO: 2.0000 | INHALATION_SPRAY | Freq: Two times a day (BID) | RESPIRATORY_TRACT | Status: DC

## 2015-02-16 MED ORDER — CITALOPRAM HYDROBROMIDE 20 MG PO TABS: ORAL_TABLET | ORAL | Status: DC

## 2015-02-16 MED ORDER — ALPRAZOLAM 1 MG PO TABS
0.5000 mg | ORAL_TABLET | Freq: Three times a day (TID) | ORAL | Status: DC | PRN
Start: 2015-02-16 — End: 2015-03-24

## 2015-02-16 MED ORDER — TIOTROPIUM BROMIDE MONOHYDRATE 18 MCG IN CAPS: 18.0000 ug | ORAL_CAPSULE | Freq: Every day | RESPIRATORY_TRACT | Status: DC

## 2015-02-16 NOTE — Patient Instructions (Signed)
It was a pleasure seeing you today in our clinic. Today we discussed your anxiety. Here is the treatment plan we have discussed and agreed upon together:   - I have placed you on Celexa. Take one tablet once a day for one week. Then take 2 tabs once a day after that. - I would like for you to see me back in one month.

## 2015-02-16 NOTE — Assessment & Plan Note (Signed)
Patient continues to express symptoms of anxiety. He is taking 3 tablets of Xanax a day for these symptoms. He self discontinued his new prescription of Zoloft after 1 week. Patient and I had a long discussion about SSRIs and how they take 4-6 weeks for having an effect. Patient stated his understanding and his willingness to continue SSRI therapy. However, he would like to try new medication. - Celexa 20 mg 7 days, 40 mg every day after that. - I've discontinued his Zoloft prescription - I've refilled his Xanax prescription with 90 tablets provided. I was very clear in discussing the fact that I do not want him to be taking his amount of medication long-term. Patient stated his understanding.

## 2015-02-16 NOTE — Progress Notes (Signed)
   HPI  CC: Anxiety Patient is here for a follow-up appointment for anxiety. At his last visit he was started on Zoloft. He states that this medication did not work and he stopped taking it after one week of treatment. He continues to have these issues of anxiety and he takes up to 3 tablets of Xanax every day. He states that this is the only thing that has helped curb his anxiety. The symptoms he possesses appear to be the same as they were at the previous visit.  Patient also states that he needs refills on his COPD medications.  ROS: Denies suicidal/homicidal ideations. Denies headaches, blurred vision, shortness of breath, chest pain, nausea/vomiting/diarrhea, diaphoresis, hallucinations, fever. Endorses some tremors, panicked feeling, irritability, anxiety   Past medical history and social history reviewed and updated in the EMR as appropriate.  Objective: BP 137/83 mmHg  Pulse 64  Temp(Src) 98.6 F (37 C) (Oral)  Ht 5\' 5"  (1.651 m)  Wt 130 lb (58.968 kg)  BMI 21.63 kg/m2  SpO2 97% Gen: NAD, alert, cooperative, agitated  CV: RRR, no murmur Resp: CTAB, no wheezes, non-labored Neuro: Alert and oriented, Speech clear, No gross deficits  Assessment and plan:  Anxiety state Patient continues to express symptoms of anxiety. He is taking 3 tablets of Xanax a day for these symptoms. He self discontinued his new prescription of Zoloft after 1 week. Patient and I had a long discussion about SSRIs and how they take 4-6 weeks for having an effect. Patient stated his understanding and his willingness to continue SSRI therapy. However, he would like to try new medication. - Celexa 20 mg 7 days, 40 mg every day after that. - I've discontinued his Zoloft prescription - I've refilled his Xanax prescription with 90 tablets provided. I was very clear in discussing the fact that I do not want him to be taking his amount of medication long-term. Patient stated his understanding.  COPD (chronic  obstructive pulmonary disease) Refilled patient's albuterol, Dulera, and Spiriva.    Meds ordered this encounter  Medications  . citalopram (CELEXA) 20 MG tablet    Sig: Take one tablet (20mg ) every day for the first 7 days. Then take 2 tablets (40mg ) every day after that.    Dispense:  53 tablet    Refill:  0  . ALPRAZolam (XANAX) 1 MG tablet    Sig: Take 0.5-1 tablets (0.5-1 mg total) by mouth 3 (three) times daily as needed for anxiety.    Dispense:  90 tablet    Refill:  0  . albuterol (PROVENTIL HFA;VENTOLIN HFA) 108 (90 BASE) MCG/ACT inhaler    Sig: Inhale 2 puffs into the lungs every 6 (six) hours as needed for wheezing or shortness of breath.    Dispense:  1 Inhaler    Refill:  5  . mometasone-formoterol (DULERA) 200-5 MCG/ACT AERO    Sig: Inhale 2 puffs into the lungs 2 (two) times daily.    Dispense:  1 Inhaler    Refill:  3  . tiotropium (SPIRIVA) 18 MCG inhalation capsule    Sig: Place 1 capsule (18 mcg total) into inhaler and inhale daily.    Dispense:  90 capsule    Refill:  1     Elberta Leatherwood, MD,MS,  PGY2 02/16/2015 6:27 PM

## 2015-02-16 NOTE — Assessment & Plan Note (Signed)
Refilled patient's albuterol, Dulera, and Spiriva.

## 2015-02-19 ENCOUNTER — Other Ambulatory Visit: Payer: Self-pay | Admitting: Family Medicine

## 2015-02-20 NOTE — Telephone Encounter (Signed)
Refilled this at last visit (last week)

## 2015-02-26 ENCOUNTER — Emergency Department (HOSPITAL_COMMUNITY)
Admission: EM | Admit: 2015-02-26 | Discharge: 2015-02-26 | Disposition: A | Discharge status: Home / Self Care | Payer: Medicaid Other | Attending: Emergency Medicine | Admitting: Emergency Medicine

## 2015-02-26 ENCOUNTER — Encounter (HOSPITAL_COMMUNITY): Payer: Self-pay

## 2015-02-26 ENCOUNTER — Emergency Department (HOSPITAL_COMMUNITY): Payer: Medicaid Other

## 2015-02-26 DIAGNOSIS — J441 Chronic obstructive pulmonary disease with (acute) exacerbation: Secondary | ICD-10-CM | POA: Insufficient documentation

## 2015-02-26 DIAGNOSIS — Z720 Tobacco use: Secondary | ICD-10-CM | POA: Diagnosis not present

## 2015-02-26 DIAGNOSIS — Z79899 Other long term (current) drug therapy: Secondary | ICD-10-CM | POA: Insufficient documentation

## 2015-02-26 DIAGNOSIS — R05 Cough: Secondary | ICD-10-CM | POA: Diagnosis present

## 2015-02-26 DIAGNOSIS — Z7951 Long term (current) use of inhaled steroids: Secondary | ICD-10-CM | POA: Diagnosis not present

## 2015-02-26 DIAGNOSIS — I10 Essential (primary) hypertension: Secondary | ICD-10-CM | POA: Insufficient documentation

## 2015-02-26 DIAGNOSIS — J40 Bronchitis, not specified as acute or chronic: Secondary | ICD-10-CM

## 2015-02-26 MED ORDER — ALBUTEROL SULFATE (2.5 MG/3ML) 0.083% IN NEBU
5.0000 mg | INHALATION_SOLUTION | Freq: Once | RESPIRATORY_TRACT | Status: AC
  Administered 2015-02-26: 5 mg via RESPIRATORY_TRACT
  Filled 2015-02-26: qty 6

## 2015-02-26 MED ORDER — IPRATROPIUM BROMIDE 0.02 % IN SOLN
0.5000 mg | Freq: Once | RESPIRATORY_TRACT | Status: AC
  Administered 2015-02-26: 0.5 mg via RESPIRATORY_TRACT
  Filled 2015-02-26: qty 2.5

## 2015-02-26 MED ORDER — AZITHROMYCIN 250 MG PO TABS
500.0000 mg | ORAL_TABLET | Freq: Once | ORAL | Status: AC
  Administered 2015-02-26: 500 mg via ORAL
  Filled 2015-02-26: qty 2

## 2015-02-26 MED ORDER — AZITHROMYCIN 250 MG PO TABS: 250.0000 mg | ORAL_TABLET | Freq: Every day | ORAL | Status: DC

## 2015-02-26 NOTE — ED Notes (Signed)
Pt with cough/congestion x 7-8 days.  HX of COPD.  No fever.  Hx of pneumonia with same symptoms. Using inhaler at home which helps.  Productive cough

## 2015-02-26 NOTE — ED Notes (Signed)
MD at bedside. 

## 2015-02-26 NOTE — ED Provider Notes (Signed)
CSN: [id number]     Arrival date & time 02/26/15  0831 History   First MD Initiated Contact with Patient 02/26/15 [phone]     Chief Complaint  Patient presents with  . Cough  . Nasal Congestion     (Consider location/radiation/quality/duration/timing/severity/associated sxs/prior Treatment) Patient is a 59 y.o. male presenting with cough. The history is provided by the patient.  Cough Cough characteristics:  Productive Sputum characteristics:  Yellow Severity:  Moderate Onset quality:  Gradual Duration:  6 days Timing:  Constant Progression:  Worsening Chronicity:  New Smoker: yes   Context: upper respiratory infection   Relieved by:  Nothing Worsened by:  Nothing tried Ineffective treatments: TheraFlu. Associated symptoms: rhinorrhea, shortness of breath and wheezing   Associated symptoms: no chest pain, no chills, no ear fullness, no ear pain and no fever   Risk factors: no recent travel     Past Medical History  Diagnosis Date  . COPD (chronic obstructive pulmonary disease) ([place])   . HTN (hypertension)    Past Surgical History  Procedure Laterality Date  . Hemorrhoid surgery     Family History  Problem Relation Age of Onset  . Cancer      Aunt   Social History  Substance Use Topics  . Smoking status: Current Some Day Smoker -- 0.75 packs/day for 40 years    Types: Cigarettes  . Smokeless tobacco: Former Systems developer    Quit date: 07/21/2012     Comment: "take a puff every once in a while"  . Alcohol Use: 0.0 oz/week    0 Standard drinks or equivalent per week     Comment: Social    Review of Systems  Constitutional: Negative for fever and chills.  HENT: Positive for rhinorrhea. Negative for ear pain.   Respiratory: Positive for cough, shortness of breath and wheezing.   Cardiovascular: Negative for chest pain.  All other systems reviewed and are negative.     Allergies  Review of patient's allergies indicates no known allergies.  Home Medications    Prior to Admission medications   Medication Sig Start Date End Date Taking? Authorizing Provider  albuterol (PROVENTIL HFA;VENTOLIN HFA) 108 (90 BASE) MCG/ACT inhaler Inhale 2 puffs into the lungs every 6 (six) hours as needed for wheezing or shortness of breath. 02/16/15   Elberta Leatherwood, MD  ALPRAZolam Duanne Moron) 1 MG tablet Take 0.5-1 tablets (0.5-1 mg total) by mouth 3 (three) times daily as needed for anxiety. 02/16/15   Elberta Leatherwood, MD  citalopram (CELEXA) 20 MG tablet Take one tablet (20mg ) every day for the first 7 days. Then take 2 tablets (40mg ) every day after that. 02/16/15   Elberta Leatherwood, MD  mometasone-formoterol (DULERA) 200-5 MCG/ACT AERO Inhale 2 puffs into the lungs 2 (two) times daily. 02/16/15   Elberta Leatherwood, MD  selenium sulfide (SELSUN) 2.5 % shampoo Apply 1 application topically daily as needed for irritation. 09/07/14   Rigoberto Noel, MD  tiotropium (SPIRIVA) 18 MCG inhalation capsule Place 1 capsule (18 mcg total) into inhaler and inhale daily. 02/16/15   Elberta Leatherwood, MD   There were no vitals taken for this visit. Physical Exam  Constitutional: He is oriented to person, place, and time. He appears well-developed and well-nourished. No distress.  HENT:  Head: Normocephalic and atraumatic.  Right Ear: Tympanic membrane normal.  Left Ear: Tympanic membrane normal.  Nose: Mucosal edema and rhinorrhea present.  Mouth/Throat: Oropharynx is clear and moist. No oropharyngeal exudate, posterior oropharyngeal  edema or posterior oropharyngeal erythema.  Eyes: Conjunctivae and EOM are normal. Pupils are equal, round, and reactive to light.  Neck: Normal range of motion. Neck supple.  Cardiovascular: Normal rate, regular rhythm and intact distal pulses.   No murmur heard. Pulmonary/Chest: Effort normal. No respiratory distress. He has decreased breath sounds. He has no wheezes. He has no rales.  Musculoskeletal: Normal range of motion. He exhibits no edema or tenderness.   Neurological: He is alert and oriented to person, place, and time.  Skin: Skin is warm and dry. No rash noted. No erythema.  Psychiatric: He has a normal mood and affect. His behavior is normal.  Nursing note and vitals reviewed.   ED Course  Procedures (including critical care time) Labs Review Labs Reviewed - No data to display  Imaging Review Dg Chest 2 View  02/26/2015  CLINICAL DATA:  Productive cough and shortness of breath 1 week. EXAM: CHEST  2 VIEW COMPARISON:  08/16/2014 FINDINGS: Lungs are clear. Flattening of the hemidiaphragms on the lateral film. Cardiomediastinal silhouette and remainder of the exam is unchanged. IMPRESSION: No active cardiopulmonary disease. Electronically Signed   By: Marin Olp M.D.   On: 02/26/2015 09:48   I have personally reviewed and evaluated these images and lab results as part of my medical decision-making.   EKG Interpretation None      MDM   Final diagnoses:  Bronchitis    Patient with a history of COPD who uses albuterol at home presents today with a six-day history of productive cough and shortness of breath it's not improving. He denies fever. He has not received a flu shot this year. Prior history of pneumonia.  Nontoxic appearing on exam and can speak in full sentences. He does have decreased breath sounds throughout without wheezing. Patient given albuterol and Atrovent. Chest x-ray pending. Oxygen saturation 97% on room air with no signs of tachypnea.  10:12 AM Chest x-ray is within normal limits. There are air movement after albuterol and Atrovent. Patient given a Z-Pak given history of COPD to cover atypical infection and he was discharged home.   Blanchie Dessert, MD 02/26/15 1013

## 2015-03-21 ENCOUNTER — Telehealth: Payer: Self-pay | Admitting: Family Medicine

## 2015-03-21 NOTE — Telephone Encounter (Signed)
Pt called and would like a refill on his Xanax. jw

## 2015-03-23 ENCOUNTER — Telehealth: Payer: Self-pay | Admitting: Family Medicine

## 2015-03-23 NOTE — Telephone Encounter (Signed)
Will resend to MD to see if patient can receive enough to last until his appt. Jazmin Hartsell,CMA

## 2015-03-23 NOTE — Telephone Encounter (Signed)
Pt called to check on status of this request. Pt was very displeased and would like for me to reiterate to Dr. Alease Frame that upon their last encounter he informed provider that he would be leaving out of town. The soonest available that he could get an appointment with Dr. Alease Frame was April 13, 2015. Pt would like to request enough to at least get him to his appt. Please follow up with Mr. Lounsberry. Thank you, Fonda Kinder, ASA

## 2015-03-23 NOTE — Telephone Encounter (Signed)
Error

## 2015-03-23 NOTE — Telephone Encounter (Signed)
error 

## 2015-03-24 ENCOUNTER — Other Ambulatory Visit: Payer: Self-pay | Admitting: Family Medicine

## 2015-03-24 MED ORDER — ALPRAZOLAM 1 MG PO TABS: 0.5000 mg | ORAL_TABLET | Freq: Three times a day (TID) | ORAL | Status: DC | PRN

## 2015-03-24 NOTE — Telephone Encounter (Signed)
Will forward to MD. Novah Nessel,CMA  

## 2015-03-24 NOTE — Telephone Encounter (Signed)
Patient is here to check on the status of this request. Gavin Kane, ASA

## 2015-03-24 NOTE — Telephone Encounter (Signed)
Tried to call patient multiple times and whoever picked up the line continued to hang up.  Please let patient know that his script is ready if he calls back.  Dagmawi Venable,CMA

## 2015-03-24 NOTE — Telephone Encounter (Signed)
Script left up front.

## 2015-03-24 NOTE — Telephone Encounter (Signed)
Refill for 1 month provided. Patient needs an appt for these refills in the future. I WILL NOT do this in the future.

## 2015-04-13 ENCOUNTER — Encounter: Payer: Self-pay | Admitting: Family Medicine

## 2015-04-13 ENCOUNTER — Ambulatory Visit (INDEPENDENT_AMBULATORY_CARE_PROVIDER_SITE_OTHER): Payer: Medicaid Other | Admitting: Family Medicine

## 2015-04-13 VITALS — BP 148/90 | HR 96 | Temp 98.2°F | Ht 65.0 in | Wt 135.0 lb

## 2015-04-13 DIAGNOSIS — F411 Generalized anxiety disorder: Secondary | ICD-10-CM | POA: Diagnosis not present

## 2015-04-13 MED ORDER — CITALOPRAM HYDROBROMIDE 20 MG PO TABS: ORAL_TABLET | ORAL | Status: DC

## 2015-04-13 MED ORDER — ALPRAZOLAM 1 MG PO TABS: 0.5000 mg | ORAL_TABLET | Freq: Three times a day (TID) | ORAL | Status: DC | PRN

## 2015-04-13 NOTE — Patient Instructions (Addendum)
It was a pleasure seeing you today in our clinic. Today we discussed your medications. Here is the treatment plan we have discussed and agreed upon together:   - I refilled your medications as we discussed in the office.

## 2015-04-13 NOTE — Assessment & Plan Note (Addendum)
Patient is here for medication refill. At our last visit I initiated Celexa, and he states that the Celexa hasn't been doing much for him. Patient states he still has some pills left however 100% compliance would have had him running out of this medication about 6 weeks ago. Patient insists that the Xanax he is prescribed is the only medication that seems to work for his anxiety. He does not know why I will not prescribe more than one month's worth of medication. - I have refilled his Celexa and encouraged him to take this medication as prescribed with improved compliance. - I've refilled his Xanax as previously prescribed, with additional refills to last a total of 3 months. - I attempted to discuss with him the need to reduce his overall Xanax use. Patient stated his understanding however seemed fairly dismissive at that time. I have asked patient to follow-up in 3 months. At that visit I will likely break the news that I will be initiating a gradual weaning process from Xanax. During that time I will insist upon better compliance with a more appropriate long-term medication. Regardless of this compliance I will still be performing the gradual weaning process off of this short acting benzodiazepine as I do not believe that it is good practice for patient to be on this amount of short acting medication from this class of medications PERIOD (but especially not w/o appropriate longer acting medications on board). I will also be performing a UDS at that time.

## 2015-04-13 NOTE — Progress Notes (Signed)
HPI  CC: Medication refill. Patient is here for a medication refill. On initial presentation patient was significantly agitated and irritable. Patient felt as though he had to wait too long after arrival to our clinic before he was seen by the physician (~22min). Patient also does not understand why he must have an appointment every month for his prescription of Xanax. I explained to him that this medication is a controlled substance and that I do not take these substances lightly.  Patient denies any significant change to his symptoms. At our last visit I initiated SSRI treatment with Celexa. Patient states that he has been taking this however he does not notice much change. He states that he still has some tablets left (he should've run out of tablets over a month ago). I discussed with him the need to take this medication every day. Patient stated his understanding. Patient is very abrasive today to both provider and staff. When discussion about limiting the amount of Xanax he takes daily patient was very dismissive, and stated that he had places to be.    This type of behavior does not bode well for my willingness to prescribe this type of medication to this patient in the future.  ROS: Patient denies any headache, diaphoresis, fever, chills, chest pain, shortness of breath, nausea, vomiting, diarrhea.  Past medical history and social history reviewed and updated in the EMR as appropriate.  Objective: BP 148/90 mmHg  Pulse 96  Temp(Src) 98.2 F (36.8 C) (Oral)  Ht 5\' 5"  (1.651 m)  Wt 135 lb (61.236 kg)  BMI 22.47 kg/m2  SpO2 99% Gen: NAD, alert, irritable and abrasive CV: RRR, no murmur Resp: CTAB, no wheezes, non-labored Ext: No edema, warm, no tremor noted Neuro: Alert and oriented, Speech clear, No gross deficits  Assessment and plan:  Anxiety state Patient is here for medication refill. At our last visit I initiated Celexa, and he states that the Celexa hasn't been doing  much for him. Patient states he still has some pills left however 100% compliance would have had him running out of this medication about 6 weeks ago. Patient insists that the Xanax he is prescribed is the only medication that seems to work for his anxiety. He does not know why I will not prescribe more than one month's worth of medication. - I have refilled his Celexa and encouraged him to take this medication as prescribed with improved compliance. - I've refilled his Xanax as previously prescribed, with additional refills to last a total of 3 months. - I attempted to discuss with him the need to reduce his overall Xanax use. Patient stated his understanding however seemed fairly dismissive at that time. I have asked patient to follow-up in 3 months. At that visit I will likely break the news that I will be initiating a gradual weaning process from Xanax. During that time I will insist upon better compliance with a more appropriate long-term medication. Regardless of this compliance I will still be performing the gradual weaning process off of this short acting benzodiazepine as I do not believe that it is good practice for patient to be on this amount of short acting medication from this class of medications PERIOD (but especially not w/o appropriate longer acting medications on board). I will also be performing a UDS at that time.    Meds ordered this encounter  Medications  . DISCONTD: ALPRAZolam (XANAX) 1 MG tablet    Sig: Take 0.5-1 tablets (0.5-1 mg total) by  mouth 3 (three) times daily as needed for anxiety.    Dispense:  90 tablet    Refill:  0  . DISCONTD: ALPRAZolam (XANAX) 1 MG tablet    Sig: Take 0.5-1 tablets (0.5-1 mg total) by mouth 3 (three) times daily as needed for anxiety.    Dispense:  90 tablet    Refill:  0    Do not fill until 05/14/15  . ALPRAZolam (XANAX) 1 MG tablet    Sig: Take 0.5-1 tablets (0.5-1 mg total) by mouth 3 (three) times daily as needed for anxiety.     Dispense:  90 tablet    Refill:  0    Do not fill until 06/14/15  . citalopram (CELEXA) 20 MG tablet    Sig: Take one tablet (20mg ) every day for the first 7 days. Then take 2 tablets (40mg ) every day after that.    Dispense:  60 tablet    Refill:  5     Elberta Leatherwood, MD,MS,  PGY2 04/13/2015 6:36 PM

## 2015-05-16 ENCOUNTER — Encounter (HOSPITAL_COMMUNITY): Payer: Self-pay | Admitting: *Deleted

## 2015-05-16 ENCOUNTER — Emergency Department (HOSPITAL_COMMUNITY): Payer: Medicaid Other

## 2015-05-16 ENCOUNTER — Emergency Department (HOSPITAL_COMMUNITY)
Admission: EM | Admit: 2015-05-16 | Discharge: 2015-05-16 | Disposition: A | Discharge status: Home / Self Care | Payer: Medicaid Other | Attending: Emergency Medicine | Admitting: Emergency Medicine

## 2015-05-16 DIAGNOSIS — Z79899 Other long term (current) drug therapy: Secondary | ICD-10-CM | POA: Diagnosis not present

## 2015-05-16 DIAGNOSIS — Z7951 Long term (current) use of inhaled steroids: Secondary | ICD-10-CM | POA: Insufficient documentation

## 2015-05-16 DIAGNOSIS — I10 Essential (primary) hypertension: Secondary | ICD-10-CM | POA: Diagnosis not present

## 2015-05-16 DIAGNOSIS — R0602 Shortness of breath: Secondary | ICD-10-CM | POA: Diagnosis present

## 2015-05-16 DIAGNOSIS — J440 Chronic obstructive pulmonary disease with acute lower respiratory infection: Secondary | ICD-10-CM | POA: Diagnosis not present

## 2015-05-16 DIAGNOSIS — J40 Bronchitis, not specified as acute or chronic: Secondary | ICD-10-CM

## 2015-05-16 DIAGNOSIS — F17210 Nicotine dependence, cigarettes, uncomplicated: Secondary | ICD-10-CM | POA: Insufficient documentation

## 2015-05-16 DIAGNOSIS — J441 Chronic obstructive pulmonary disease with (acute) exacerbation: Secondary | ICD-10-CM

## 2015-05-16 MED ORDER — ALBUTEROL SULFATE HFA 108 (90 BASE) MCG/ACT IN AERS: 2.0000 | INHALATION_SPRAY | RESPIRATORY_TRACT | Status: DC | PRN

## 2015-05-16 MED ORDER — AZITHROMYCIN 250 MG PO TABS: ORAL_TABLET | ORAL | Status: DC

## 2015-05-16 MED ORDER — IPRATROPIUM BROMIDE 0.02 % IN SOLN
0.5000 mg | Freq: Once | RESPIRATORY_TRACT | Status: AC
  Administered 2015-05-16: 0.5 mg via RESPIRATORY_TRACT
  Filled 2015-05-16: qty 2.5

## 2015-05-16 MED ORDER — ALBUTEROL SULFATE (2.5 MG/3ML) 0.083% IN NEBU
5.0000 mg | INHALATION_SOLUTION | Freq: Once | RESPIRATORY_TRACT | Status: AC
  Administered 2015-05-16: 5 mg via RESPIRATORY_TRACT
  Filled 2015-05-16: qty 6

## 2015-05-16 MED ORDER — PREDNISONE 20 MG PO TABS
60.0000 mg | ORAL_TABLET | Freq: Once | ORAL | Status: AC
  Administered 2015-05-16: 60 mg via ORAL
  Filled 2015-05-16: qty 3

## 2015-05-16 MED ORDER — PREDNISONE 20 MG PO TABS: 60.0000 mg | ORAL_TABLET | Freq: Every day | ORAL | Status: DC

## 2015-05-16 NOTE — ED Provider Notes (Signed)
CSN: [id number]     Arrival date & time 05/16/15  0830 History   First MD Initiated Contact with Patient 05/16/15 [phone]     No chief complaint on file.    (Consider location/radiation/quality/duration/timing/severity/associated sxs/prior Treatment) The history is provided by the patient.  Patient w hx copd, presents with increased cough, and sob in the past 2-3 days. Cough episodic, persistent. Sob moderate. Occasionally productive w yellowish phlegm.  +wheezing. No sore throat or runny nose. No chest pain. No body aches or headaches. No flu shot this year.  Uses MDI daily at baseline.  No current/recent prednisone/oral steroid use. No fevers. No swelling. No orthopnea.     Past Medical History  Diagnosis Date  . COPD (chronic obstructive pulmonary disease) ([place])   . HTN (hypertension)    Past Surgical History  Procedure Laterality Date  . Hemorrhoid surgery     Family History  Problem Relation Age of Onset  . Cancer      Aunt   Social History  Substance Use Topics  . Smoking status: Current Some Day Smoker -- 0.75 packs/day for 40 years    Types: Cigarettes  . Smokeless tobacco: Former Systems developer    Quit date: 07/21/2012     Comment: "take a puff every once in a while"  . Alcohol Use: 0.0 oz/week    0 Standard drinks or equivalent per week     Comment: Social    Review of Systems  Constitutional: Negative for fever and chills.  HENT: Negative for sore throat.   Eyes: Negative for redness.  Respiratory: Positive for cough, shortness of breath and wheezing.   Cardiovascular: Negative for chest pain.  Gastrointestinal: Negative for vomiting, abdominal pain and diarrhea.  Endocrine: Negative for polyuria.  Genitourinary: Negative for flank pain.  Musculoskeletal: Negative for back pain and neck pain.  Skin: Negative for rash.  Neurological: Negative for headaches.  Hematological: Does not bruise/bleed easily.  Psychiatric/Behavioral: Negative for confusion.       Allergies  Review of patient's allergies indicates no known allergies.  Home Medications   Prior to Admission medications   Medication Sig Start Date End Date Taking? Authorizing Provider  albuterol (PROVENTIL HFA;VENTOLIN HFA) 108 (90 BASE) MCG/ACT inhaler Inhale 2 puffs into the lungs every 6 (six) hours as needed for wheezing or shortness of breath. 02/16/15   Elberta Leatherwood, MD  ALPRAZolam Duanne Moron) 1 MG tablet Take 0.5-1 tablets (0.5-1 mg total) by mouth 3 (three) times daily as needed for anxiety. 04/13/15   Elberta Leatherwood, MD  citalopram (CELEXA) 20 MG tablet Take one tablet (20mg ) every day for the first 7 days. Then take 2 tablets (40mg ) every day after that. 04/13/15   Elberta Leatherwood, MD  mometasone-formoterol (DULERA) 200-5 MCG/ACT AERO Inhale 2 puffs into the lungs 2 (two) times daily. 02/16/15   Elberta Leatherwood, MD  selenium sulfide (SELSUN) 2.5 % shampoo Apply 1 application topically daily as needed for irritation. 09/07/14   Rigoberto Noel, MD  tiotropium (SPIRIVA) 18 MCG inhalation capsule Place 1 capsule (18 mcg total) into inhaler and inhale daily. 02/16/15   Elberta Leatherwood, MD   There were no vitals taken for this visit. Physical Exam  Constitutional: He is oriented to person, place, and time. He appears well-developed and well-nourished. No distress.  HENT:  Mouth/Throat: Oropharynx is clear and moist.  Eyes: Conjunctivae are normal. No scleral icterus.  Neck: Neck supple. No JVD present. No tracheal deviation present.  Cardiovascular: Normal  rate, regular rhythm, normal heart sounds and intact distal pulses.  Exam reveals no gallop and no friction rub.   No murmur heard. Pulmonary/Chest: Effort normal. No accessory muscle usage. No respiratory distress. He has wheezes.  rhonchi  Abdominal: Soft. He exhibits no distension. There is no tenderness.  Musculoskeletal: Normal range of motion. He exhibits no edema or tenderness.  Neurological: He is alert and oriented to person,  place, and time.  Skin: Skin is warm and dry. He is not diaphoretic.  Psychiatric: He has a normal mood and affect.  Nursing note and vitals reviewed.   ED Course  Procedures (including critical care time)   Dg Chest 2 View  05/16/2015  CLINICAL DATA:  Cough, shortness of breath and congestion over the past 3 days, worsening. EXAM: CHEST  2 VIEW COMPARISON:  PA and lateral chest 09/17/2013 and 02/26/2015. FINDINGS: The lungs are clear. Heart size is normal. No pneumothorax or pleural effusion. No focal bony abnormality. IMPRESSION: Negative chest. Electronically Signed   By: Inge Rise M.D.   On: 05/16/2015 09:35     I have personally reviewed and evaluated these images as part of my medical decision-making.   MDM   Albuterol and atrovent neb.  pred po.  Cxr.  Reviewed nursing notes and prior charts for additional history.   Recheck persistent wheezing. Improved air exchange.  Alb and atrovent neb.  Recheck breathing comfortably, no increased wob.  Hx copd, prod cough, congestion on exam, will rx.     Lajean Saver, MD 05/16/15 1043

## 2015-05-16 NOTE — ED Notes (Signed)
Pt reports hx COPD, productive cough yellow sputum since last Wednesday, cough and SOB increased x3 days. Denies pain. Able to speak in full sentences.

## 2015-05-16 NOTE — Discharge Instructions (Signed)
It was our pleasure to provide your ER care today - we hope that you feel better.  Take prednisone as prescribed.  Use albuterol inhaler as need.   Avoid any smoking, or being in a smoky environment.  Take zithromax (antibiotic) as prescribed.  Follow up with primary care doctor in the next few days for recheck if symptoms fail to improve/resolve.  Your blood pressure is high today.  Limit salt intake.  Follow up with primary care doctor for recheck of blood pressure in 1 week.  Return to ER if worse, new symptoms, chest pain, increased difficulty breathing, other concern.     Chronic Obstructive Pulmonary Disease Chronic obstructive pulmonary disease (COPD) is a common lung condition in which airflow from the lungs is limited. COPD is a general term that can be used to describe many different lung problems that limit airflow, including both chronic bronchitis and emphysema. If you have COPD, your lung function will probably never return to normal, but there are measures you can take to improve lung function and make yourself feel better. CAUSES   Smoking (common).  Exposure to secondhand smoke.  Genetic problems.  Chronic inflammatory lung diseases or recurrent infections. SYMPTOMS  Shortness of breath, especially with physical activity.  Deep, persistent (chronic) cough with a large amount of thick mucus.  Wheezing.  Rapid breaths (tachypnea).  Gray or bluish discoloration (cyanosis) of the skin, especially in your fingers, toes, or lips.  Fatigue.  Weight loss.  Frequent infections or episodes when breathing symptoms become much worse (exacerbations).  Chest tightness. DIAGNOSIS Your health care provider will take a medical history and perform a physical examination to diagnose COPD. Additional tests for COPD may include:  Lung (pulmonary) function tests.  Chest X-ray.  CT scan.  Blood tests. TREATMENT  Treatment for COPD may include:  Inhaler and  nebulizer medicines. These help manage the symptoms of COPD and make your breathing more comfortable.  Supplemental oxygen. Supplemental oxygen is only helpful if you have a low oxygen level in your blood.  Exercise and physical activity. These are beneficial for nearly all people with COPD.  Lung surgery or transplant.  Nutrition therapy to gain weight, if you are underweight.  Pulmonary rehabilitation. This may involve working with a team of health care providers and specialists, such as respiratory, occupational, and physical therapists. HOME CARE INSTRUCTIONS  Take all medicines (inhaled or pills) as directed by your health care provider.  Avoid over-the-counter medicines or cough syrups that dry up your airway (such as antihistamines) and slow down the elimination of secretions unless instructed otherwise by your health care provider.  If you are a smoker, the most important thing that you can do is stop smoking. Continuing to smoke will cause further lung damage and breathing trouble. Ask your health care provider for help with quitting smoking. He or she can direct you to community resources or hospitals that provide support.  Avoid exposure to irritants such as smoke, chemicals, and fumes that aggravate your breathing.  Use oxygen therapy and pulmonary rehabilitation if directed by your health care provider. If you require home oxygen therapy, ask your health care provider whether you should purchase a pulse oximeter to measure your oxygen level at home.  Avoid contact with individuals who have a contagious illness.  Avoid extreme temperature and humidity changes.  Eat healthy foods. Eating smaller, more frequent meals and resting before meals may help you maintain your strength.  Stay active, but balance activity with periods of  rest. Exercise and physical activity will help you maintain your ability to do things you want to do.  Preventing infection and hospitalization is  very important when you have COPD. Make sure to receive all the vaccines your health care provider recommends, especially the pneumococcal and influenza vaccines. Ask your health care provider whether you need a pneumonia vaccine.  Learn and use relaxation techniques to manage stress.  Learn and use controlled breathing techniques as directed by your health care provider. Controlled breathing techniques include:  Pursed lip breathing. Start by breathing in (inhaling) through your nose for 1 second. Then, purse your lips as if you were going to whistle and breathe out (exhale) through the pursed lips for 2 seconds.  Diaphragmatic breathing. Start by putting one hand on your abdomen just above your waist. Inhale slowly through your nose. The hand on your abdomen should move out. Then purse your lips and exhale slowly. You should be able to feel the hand on your abdomen moving in as you exhale.  Learn and use controlled coughing to clear mucus from your lungs. Controlled coughing is a series of short, progressive coughs. The steps of controlled coughing are:  Lean your head slightly forward.  Breathe in deeply using diaphragmatic breathing.  Try to hold your breath for 3 seconds.  Keep your mouth slightly open while coughing twice.  Spit any mucus out into a tissue.  Rest and repeat the steps once or twice as needed. SEEK MEDICAL CARE IF:  You are coughing up more mucus than usual.  There is a change in the color or thickness of your mucus.  Your breathing is more labored than usual.  Your breathing is faster than usual. SEEK IMMEDIATE MEDICAL CARE IF:  You have shortness of breath while you are resting.  You have shortness of breath that prevents you from:  Being able to talk.  Performing your usual physical activities.  You have chest pain lasting longer than 5 minutes.  Your skin color is more cyanotic than usual.  You measure low oxygen saturations for longer than 5  minutes with a pulse oximeter. MAKE SURE YOU:  Understand these instructions.  Will watch your condition.  Will get help right away if you are not doing well or get worse.   This information is not intended to replace advice given to you by your health care provider. Make sure you discuss any questions you have with your health care provider.   Document Released: 01/30/2005 Document Revised: 05/13/2014 Document Reviewed: 12/17/2012 Elsevier Interactive Patient Education 2016 Else[employer] Inc[employer]    Cough, Adult Coughing is a reflex that clears your throat and your airways. Coughing helps to heal and protect your lungs. It is normal to cough occasionally, but a cough that happens with other symptoms or lasts a long time may be a sign of a condition that needs treatment. A cough may last only 2-3 weeks (acute), or it may last longer than 8 weeks (chronic). CAUSES Coughing is commonly caused by:  Breathing in substances that irritate your lungs.  A viral or bacterial respiratory infection.  Allergies.  Asthma.  Postnasal drip.  Smoking.  Acid backing up from the stomach into the esophagus (gastroesophageal reflux).  Certain medicines.  Chronic lung problems, including COPD (or rarely, lung cancer).  Other medical conditions such as heart failure. HOME CARE INSTRUCTIONS  Pay attention to any changes in your symptoms. Take these actions to help with your discomfort:  Take medicines only as told by  your health care provider.  If you were prescribed an antibiotic medicine, take it as told by your health care provider. Do not stop taking the antibiotic even if you start to feel better.  Talk with your health care provider before you take a cough suppressant medicine.  Drink enough fluid to keep your urine clear or pale yellow.  If the air is dry, use a cold steam vaporizer or humidifier in your bedroom or your home to help loosen secretions.  Avoid anything that causes you to  cough at work or at home.  If your cough is worse at night, try sleeping in a semi-upright position.  Avoid cigarette smoke. If you smoke, quit smoking. If you need help quitting, ask your health care provider.  Avoid caffeine.  Avoid alcohol.  Rest as needed. SEEK MEDICAL CARE IF:   You have new symptoms.  You cough up pus.  Your cough does not get better after 2-3 weeks, or your cough gets worse.  You cannot control your cough with suppressant medicines and you are losing sleep.  You develop pain that is getting worse or pain that is not controlled with pain medicines.  You have a fever.  You have unexplained weight loss.  You have night sweats. SEEK IMMEDIATE MEDICAL CARE IF:  You cough up blood.  You have difficulty breathing.  Your heartbeat is very fast.   This information is not intended to replace advice given to you by your health care provider. Make sure you discuss any questions you have with your health care provider.   Document Released: 10/19/2010 Document Revised: 01/11/2015 Document Reviewed: 06/29/2014 Elsevier Interactive Patient Education 2016 [employer].    Smoking Hazards Smoking cigarettes is extremely bad for your health. Tobacco smoke has over 200 known poisons in it. It contains the poisonous gases nitrogen oxide and carbon monoxide. There are over 60 chemicals in tobacco smoke that cause cancer. Some of the chemicals found in cigarette smoke include:   Cyanide.   Benzene.   Formaldehyde.   Methanol (wood alcohol).   Acetylene (fuel used in welding torches).   Ammonia.  Even smoking lightly shortens your life expectancy by several years. You can greatly reduce the risk of medical problems for you and your family by stopping now. Smoking is the most preventable cause of death and disease in our society. Within days of quitting smoking, your circulation improves, you decrease the risk of having a heart attack, and your lung  capacity improves. There may be some increased phlegm in the first few days after quitting, and it may take months for your lungs to clear up completely. Quitting for 10 years reduces your risk of developing lung cancer to almost that of a nonsmoker.  WHAT ARE THE RISKS OF SMOKING? Cigarette smokers have an increased risk of many serious medical problems, including:  Lung cancer.   Lung disease (such as pneumonia, bronchitis, and emphysema).   Heart attack and chest pain due to the heart not getting enough oxygen (angina).   Heart disease and peripheral blood vessel disease.   Hypertension.   Stroke.   Oral cancer (cancer of the lip, mouth, or voice box).   Bladder cancer.   Pancreatic cancer.   Cervical cancer.   Pregnancy complications, including premature birth.   Stillbirths and smaller newborn babies, birth defects, and genetic damage to sperm.   Early menopause.   Lower estrogen level for women.   Infertility.   Facial wrinkles.   Blindness.  Increased risk of broken bones (fractures).   Senile dementia.   Stomach ulcers and internal bleeding.   Delayed wound healing and increased risk of complications during surgery. Because of secondhand smoke exposure, children of smokers have an increased risk of the following:   Sudden infant death syndrome (SIDS).   Respiratory infections.   Lung cancer.   Heart disease.   Ear infections.  WHY IS SMOKING ADDICTIVE? Nicotine is the chemical agent in tobacco that is capable of causing addiction or dependence. When you smoke and inhale, nicotine is absorbed rapidly into the bloodstream through your lungs. Both inhaled and noninhaled nicotine may be addictive.  WHAT ARE THE BENEFITS OF QUITTING?  There are many health benefits to quitting smoking. Some are:   The likelihood of developing cancer and heart disease decreases. Health improvements are seen almost immediately.   Blood  pressure, pulse rate, and breathing patterns start returning to normal soon after quitting.   People who quit may see an improvement in their overall quality of life.  HOW DO YOU QUIT SMOKING? Smoking is an addiction with both physical and psychological effects, and longtime habits can be hard to change. Your health care provider can recommend:  Programs and community resources, which may include group support, education, or therapy.  Replacement products, such as patches, gum, and nasal sprays. Use these products only as directed. Do not replace cigarette smoking with electronic cigarettes (commonly called e-cigarettes). The safety of e-cigarettes is unknown, and some may contain harmful chemicals. FOR MORE INFORMATION  American Lung Association: www.lung.org  American Cancer Society: www.cancer.org   This information is not intended to replace advice given to you by your health care provider. Make sure you discuss any questions you have with your health care provider.   Document Released: 05/30/2004 Document Revised: 02/10/2013 Document Reviewed: 10/12/2012 Elsevier Interactive Patient Education 2016 [employer].    Hypertension Hypertension, commonly called high blood pressure, is when the force of blood pumping through your arteries is too strong. Your arteries are the blood vessels that carry blood from your heart throughout your body. A blood pressure reading consists of a higher number over a lower number, such as 110/72. The higher number (systolic) is the pressure inside your arteries when your heart pumps. The lower number (diastolic) is the pressure inside your arteries when your heart relaxes. Ideally you want your blood pressure below 120/80. Hypertension forces your heart to work harder to pump blood. Your arteries may become narrow or stiff. Having untreated or uncontrolled hypertension can cause heart attack, stroke, kidney disease, and other problems. RISK FACTORS Some  risk factors for high blood pressure are controllable. Others are not.  Risk factors you cannot control include:  Race. You may be at higher risk if you are African American. Age. Risk increases with age. Gender. Men are at higher risk than women before age 56 years. After age 51, women are at higher risk than men. Risk factors you can control include: Not getting enough exercise or physical activity. Being overweight. Getting too much fat, sugar, calories, or salt in your diet. Drinking too much alcohol. SIGNS AND SYMPTOMS Hypertension does not usually cause signs or symptoms. Extremely high blood pressure (hypertensive crisis) may cause headache, anxiety, shortness of breath, and nosebleed. DIAGNOSIS To check if you have hypertension, your health care provider will measure your blood pressure while you are seated, with your arm held at the level of your heart. It should be measured at least twice using the  same arm. Certain conditions can cause a difference in blood pressure between your right and left arms. A blood pressure reading that is higher than normal on one occasion does not mean that you need treatment. If it is not clear whether you have high blood pressure, you may be asked to return on a different day to have your blood pressure checked again. Or, you may be asked to monitor your blood pressure at home for 1 or more weeks. TREATMENT Treating high blood pressure includes making lifestyle changes and possibly taking medicine. Living a healthy lifestyle can help lower high blood pressure. You may need to change some of your habits. Lifestyle changes may include: Following the DASH diet. This diet is high in fruits, vegetables, and whole grains. It is low in salt, red meat, and added sugars. Keep your sodium intake below 2,300 mg per day. Getting at least 30-45 minutes of aerobic exercise at least 4 times per week. Losing weight if necessary. Not smoking. Limiting alcoholic  beverages. Learning ways to reduce stress. Your health care provider may prescribe medicine if lifestyle changes are not enough to get your blood pressure under control, and if one of the following is true: You are 21-89 years of age and your systolic blood pressure is above 140. You are 39 years of age or older, and your systolic blood pressure is above 150. Your diastolic blood pressure is above 90. You have diabetes, and your systolic blood pressure is over [id number] or your diastolic blood pressure is over 90. You have kidney disease and your blood pressure is above 140/90. You have heart disease and your blood pressure is above 140/90. Your personal target blood pressure may vary depending on your medical conditions, your age, and other factors. HOME CARE INSTRUCTIONS Have your blood pressure rechecked as directed by your health care provider.  Take medicines only as directed by your health care provider. Follow the directions carefully. Blood pressure medicines must be taken as prescribed. The medicine does not work as well when you skip doses. Skipping doses also puts you at risk for problems. Do not smoke.  Monitor your blood pressure at home as directed by your health care provider. SEEK MEDICAL CARE IF:  You think you are having a reaction to medicines taken. You have recurrent headaches or feel dizzy. You have swelling in your ankles. You have trouble with your vision. SEEK IMMEDIATE MEDICAL CARE IF: You develop a severe headache or confusion. You have unusual weakness, numbness, or feel faint. You have severe chest or abdominal pain. You vomit repeatedly. You have trouble breathing. MAKE SURE YOU:  Understand these instructions. Will watch your condition. Will get help right away if you are not doing well or get worse.   This information is not intended to replace advice given to you by your health care provider. Make sure you discuss any questions you have with your health  care provider.   Document Released: 04/22/2005 Document Revised: 09/06/2014 Document Reviewed: 02/12/2013 Elsevier Interactive Patient Education [employer].

## 2015-06-20 ENCOUNTER — Emergency Department (HOSPITAL_COMMUNITY)
Admission: EM | Admit: 2015-06-20 | Discharge: 2015-06-20 | Disposition: A | Discharge status: Home / Self Care | Payer: Medicaid Other | Attending: Emergency Medicine | Admitting: Emergency Medicine

## 2015-06-20 ENCOUNTER — Encounter (HOSPITAL_COMMUNITY): Payer: Self-pay | Admitting: Emergency Medicine

## 2015-06-20 ENCOUNTER — Emergency Department (HOSPITAL_COMMUNITY): Payer: Medicaid Other

## 2015-06-20 DIAGNOSIS — F17210 Nicotine dependence, cigarettes, uncomplicated: Secondary | ICD-10-CM | POA: Insufficient documentation

## 2015-06-20 DIAGNOSIS — Z79899 Other long term (current) drug therapy: Secondary | ICD-10-CM | POA: Insufficient documentation

## 2015-06-20 DIAGNOSIS — R05 Cough: Secondary | ICD-10-CM | POA: Diagnosis present

## 2015-06-20 DIAGNOSIS — J441 Chronic obstructive pulmonary disease with (acute) exacerbation: Secondary | ICD-10-CM | POA: Insufficient documentation

## 2015-06-20 DIAGNOSIS — I10 Essential (primary) hypertension: Secondary | ICD-10-CM | POA: Diagnosis not present

## 2015-06-20 MED ORDER — MOMETASONE FURO-FORMOTEROL FUM 200-5 MCG/ACT IN AERO: 2.0000 | INHALATION_SPRAY | Freq: Two times a day (BID) | RESPIRATORY_TRACT | Status: DC

## 2015-06-20 MED ORDER — ALBUTEROL SULFATE HFA 108 (90 BASE) MCG/ACT IN AERS: 1.0000 | INHALATION_SPRAY | Freq: Four times a day (QID) | RESPIRATORY_TRACT | Status: DC | PRN

## 2015-06-20 MED ORDER — TIOTROPIUM BROMIDE MONOHYDRATE 18 MCG IN CAPS: 18.0000 ug | ORAL_CAPSULE | Freq: Two times a day (BID) | RESPIRATORY_TRACT | Status: DC

## 2015-06-20 MED ORDER — ALBUTEROL SULFATE (2.5 MG/3ML) 0.083% IN NEBU: 2.5000 mg | INHALATION_SOLUTION | Freq: Once | RESPIRATORY_TRACT | Status: DC

## 2015-06-20 MED ORDER — ALBUTEROL SULFATE (2.5 MG/3ML) 0.083% IN NEBU
5.0000 mg | INHALATION_SOLUTION | Freq: Once | RESPIRATORY_TRACT | Status: AC
  Administered 2015-06-20: 5 mg via RESPIRATORY_TRACT
  Filled 2015-06-20: qty 6

## 2015-06-20 MED ORDER — PREDNISONE 20 MG PO TABS: 20.0000 mg | ORAL_TABLET | Freq: Two times a day (BID) | ORAL | Status: DC

## 2015-06-20 MED ORDER — GUAIFENESIN ER 600 MG PO TB12: 600.0000 mg | ORAL_TABLET | Freq: Two times a day (BID) | ORAL | Status: DC

## 2015-06-20 MED ORDER — PREDNISONE 20 MG PO TABS
60.0000 mg | ORAL_TABLET | Freq: Once | ORAL | Status: AC
  Administered 2015-06-20: 60 mg via ORAL
  Filled 2015-06-20: qty 3

## 2015-06-20 NOTE — ED Provider Notes (Signed)
CSN: [id number]     Arrival date & time 06/20/15  0809 History   First MD Initiated Contact with Patient 06/20/15 1119     Chief Complaint  Patient presents with  . Cough      HPI  Pt presents with a cogh for the last 2 weeks.  No fever,  No chest pain.  No edema, pnd, or orthopnea.  No plus pack year smoking history. Diagnosis COPD out of his medications. States he went to [place] to visit for a month and did not have "my Medicaid available in [place]". Thus has been out of his medications.  Past Medical History  Diagnosis Date  . COPD (chronic obstructive pulmonary disease) ([place])   . HTN (hypertension)    Past Surgical History  Procedure Laterality Date  . Hemorrhoid surgery     Family History  Problem Relation Age of Onset  . Cancer      Aunt   Social History  Substance Use Topics  . Smoking status: Current Some Day Smoker -- 0.75 packs/day for 40 years    Types: Cigarettes  . Smokeless tobacco: Former Systems developer    Quit date: 07/21/2012     Comment: "take a puff every once in a while"  . Alcohol Use: 0.0 oz/week    0 Standard drinks or equivalent per week     Comment: Social    Review of Systems  Constitutional: Negative for fever, chills, diaphoresis, appetite change and fatigue.  HENT: Negative for mouth sores, sore throat and trouble swallowing.   Eyes: Negative for visual disturbance.  Respiratory: Positive for cough, shortness of breath and wheezing. Negative for chest tightness.   Cardiovascular: Negative for chest pain.  Gastrointestinal: Negative for nausea, vomiting, abdominal pain, diarrhea and abdominal distention.  Endocrine: Negative for polydipsia, polyphagia and polyuria.  Genitourinary: Negative for dysuria, frequency and hematuria.  Musculoskeletal: Negative for gait problem.  Skin: Negative for color change, pallor and rash.  Neurological: Negative for dizziness, syncope, light-headedness and headaches.  Hematological: Does not bruise/bleed easily.   Psychiatric/Behavioral: Negative for behavioral problems and confusion.      Allergies  Review of patient's allergies indicates no known allergies.  Home Medications   Prior to Admission medications   Medication Sig Start Date End Date Taking? Authorizing Provider  ALPRAZolam Duanne Moron) 1 MG tablet Take 0.5-1 tablets (0.5-1 mg total) by mouth 3 (three) times daily as needed for anxiety. 04/13/15  Yes Elberta Leatherwood, MD  aspirin-sod bicarb-citric acid (ALKA-SELTZER) 325 MG TBEF tablet Take 325 mg by mouth every 6 (six) hours as needed (cold symptoms).   Yes Historical Provider, MD  ibuprofen (ADVIL,MOTRIN) 200 MG tablet Take 200-400 mg by mouth every 6 (six) hours as needed for headache or moderate pain.   Yes Historical Provider, MD  selenium sulfide (SELSUN) 2.5 % shampoo Apply 1 application topically daily as needed for irritation. 09/07/14  Yes Rigoberto Noel, MD  albuterol (PROVENTIL HFA;VENTOLIN HFA) 108 (90 Base) MCG/ACT inhaler Inhale 1-2 puffs into the lungs every 6 (six) hours as needed for wheezing. 06/20/15   Tanna Furry, MD  azithromycin (ZITHROMAX Z-PAK) 250 MG tablet Take as directed 05/16/15   Lajean Saver, MD  citalopram (CELEXA) 20 MG tablet Take one tablet (20mg ) every day for the first 7 days. Then take 2 tablets (40mg ) every day after that. 04/13/15   Elberta Leatherwood, MD  guaiFENesin (MUCINEX) 600 MG 12 hr tablet Take 1 tablet (600 mg total) by mouth 2 (two)  times daily. 06/20/15   Tanna Furry, MD  mometasone-formoterol (DULERA) 200-5 MCG/ACT AERO Inhale 2 puffs into the lungs 2 (two) times daily. 06/20/15   Tanna Furry, MD  predniSONE (DELTASONE) 20 MG tablet Take 1 tablet (20 mg total) by mouth 2 (two) times daily with a meal. 06/20/15   Tanna Furry, MD  tiotropium (SPIRIVA HANDIHALER) 18 MCG inhalation capsule Place 1 capsule (18 mcg total) into inhaler and inhale 2 (two) times daily. 06/20/15   Tanna Furry, MD   BP 156/90 mmHg  Pulse 68  Temp(Src) 98.5 F (36.9 C) (Oral)  Resp 18  Ht  5\' 5"  (1.651 m)  Wt 135 lb (61.236 kg)  BMI 22.47 kg/m2  SpO2 100% Physical Exam  Constitutional: He is oriented to person, place, and time. He appears well-developed and well-nourished. No distress.  HENT:  Head: Normocephalic.  Eyes: Conjunctivae are normal. Pupils are equal, round, and reactive to light. No scleral icterus.  Neck: Normal range of motion. Neck supple. No thyromegaly present.  Cardiovascular: Normal rate and regular rhythm.  Exam reveals no gallop and no friction rub.   No murmur heard. Pulmonary/Chest: Effort normal. No respiratory distress. He has wheezes. He has no rales.  Abdominal: Soft. Bowel sounds are normal. He exhibits no distension. There is no tenderness. There is no rebound.  Musculoskeletal: Normal range of motion.  Neurological: He is alert and oriented to person, place, and time.  Skin: Skin is warm and dry. No rash noted.  Psychiatric: He has a normal mood and affect. His behavior is normal.    ED Course  Procedures (including critical care time) Labs Review Labs Reviewed - No data to display  Imaging Review Dg Chest 2 View  06/20/2015  CLINICAL DATA:  Sick for approximately 1 month. Shortness of breath. History of COPD. EXAM: CHEST  2 VIEW COMPARISON:  05/16/2015 FINDINGS: There is hyperinflation with flattening of the hemidiaphragms. Findings are suggestive for COPD or emphysema. Heart and mediastinum are within normal limits. There are ECG skin stickers overlying the left lower chest. Lungs are clear without airspace disease or pulmonary edema. No acute bone abnormality. No large pleural effusions. IMPRESSION: No acute cardiopulmonary disease. Hyperinflation of the lungs. Findings compatible with history of COPD. Electronically Signed   By: Markus Daft M.D.   On: 06/20/2015 08:57   I have personally reviewed and evaluated these images and lab results as part of my medical decision-making.   EKG Interpretation None      MDM   Final diagnoses:   COPD exacerbation ([place])    As well. Remains well oxygenated at 99% on room air. No indications for additional testing at this time. Unchanged EKG. Chest x-ray without effusions, CHF or infiltrate. Plan is home treatment for COPD exacerbation prednisone, doxycycline, and inhalers.    Tanna Furry, MD 06/20/15 [phone]

## 2015-06-20 NOTE — ED Notes (Signed)
Pt w/ hx of COPD c/o productive cough and orthopnea x 1 month.  States he was dx with bronchitis in [place] but his medicaid would not fill an rx there because it was not in [place].

## 2015-06-20 NOTE — ED Notes (Signed)
EKG PERFORMED IN TRIAGE 

## 2015-06-20 NOTE — ED Notes (Signed)
RT called

## 2015-06-20 NOTE — Discharge Instructions (Signed)

## 2015-07-10 ENCOUNTER — Ambulatory Visit (INDEPENDENT_AMBULATORY_CARE_PROVIDER_SITE_OTHER): Payer: Medicaid Other | Admitting: Family Medicine

## 2015-07-10 ENCOUNTER — Telehealth: Payer: Self-pay | Admitting: Family Medicine

## 2015-07-10 ENCOUNTER — Encounter: Payer: Self-pay | Admitting: Family Medicine

## 2015-07-10 VITALS — BP 140/90 | HR 88 | Temp 98.0°F | Ht 65.0 in | Wt 137.0 lb

## 2015-07-10 DIAGNOSIS — J432 Centrilobular emphysema: Secondary | ICD-10-CM

## 2015-07-10 DIAGNOSIS — F411 Generalized anxiety disorder: Secondary | ICD-10-CM

## 2015-07-10 MED ORDER — PREDNISONE 20 MG PO TABS: 20.0000 mg | ORAL_TABLET | Freq: Two times a day (BID) | ORAL | Status: DC

## 2015-07-10 MED ORDER — ALPRAZOLAM 1 MG PO TABS: 0.5000 mg | ORAL_TABLET | Freq: Three times a day (TID) | ORAL | Status: DC | PRN

## 2015-07-10 MED ORDER — TIOTROPIUM BROMIDE MONOHYDRATE 18 MCG IN CAPS: 18.0000 ug | ORAL_CAPSULE | Freq: Two times a day (BID) | RESPIRATORY_TRACT | Status: DC

## 2015-07-10 MED ORDER — AZITHROMYCIN 250 MG PO TABS: ORAL_TABLET | ORAL | Status: DC

## 2015-07-10 NOTE — Assessment & Plan Note (Addendum)
Patient presents today with signs and symptoms consistent with COPD exacerbation. Patient was recently treated with short course of prednisone without antibiotics. Unfortunately, since that time he has had no sputum production and some worsening in symptoms. Currently denies any prior fevers. - Prednisone 40 mg 5 days - Azithromycin 5 days

## 2015-07-10 NOTE — Progress Notes (Signed)
HPI  CC: SOB/sputum production x1-2 weeks. Starting to get worse. Endorses occasional wheezing and cough. He denies any fevers, chills, nausea, vomiting, diarrhea. He states that he had initially gotten better when he was provided prednisone from a prior ED visit. Unfortunately, after his prednisone course he gradually started to feel worse. He endorses good compliance with his inhaler medications. He denies any peripheral edema or pillow orthopnea. No chest pain.  High-risk medication use: Patient has been on daily Xanax for over a year. I was able to discuss with him my desire to wean him off of this medication and to begin him on a safer controller medication for his anxiety. At our last visit I had started him on Celexa. He states that he only took this medication for 1 week before discontinuing it due to the medication "not doing anything" (even though we had discussed the need to take this for at least 4 weeks before any effect would be noted). Patient was not amendable to this change (stating: "I know people who have been on this medication for 20 years") but was also not adamantly against it either (stating: "You're the doctor"). Unfortunately, patient was unable to stay long enough to complete this conversation as he had to pick up his granddaughter who is sick at school. I informed him that at our next visit we would complete this conversation and that I would begin weaning him off of Xanax regardless of his decision to initiate a safer alternative to this medication.  Review of Systems   See HPI for ROS. All other systems reviewed and are negative.  CC, SH/smoking status, and VS noted  Objective: BP 140/90 mmHg  Pulse 88  Temp(Src) 98 F (36.7 C) (Oral)  Ht 5\' 5"  (1.651 m)  Wt 137 lb (62.143 kg)  BMI 22.80 kg/m2  SpO2 99% Gen: NAD, alert, cooperative, normal affect. CV: RRR, no murmur Resp: CTAB, occasional wheezes, prolonged expiratory phase, non-labored Ext: No edema,  warm Neuro: Alert and oriented, Speech clear, No gross deficits  Assessment and plan:  COPD (chronic obstructive pulmonary disease) Patient presents today with signs and symptoms consistent with COPD exacerbation. Patient was recently treated with short course of prednisone without antibiotics. Unfortunately, since that time he has had no sputum production and some worsening in symptoms. Currently denies any prior fevers. - Prednisone 40 mg 5 days - Azithromycin 5 days  Anxiety state Patient endorses continued anxiety which is currently well controlled with short acting Xanax. I do not feel comfortable providing patient with chronic Xanax use without a controller on board for his anxiety. I have initiated the discussion to begin weaning him off of regular Xanax use and to initiate SSRIs. At our last visit I had initiated Celexa. According to the patient, he only took this for one week because it "didn't do anything". We had a discussion that that medication requires a longer amount of time before any effect can take place. I had reminded him that we had had this discussion when I first prescribed this medication. Unfortunately, our visit was cut short due to his granddaughter needing to be picked up from school because she was feeling sick. - I will begin the weaning process from Xanax at our next visit.  - This is to be done regardless of patients desire to begin SSRI therapy. - Patient will need a UDS at the next visit. - I've encouraged that he begin taking the Celexa again however I do not feels though he  will do this at this time. - I provided him 2 months of Xanax and asked for him to follow-up in 2 months.    Meds ordered this encounter  Medications  . predniSONE (DELTASONE) 20 MG tablet    Sig: Take 1 tablet (20 mg total) by mouth 2 (two) times daily with a meal.    Dispense:  10 tablet    Refill:  0  . azithromycin (ZITHROMAX) 250 MG tablet    Sig: Take 2 tablets today. Take 1  tablet every day after that.    Dispense:  6 each    Refill:  0  . DISCONTD: ALPRAZolam (XANAX) 1 MG tablet    Sig: Take 0.5-1 tablets (0.5-1 mg total) by mouth 3 (three) times daily as needed for anxiety.    Dispense:  90 tablet    Refill:  0    Do not fill until 08/12/15  . ALPRAZolam (XANAX) 1 MG tablet    Sig: Take 0.5-1 tablets (0.5-1 mg total) by mouth 3 (three) times daily as needed for anxiety.    Dispense:  90 tablet    Refill:  0    Do not fill until 07/12/15  . tiotropium (SPIRIVA HANDIHALER) 18 MCG inhalation capsule    Sig: Place 1 capsule (18 mcg total) into inhaler and inhale 2 (two) times daily.    Dispense:  60 capsule    Refill:  [place], MD,MS,  PGY2 07/10/2015 6:01 PM

## 2015-07-10 NOTE — Assessment & Plan Note (Signed)
Patient endorses continued anxiety which is currently well controlled with short acting Xanax. I do not feel comfortable providing patient with chronic Xanax use without a controller on board for his anxiety. I have initiated the discussion to begin weaning him off of regular Xanax use and to initiate SSRIs. At our last visit I had initiated Celexa. According to the patient, he only took this for one week because it "didn't do anything". We had a discussion that that medication requires a longer amount of time before any effect can take place. I had reminded him that we had had this discussion when I first prescribed this medication. Unfortunately, our visit was cut short due to his granddaughter needing to be picked up from school because she was feeling sick. - I will begin the weaning process from Xanax at our next visit.  - This is to be done regardless of patients desire to begin SSRI therapy. - Patient will need a UDS at the next visit. - I've encouraged that he begin taking the Celexa again however I do not feels though he will do this at this time. - I provided him 2 months of Xanax and asked for him to follow-up in 2 months.

## 2015-07-10 NOTE — Patient Instructions (Signed)
It was a pleasure seeing you today in our clinic. Today we discussed your breathing and medications. Here is the treatment plan we have discussed and agreed upon together:   - Take the medications prescribed as they were prescribed. - I would like to see you back in 2 months. At that time we will further discuss how we are going to manage your anxiety long-term.

## 2015-07-10 NOTE — Telephone Encounter (Signed)
I received a form in my mailbox for patient to receive PCS care at home. These services are for those having difficulty performing normal ADLs on their own. I was unable to reach patient after hours, but I will need to have further clarification of what patient requires help with at home before I complete this form, as he appears highly functional during our office visits.  Will attempt another call in near future.

## 2015-08-01 ENCOUNTER — Telehealth: Payer: Self-pay | Admitting: Family Medicine

## 2015-08-01 NOTE — Telephone Encounter (Signed)
Did you receive any forms on this patient?

## 2015-08-01 NOTE — Telephone Encounter (Signed)
Need to have faxed back documents sent on 2/14 for form for Medical Necessity on patient.  If provider did not receive please contact Gavin Kane at number left for confirmation.

## 2015-08-02 NOTE — Telephone Encounter (Signed)
Unless this form was sent this week I have not received this.  Was this for disability? If so, I don't believe I would be able to appropriately fill this out for him. If patient is seeking disability he will need a PT evaluation or evaluation through Department of Human Services to assess his level of disability.   If this is not for disability then I'd like to know the specific purpose for this form before I am to fill it out.

## 2015-08-03 NOTE — Telephone Encounter (Signed)
Form re-faxed and placed in MD box (forms were for personal care services).

## 2015-08-04 ENCOUNTER — Telehealth: Payer: Self-pay | Admitting: Family Medicine

## 2015-08-04 NOTE — Telephone Encounter (Signed)
Pleas see new telephone note

## 2015-08-04 NOTE — Telephone Encounter (Signed)
I reviewed the paperwork which was placed in my box earlier this week. Patient is attempting to receive "personal care services" which appear to be services which aided in individuals unable to perform activities of daily living.   To the best of my knowledge patient has no medical condition which should keep him from performing any ADLs. Patient will need to make a appointment to be seen in our office to discuss the need for this type of service.  At that visit I will assess whether or not I feel comfortable completing this paperwork or referring patient to physical therapy to be further assessed in his capacity.  In Short: - Patient will need to make a appointment in our clinic to discuss this paperwork. - Paperwork will be in my inbox until his next visit.

## 2015-08-07 NOTE — Telephone Encounter (Signed)
Tried calling patient to inform, line just rings with no answer. Will try to call again later.

## 2015-08-08 NOTE — Telephone Encounter (Signed)
Tried calling again, no answer, no voicemail.

## 2015-09-12 ENCOUNTER — Encounter: Payer: Self-pay | Admitting: Family Medicine

## 2015-09-12 ENCOUNTER — Ambulatory Visit (INDEPENDENT_AMBULATORY_CARE_PROVIDER_SITE_OTHER): Payer: Medicaid Other | Admitting: Family Medicine

## 2015-09-12 VITALS — BP 157/105 | HR 73 | Temp 98.7°F | Ht 65.0 in | Wt 138.0 lb

## 2015-09-12 DIAGNOSIS — Z125 Encounter for screening for malignant neoplasm of prostate: Secondary | ICD-10-CM | POA: Diagnosis not present

## 2015-09-12 DIAGNOSIS — I10 Essential (primary) hypertension: Secondary | ICD-10-CM

## 2015-09-12 DIAGNOSIS — Z139 Encounter for screening, unspecified: Secondary | ICD-10-CM | POA: Diagnosis not present

## 2015-09-12 DIAGNOSIS — F411 Generalized anxiety disorder: Secondary | ICD-10-CM

## 2015-09-12 DIAGNOSIS — J432 Centrilobular emphysema: Secondary | ICD-10-CM

## 2015-09-12 DIAGNOSIS — Z1159 Encounter for screening for other viral diseases: Secondary | ICD-10-CM | POA: Diagnosis not present

## 2015-09-12 DIAGNOSIS — Z79899 Other long term (current) drug therapy: Secondary | ICD-10-CM

## 2015-09-12 MED ORDER — GUAIFENESIN ER 600 MG PO TB12: 600.0000 mg | ORAL_TABLET | Freq: Two times a day (BID) | ORAL | Status: DC

## 2015-09-12 MED ORDER — ALPRAZOLAM 0.5 MG PO TABS: 0.5000 mg | ORAL_TABLET | Freq: Two times a day (BID) | ORAL | Status: DC | PRN

## 2015-09-12 MED ORDER — LISINOPRIL 5 MG PO TABS: 5.0000 mg | ORAL_TABLET | Freq: Every day | ORAL | Status: DC

## 2015-09-12 NOTE — Assessment & Plan Note (Signed)
Stable: Patient was satting well today in the clinic. No issues at this time with shortness of breath or wheeze. His goal exam yielded clear breath sounds bilaterally. - Continue Dulera and Spiriva.

## 2015-09-12 NOTE — Patient Instructions (Signed)
It was a pleasure seeing you today in our clinic. Today we discussed your anxiety and overall health. Here is the treatment plan we have discussed and agreed upon together:   - As stated before we are going to begin decreasing your overall sound dosing. This month I've decreased your Xanax tablet doses or 1 mg to 0.5 mg. Next month's prescription I have decreased your total quantity from 90 tablets to 60 tablets. I would like to have a follow-up with me in 2 months or sooner if you're having any significant issues. - I would like for you to restart taking the Celexa that I had previously prescribed you. This should help with your anxiety as you begin to take less of the short acting Xanax. - I will send you the lab results that we obtained today through the mail unless there are any issues that we have to discuss sooner in which I will call you.

## 2015-09-12 NOTE — Progress Notes (Signed)
Gavin Lynch, MD, MS Phone: [phone]  Subjective:  CC -- Annual Physical;  With complaints of some SOB. No cough. No mucous.  No CP, blurred vision, or HA. Long distance walks forces him to stop to catch his breath.   Cardiovascular: - Risk as of 09/13/2015: N/a (obtaining lipid panel today) - Dx Hypertension: yes  - Dx Hyperlipidemia: unknown; checking today - Dx Obesity: no  - Physical Activity: no  - Diabetes: no   Cancer: Colorectal >> Colonoscopy: no, information provided today.  Lung >> Tobacco Use: yes, 1 pack per week (14 pack/year history)  - If so, previous Low-Dose CT screen: no  Prostate >> Interested in DRE and/or PSA: yes, DRE today and PSA today.  - Denied any weak stream, urinary hesitancy, urinary frequency, dysuria, or gross hematuria. Skin >> Suspicious lesions: no   Social: Alcohol Use: no, some but <1 drink a week  Tobacco Use: yes   - Interested in Quitting: no, some interest but not now  Other Drugs: no  Risky Sexual Behavior: no   Depression: no   - PHQ9 score:  Support and Life at Home: yes   Other: Osteoporosis: no Zoster Vaccine: no, "I think we'll wait" Flu Vaccine: no  Pneumonia Vaccine: no   ROS- denies vision changes, dizziness, fatigue, fevers, chills, dysphagia, shortness of breath, chest pain, abdominal pain, nausea, vomiting, diarrhea, weakness, numbness, or paresthesia.  Past Medical History Patient Active Problem List   Diagnosis Date Noted  . Screening for prostate cancer 09/13/2015  . Tinea versicolor 09/07/2014  . Anxiety state 05/18/2014  . Chronic fatigue 12/01/2012  . Tobacco abuse counseling 11/27/2012  . COPD (chronic obstructive pulmonary disease) ([place]) 10/13/2012  . Nonspecific ST-T changes 10/11/2012  . Hypertension 10/11/2012    Medications- reviewed and updated Current Outpatient Prescriptions  Medication Sig Dispense Refill  . albuterol (PROVENTIL HFA;VENTOLIN HFA) 108 (90 Base) MCG/ACT inhaler Inhale 1-2  puffs into the lungs every 6 (six) hours as needed for wheezing. 1 Inhaler 0  . ALPRAZolam (XANAX) 0.5 MG tablet Take 1 tablet (0.5 mg total) by mouth 2 (two) times daily as needed for anxiety. 90 tablet 0  . ALPRAZolam (XANAX) 0.5 MG tablet Take 1 tablet (0.5 mg total) by mouth 2 (two) times daily as needed for anxiety. 60 tablet 0  . aspirin-sod bicarb-citric acid (ALKA-SELTZER) 325 MG TBEF tablet Take 325 mg by mouth every 6 (six) hours as needed (cold symptoms).    . citalopram (CELEXA) 20 MG tablet Take one tablet (20mg ) every day for the first 7 days. Then take 2 tablets (40mg ) every day after that. 60 tablet 5  . guaiFENesin (MUCINEX) 600 MG 12 hr tablet Take 1 tablet (600 mg total) by mouth 2 (two) times daily. 30 tablet 1  . ibuprofen (ADVIL,MOTRIN) 200 MG tablet Take 200-400 mg by mouth every 6 (six) hours as needed for headache or moderate pain.    Marland Kitchen lisinopril (PRINIVIL,ZESTRIL) 5 MG tablet Take 1 tablet (5 mg total) by mouth daily. 90 tablet 3  . mometasone-formoterol (DULERA) 200-5 MCG/ACT AERO Inhale 2 puffs into the lungs 2 (two) times daily. 1 Inhaler 1  . selenium sulfide (SELSUN) 2.5 % shampoo Apply 1 application topically daily as needed for irritation. 118 mL 12  . tiotropium (SPIRIVA HANDIHALER) 18 MCG inhalation capsule Place 1 capsule (18 mcg total) into inhaler and inhale 2 (two) times daily. 60 capsule 12   No current facility-administered medications for this visit.    Objective: BP 157/105  mmHg  Pulse 73  Temp(Src) 98.7 F (37.1 C) (Oral)  Ht 5\' 5"  (1.651 m)  Wt 138 lb (62.596 kg)  BMI 22.96 kg/m2  SpO2 96% Gen: NAD, alert, cooperative with exam HEENT: NCAT, EOMI, PERRL CV: RRR, good S1/S2, no murmur Resp: CTABL, no wheezes, non-labored Abd: Soft, Non Tender, Non Distended, BS present, no guarding or organomegaly Genital Exam: Prostate examination: symmetrical, enlarged: equally and firm: bilateral Ext: No edema, warm Neuro: Alert and oriented, No gross  deficits   Assessment/Plan:  COPD (chronic obstructive pulmonary disease) Stable: Patient was satting well today in the clinic. No issues at this time with shortness of breath or wheeze. His goal exam yielded clear breath sounds bilaterally. - Continue Dulera and Spiriva.  Anxiety state Stable: Patient's anxiety appears to be fairly stable as he had no complaints today. He continues to have a high amount of use of 1 mg Xanax. We had a long discussion about how I do not feel long-term benzodiazepine use is appropriate and my plans to wean him off of this medicine. Patient was displeased with this direction of care. Prior to our meeting I had called his pharmacy to see how many refills he had left of his SSRI which had been prescribed to him prior to this year. Patient had only filled the original prescription and had not obtained any refills at this time I discussed this with him and restated my desire to have him on this medication rather than Xanax, or nothing at all. Patient stated his understanding. - I've decreased patient's family prescription from 1 mg 90 tablets monthly, to 0.5 mg 90 tablets this month, and 0.5 mg 60 tablets starting next month. My plan will be to slow this taper as we reach lower total monthly doses.  Hypertension New: Patient's blood pressure is elevated again today. He has had an elevated blood pressure in the hypertensive range now for 3 straight visits. I believe it is important at this time to initiate antihypertensive therapy. - Lisinopril 5 mg daily. - I've asked patient to come back for a lab visit in 1-2 weeks to recheck a BMP.  Screening for prostate cancer We discussed the pros and cons of screening for prostate cancer this time. Patient considered this choice and ultimately decided to have his prostate checked. DRE yielded a slightly enlarged and relatively firm prostate without any irregularities. - PSA obtained today.    Orders Placed This  Encounter  Procedures  . Lipid panel  . BASIC METABOLIC PANEL WITH GFR  . Hepatitis C antibody  . PSA  . BASIC METABOLIC PANEL WITH GFR    Standing Status: Future     Number of Occurrences:      Standing Expiration Date: 09/11/2016    Meds ordered this encounter  Medications  . ALPRAZolam (XANAX) 0.5 MG tablet    Sig: Take 1 tablet (0.5 mg total) by mouth 2 (two) times daily as needed for anxiety.    Dispense:  90 tablet    Refill:  0    Do not fill until 09/12/15  . ALPRAZolam (XANAX) 0.5 MG tablet    Sig: Take 1 tablet (0.5 mg total) by mouth 2 (two) times daily as needed for anxiety.    Dispense:  60 tablet    Refill:  0    Do not fill until 10/13/15  . DISCONTD: guaiFENesin (MUCINEX) 600 MG 12 hr tablet    Sig: Take 1 tablet (600 mg total) by mouth 2 (two) times  daily.    Dispense:  30 tablet    Refill:  1  . lisinopril (PRINIVIL,ZESTRIL) 5 MG tablet    Sig: Take 1 tablet (5 mg total) by mouth daily.    Dispense:  90 tablet    Refill:  3  . guaiFENesin (MUCINEX) 600 MG 12 hr tablet    Sig: Take 1 tablet (600 mg total) by mouth 2 (two) times daily.    Dispense:  30 tablet    Refill:  1     Elberta Leatherwood, MD,MS,  PGY2 09/13/2015 12:04 AM

## 2015-09-13 DIAGNOSIS — Z125 Encounter for screening for malignant neoplasm of prostate: Secondary | ICD-10-CM | POA: Insufficient documentation

## 2015-09-13 LAB — HEPATITIS C ANTIBODY: HCV AB: NEGATIVE

## 2015-09-13 LAB — BASIC METABOLIC PANEL WITH GFR
BUN: 15 mg/dL (ref 7–25)
CALCIUM: 9.5 mg/dL (ref 8.6–10.3)
CO2: 26 mmol/L (ref 20–31)
Chloride: 104 mmol/L (ref 98–110)
Creat: 1.11 mg/dL (ref 0.70–1.25)
GFR, EST AFRICAN AMERICAN: 82 mL/min (ref >=60)
GFR, EST NON AFRICAN AMERICAN: 71 mL/min (ref >=60)
Glucose, Bld: 82 mg/dL (ref 65–99)
Potassium: 4 mmol/L (ref 3.5–5.3)
SODIUM: 141 mmol/L (ref 135–146)

## 2015-09-13 LAB — LIPID PANEL
CHOL/HDL RATIO: 3.1 ratio (ref <=5.0)
Cholesterol: 160 mg/dL (ref 125–200)
HDL: 52 mg/dL (ref >=40)
LDL Cholesterol: 92 mg/dL (ref <130)
Triglycerides: 81 mg/dL (ref <150)
VLDL: 16 mg/dL (ref <30)

## 2015-09-13 LAB — PSA: PSA: 1.32 ng/mL (ref <=4.00)

## 2015-09-13 NOTE — Assessment & Plan Note (Signed)
New: Patient's blood pressure is elevated again today. He has had an elevated blood pressure in the hypertensive range now for 3 straight visits. I believe it is important at this time to initiate antihypertensive therapy. - Lisinopril 5 mg daily. - I've asked patient to come back for a lab visit in 1-2 weeks to recheck a BMP.

## 2015-09-13 NOTE — Assessment & Plan Note (Signed)
Stable: Patient's anxiety appears to be fairly stable as he had no complaints today. He continues to have a high amount of use of 1 mg Xanax. We had a long discussion about how I do not feel long-term benzodiazepine use is appropriate and my plans to wean him off of this medicine. Patient was displeased with this direction of care. Prior to our meeting I had called his pharmacy to see how many refills he had left of his SSRI which had been prescribed to him prior to this year. Patient had only filled the original prescription and had not obtained any refills at this time I discussed this with him and restated my desire to have him on this medication rather than Xanax, or nothing at all. Patient stated his understanding. - I've decreased patient's family prescription from 1 mg 90 tablets monthly, to 0.5 mg 90 tablets this month, and 0.5 mg 60 tablets starting next month. My plan will be to slow this taper as we reach lower total monthly doses.

## 2015-09-13 NOTE — Assessment & Plan Note (Signed)
We discussed the pros and cons of screening for prostate cancer this time. Patient considered this choice and ultimately decided to have his prostate checked. DRE yielded a slightly enlarged and relatively firm prostate without any irregularities. - PSA obtained today.

## 2015-10-11 ENCOUNTER — Ambulatory Visit (INDEPENDENT_AMBULATORY_CARE_PROVIDER_SITE_OTHER): Payer: Medicaid Other | Admitting: Family Medicine

## 2015-10-11 VITALS — BP 139/82 | HR 79 | Temp 98.7°F | Wt 139.6 lb

## 2015-10-11 DIAGNOSIS — Z79899 Other long term (current) drug therapy: Secondary | ICD-10-CM | POA: Diagnosis not present

## 2015-10-11 DIAGNOSIS — F17200 Nicotine dependence, unspecified, uncomplicated: Secondary | ICD-10-CM | POA: Insufficient documentation

## 2015-10-11 DIAGNOSIS — J432 Centrilobular emphysema: Secondary | ICD-10-CM | POA: Diagnosis not present

## 2015-10-11 DIAGNOSIS — I10 Essential (primary) hypertension: Secondary | ICD-10-CM

## 2015-10-11 LAB — BASIC METABOLIC PANEL WITH GFR
BUN: 12 mg/dL (ref 7–25)
CALCIUM: 9.4 mg/dL (ref 8.6–10.3)
CHLORIDE: 106 mmol/L (ref 98–110)
CO2: 29 mmol/L (ref 20–31)
CREATININE: 0.97 mg/dL (ref 0.70–1.25)
GFR, EST NON AFRICAN AMERICAN: 84 mL/min (ref >=60)
GFR, Est African American: >89 mL/min (ref >=60)
GLUCOSE: 81 mg/dL (ref 65–99)
POTASSIUM: 4.3 mmol/L (ref 3.5–5.3)
Sodium: 141 mmol/L (ref 135–146)

## 2015-10-11 MED ORDER — NICOTINE 7 MG/24HR TD PT24: 7.0000 mg | MEDICATED_PATCH | Freq: Every day | TRANSDERMAL | Status: DC

## 2015-10-11 MED ORDER — NICOTINE POLACRILEX 4 MG MT LOZG: 4.0000 mg | LOZENGE | OROMUCOSAL | Status: DC | PRN

## 2015-10-11 NOTE — Assessment & Plan Note (Signed)
Improved: Patient's blood pressure is improved since the previous visit. He states he is doing well on his new medication and has no concerns at this time. - Repeat BMP to assess for hyperkalemia or AKI.

## 2015-10-11 NOTE — Assessment & Plan Note (Signed)
Stable: Patient denies any respiratory issues at this time. He endorses interest in smoking cessation at this time and is asking about medications that may help him quit. - Nicotine patch provided. - Negative Lozenge provided.  Next: The patient does not have significant improvement with nicotine replacement done would strongly consider additional medication to help with cravings.

## 2015-10-11 NOTE — Patient Instructions (Signed)
It was a pleasure seeing you today in our clinic. Today we discussed quitting smoking, your anxiety, and recent labs. Here is the treatment plan we have discussed and agreed upon together:   - I'm very pleased with how all of your lab results have looked. - I've prescribed you a nicotine patch and lozenge. Place the patch on the area of your skin where it can stay undisturbed for 24 hours. When replacing this patch use a different area of skin. You may go back to the original area of skin used every 4 days. (So try to have 4 areas in which you rotate).  - Use the lozenge for any cravings. - I'm very pleased with how your blood pressure looks today. We will draw labs today to ensure that your body is tolerating this appropriately. - I would like to see you back in about 2 months.

## 2015-10-11 NOTE — Progress Notes (Signed)
   HPI  CC: Follow-up on labs and blood pressure Patient is here to follow-up on his labs which were taken at the previous visit. He states he has been feeling well since our last visit and has been very compliant with his medication at this time. He was relieved to know that all of his labs seem to be normal.  He endorses good compliance with his antihypertensive medication at this time. He denies any known side effects and feels comfortable with how he is tolerating it. He is very pleased to know that it is having a good effect on his blood pressure and he states that he is willing to continue therapy.  ROS: Patient denies any fever, chills, diaphoresis, dizziness, lightheadedness, confusion, shortness of breath, cough, chest pain, abdominal pain, nausea, vomiting, diarrhea, or rash.  CC, SH/smoking status, and VS noted  Objective: BP 139/82 mmHg  Pulse 79  Temp(Src) 98.7 F (37.1 C) (Oral)  Wt 139 lb 9.6 oz (63.322 kg) Gen: NAD, alert, cooperative, and pleasant. CV: RRR, no murmur Resp: CTAB, no wheezes, non-labored Ext: No edema, warm   Assessment and plan:  Hypertension Improved: Patient's blood pressure is improved since the previous visit. He states he is doing well on his new medication and has no concerns at this time. - Repeat BMP to assess for hyperkalemia or AKI.  COPD (chronic obstructive pulmonary disease) Stable: Patient denies any respiratory issues at this time. He endorses interest in smoking cessation at this time and is asking about medications that may help him quit. - Nicotine patch provided. - Negative Lozenge provided.  Next: The patient does not have significant improvement with nicotine replacement done would strongly consider additional medication to help with cravings.   Meds ordered this encounter  Medications  . nicotine (NICODERM CQ - DOSED IN MG/24 HR) 7 mg/24hr patch    Sig: Place 1 patch (7 mg total) onto the skin daily.    Dispense:  28  patch    Refill:  0  . nicotine polacrilex (COMMIT) 4 MG lozenge    Sig: Take 1 lozenge (4 mg total) by mouth as needed for smoking cessation.    Dispense:  100 tablet    Refill:  0     Elberta Leatherwood, MD,MS,  PGY2 10/11/2015 7:36 PM

## 2015-12-15 ENCOUNTER — Other Ambulatory Visit: Payer: Self-pay | Admitting: Family Medicine

## 2015-12-25 ENCOUNTER — Other Ambulatory Visit: Payer: Self-pay | Admitting: *Deleted

## 2015-12-25 NOTE — Telephone Encounter (Signed)
Pt is calling because the lower dose of his Xanax is not working at all. He would like another prescription called in at his previous dose. jw

## 2015-12-25 NOTE — Telephone Encounter (Signed)
I apologize. Per my last note to myself I should clarify: I will not be INCREASING patient's Xanax dose. However, as agreed by patient and I--I can write for one additional month's refill at this time.   If this is ok with patient it will be waiting for pickup tomorrow after noon.

## 2015-12-25 NOTE — Telephone Encounter (Signed)
Patient is currently on a taper to get off of this medication. No new prescription will be written at this time. Getting his body use to this lower dose is going to be uncomfortable.  Is patient taking his Celexa?   I would be happy to see patient in the clinic for this issue.

## 2015-12-26 ENCOUNTER — Other Ambulatory Visit: Payer: Self-pay | Admitting: Family Medicine

## 2015-12-26 MED ORDER — ALPRAZOLAM 0.5 MG PO TABS: 0.5000 mg | ORAL_TABLET | Freq: Two times a day (BID) | ORAL | 0 refills | Status: DC | PRN

## 2015-12-28 ENCOUNTER — Encounter: Payer: Self-pay | Admitting: Family Medicine

## 2015-12-28 ENCOUNTER — Ambulatory Visit (INDEPENDENT_AMBULATORY_CARE_PROVIDER_SITE_OTHER): Payer: Medicaid Other | Admitting: Family Medicine

## 2015-12-28 VITALS — BP 122/92 | HR 72 | Temp 98.3°F | Ht 65.0 in | Wt 129.0 lb

## 2015-12-28 DIAGNOSIS — F1310 Sedative, hypnotic or anxiolytic abuse, uncomplicated: Secondary | ICD-10-CM | POA: Diagnosis not present

## 2015-12-28 DIAGNOSIS — R0609 Other forms of dyspnea: Secondary | ICD-10-CM | POA: Diagnosis not present

## 2015-12-28 MED ORDER — MOMETASONE FURO-FORMOTEROL FUM 200-5 MCG/ACT IN AERO: 2.0000 | INHALATION_SPRAY | Freq: Two times a day (BID) | RESPIRATORY_TRACT | 3 refills | Status: DC

## 2015-12-28 NOTE — Progress Notes (Signed)
HPI  CC: Dyspnea on exertion and anxiety Patient is here to discuss recent onset of dyspnea on exertion. He states that the symptoms have gotten worse over the past 3 months. He was recently visiting family in [place] when he experienced significant dyspnea while walking from his brother's house to his cousin's house. He denies any gross activity at this time but states that he was very winded and had to stop to catch his breath. He denies any chest pain. He states that these symptoms seem to be intermittent and does not have any idea what could be bringing on these symptoms. He denies any wheezing. No recent fevers, chills, nausea, vomiting, lightheadedness, dizziness, heart palpitations, numbness, paresthesias, or weakness.  Patient is also here to discuss his anxiety. Over the better part of the year patient and I have been in discussion about the appropriate treatment for his anxiety. As discussed at previous visits I had initiated a wean off of his Xanax towards a long-acting SSRI. While discussing patient's medications patient failed to report taking this SSRI. Upon inquiry patient admitted that he had not been taking this SSRI and that he would like to be back on his previous dose of Xanax. Patient and I had a long discussion about the appropriate and inappropriate use of Xanax. I informed him that long-term use of this medication was not something I was comfortable continuing. As discussed during previous visits it was my impression that our plan was to wean him off of Xanax and get him started on this SSRI. Patient states that the SSRI "does not work". When asked how long he had been taking it he was unable to give me a finite amount of time but stated that he had not taken it for very long. At that time I reminded him of our previous discussions that SSRIs take multiple weeks (4-6) abuse before any effect it is noticeable. Patient stated his understanding of this but insisted that this SSRI was  not working for him. He endorses worsening anxiety. He endorses some irritability and hand tremors. No diaphoresis, SI or HI.  Review of Systems   See HPI for ROS. All other systems reviewed and are negative.  CC, SH/smoking status, and VS noted  Objective: BP (!) 122/92 (BP Location: Right Arm, Patient Position: Sitting, Cuff Size: Normal)   Pulse 72   Temp 98.3 F (36.8 C) (Oral)   Ht 5\' 5"  (1.651 m)   Wt 129 lb (58.5 kg)   BMI 21.47 kg/m  Gen: NAD, alert CV: RRR, no murmur, no peripheral edema appreciated Resp: CTAB, no wheezes, non-labored Neuro: Alert and oriented, Speech clear, No gross deficits  Assessment and plan:  DOE (dyspnea on exertion) Patient is endorsing some new onset dyspnea on exertion. He denies any wheezing at the time of this dyspnea. No new sputum production. He denies any chest pain during these episodes. Compliance with all hypertensive and respiratory medication. There is a chance that patient's dyspnea could be secondary to anxiety related symptoms, however patient stated that he did not think this is the case. - Echocardiogram ordered.  Next: If echocardiogram is benign then patient will likely need to be seen by his old pulmonologist.  Benzodiazepine abuse Patient admitted to taking "5-6 of these things at a time" while provider has been actively weaning him off of this medication. Currently prescribed 1 tablet (0.5 mg) twice a day. Informed patient that I would continue to wean him down on this medication. - Continue  wean - Encouraged proper compliance with medication regimen - Continue to encourage taking Celexa (patient admitted to not taking this medication)  Next: Strongly considering referring to psych.  [Discussed patient with Dr. Gwendlyn Deutscher   Orders Placed This Encounter  Procedures  . ECHOCARDIOGRAM COMPLETE    Standing Status:   Future    Standing Expiration Date:   03/29/2017    Order Specific Question:   Where should this test be  performed    Answer:   [facility])    Order Specific Question:   Does the patient weigh less than or greater than 250 lbs?    Answer:   Patient weighs less than 250 lbs    Order Specific Question:   Complete or Limited study?    Answer:   Complete    Order Specific Question:   Does the patient have a known history of hypersensitivity to Perflutren (aka Scientist, research (medical) for echocardiograms - CHECK ALLERGIES)    Answer:   No    Order Specific Question:   ADMINISTER PERFLUTERN    Answer:   ADMINISTER PERFLUTREN    Order Specific Question:   Expected Date:    Answer:   1 month    Order Specific Question:   Reason for exam-Echo    Answer:   Dyspnea  786.09 / R06.00    Meds ordered this encounter  Medications  . mometasone-formoterol (DULERA) 200-5 MCG/ACT AERO    Sig: Inhale 2 puffs into the lungs 2 (two) times daily.    Dispense:  1 Inhaler    Refill:  3     Elberta Leatherwood, MD,MS,  PGY3 12/28/2015 6:40 PM

## 2015-12-28 NOTE — Assessment & Plan Note (Signed)
Patient is endorsing some new onset dyspnea on exertion. He denies any wheezing at the time of this dyspnea. No new sputum production. He denies any chest pain during these episodes. Compliance with all hypertensive and respiratory medication. There is a chance that patient's dyspnea could be secondary to anxiety related symptoms, however patient stated that he did not think this is the case. - Echocardiogram ordered.  Next: If echocardiogram is benign then patient will likely need to be seen by his old pulmonologist.

## 2015-12-28 NOTE — Assessment & Plan Note (Addendum)
Patient admitted to taking "5-6 of these things at a time" while provider has been actively weaning him off of this medication. Currently prescribed 1 tablet (0.5 mg) twice a day. Informed patient that I would continue to wean him down on this medication. - Continue wean - Encouraged proper compliance with medication regimen - Continue to encourage taking Celexa (patient admitted to not taking this medication)  Next: Strongly considering referring to psych.  [Discussed patient with Dr. Gwendlyn Deutscher

## 2015-12-28 NOTE — Patient Instructions (Addendum)
It was a pleasure seeing you today in our clinic. Today we discussed your anxiety and shortness of breath. Here is the treatment plan we have discussed and agreed upon together:   - I've ordered an echocardiogram to be obtained. My nursing staff will give you the details of this appointment before you leave. - I would strongly encourage you to begin taking your Celexa. Take this medication as prescribed. This medication typically takes approximately 6 weeks to build up in your system and show any effect. - We will continue to taper off your Xanax. Make sure to pick up the prescription that was left up front for you earlier this week. It is important to take this prescription as prescribed in order to allow this prescription to last as long as desired.  - For your information: The next prescription will likely involve a lesser number of Xanax tablets provided.

## 2016-01-10 ENCOUNTER — Other Ambulatory Visit: Payer: Self-pay

## 2016-01-10 ENCOUNTER — Ambulatory Visit (HOSPITAL_COMMUNITY): Payer: Medicaid Other | Attending: Internal Medicine

## 2016-01-10 DIAGNOSIS — R0609 Other forms of dyspnea: Secondary | ICD-10-CM | POA: Diagnosis not present

## 2016-01-10 DIAGNOSIS — Z720 Tobacco use: Secondary | ICD-10-CM | POA: Diagnosis not present

## 2016-01-10 DIAGNOSIS — I119 Hypertensive heart disease without heart failure: Secondary | ICD-10-CM | POA: Diagnosis not present

## 2016-01-10 DIAGNOSIS — J449 Chronic obstructive pulmonary disease, unspecified: Secondary | ICD-10-CM | POA: Insufficient documentation

## 2016-01-10 DIAGNOSIS — R0600 Dyspnea, unspecified: Secondary | ICD-10-CM | POA: Diagnosis present

## 2016-02-08 ENCOUNTER — Other Ambulatory Visit: Payer: Self-pay | Admitting: Family Medicine

## 2016-02-08 MED ORDER — ALPRAZOLAM 0.5 MG PO TABS: 0.5000 mg | ORAL_TABLET | Freq: Two times a day (BID) | ORAL | 0 refills | Status: DC | PRN

## 2016-02-08 MED ORDER — ALBUTEROL SULFATE HFA 108 (90 BASE) MCG/ACT IN AERS: 1.0000 | INHALATION_SPRAY | Freq: Four times a day (QID) | RESPIRATORY_TRACT | 3 refills | Status: DC | PRN

## 2016-02-08 MED ORDER — TIOTROPIUM BROMIDE MONOHYDRATE 18 MCG IN CAPS: 18.0000 ug | ORAL_CAPSULE | Freq: Every day | RESPIRATORY_TRACT | 5 refills | Status: DC

## 2016-02-08 NOTE — Telephone Encounter (Signed)
Refills ordered and ready for pickup.

## 2016-02-08 NOTE — Telephone Encounter (Signed)
Pt needs a refills on albuterol and spiriva. Pt also needs a refill on xanax and would like to be called when it is ready to be picked up. Please advise. Thanks! ep

## 2016-04-16 ENCOUNTER — Other Ambulatory Visit: Payer: Self-pay | Admitting: *Deleted

## 2016-04-16 NOTE — Telephone Encounter (Signed)
Pt is calling requesting a refill on all his medications.  Advised he would likely need an appt first, pt is agreeable and Appt is made for Friday.   He request a refill before appt as he is completely out. I advised I would send MD a request and he may refill all medications except for Xanex as he would need to be seen in clinic for this refill.  Pt is reluctant but agreeable. Fleeger, Salome Spotted, CMA

## 2016-04-17 MED ORDER — MOMETASONE FURO-FORMOTEROL FUM 200-5 MCG/ACT IN AERO: 2.0000 | INHALATION_SPRAY | Freq: Two times a day (BID) | RESPIRATORY_TRACT | 3 refills | Status: DC

## 2016-04-17 MED ORDER — CITALOPRAM HYDROBROMIDE 20 MG PO TABS: ORAL_TABLET | ORAL | 5 refills | Status: DC

## 2016-04-17 MED ORDER — ALBUTEROL SULFATE HFA 108 (90 BASE) MCG/ACT IN AERS: 1.0000 | INHALATION_SPRAY | Freq: Four times a day (QID) | RESPIRATORY_TRACT | 3 refills | Status: DC | PRN

## 2016-04-17 MED ORDER — TIOTROPIUM BROMIDE MONOHYDRATE 18 MCG IN CAPS: 18.0000 ug | ORAL_CAPSULE | Freq: Every day | RESPIRATORY_TRACT | 5 refills | Status: DC

## 2016-04-17 MED ORDER — LISINOPRIL 5 MG PO TABS: 5.0000 mg | ORAL_TABLET | Freq: Every day | ORAL | 3 refills | Status: DC

## 2016-04-19 ENCOUNTER — Ambulatory Visit (INDEPENDENT_AMBULATORY_CARE_PROVIDER_SITE_OTHER): Payer: Medicaid Other | Admitting: Family Medicine

## 2016-04-19 DIAGNOSIS — F411 Generalized anxiety disorder: Secondary | ICD-10-CM | POA: Diagnosis not present

## 2016-04-19 DIAGNOSIS — Z9114 Patient's other noncompliance with medication regimen: Secondary | ICD-10-CM | POA: Diagnosis present

## 2016-04-19 DIAGNOSIS — Z91148 Patient's other noncompliance with medication regimen for other reason: Secondary | ICD-10-CM

## 2016-04-19 DIAGNOSIS — J432 Centrilobular emphysema: Secondary | ICD-10-CM | POA: Diagnosis not present

## 2016-04-19 MED ORDER — VENLAFAXINE HCL ER 75 MG PO CP24: 75.0000 mg | ORAL_CAPSULE | Freq: Every day | ORAL | 5 refills | Status: DC

## 2016-04-19 MED ORDER — GUAIFENESIN ER 600 MG PO TB12: 600.0000 mg | ORAL_TABLET | Freq: Two times a day (BID) | ORAL | 5 refills | Status: DC | PRN

## 2016-04-19 MED ORDER — ALPRAZOLAM 0.5 MG PO TABS
0.5000 mg | ORAL_TABLET | Freq: Two times a day (BID) | ORAL | 0 refills | Status: DC | PRN
Start: 2016-04-19 — End: 2016-08-05

## 2016-04-19 NOTE — Assessment & Plan Note (Signed)
Patient still having some shortness of breath with exertion. Echocardiogram showed no significant changes.  - I've asked patient to set up an appointment with Dr. Valentina Lucks for pulmonary function test.

## 2016-04-19 NOTE — Progress Notes (Signed)
HPI  CC: Anxiety and depression Patient is here for med refills on his anxiety medication. He states that he has been not very well controlled with his anxiety and he is adamant that he wants more Xanax per month. Patient is unable to fully articulate exactly what his symptoms of anxiety are other than the fact that he is always "on edge". He endorses the ability to perform typical daily activities. He denies any SI/HI. No fever, chills, weight loss, or weight gain. He endorses some shortness of breath. He states that he is compliant with all his medications.  The majority of our time today were spent discussing Xanax and why he feels this medication should be provided more generously. When discussing my feeling that Xanax isn't inappropriate baseline "controller" medication for anxiety for long-term care patient regularly states that he "knows how [he] feels" and he knows "how [his] body feels", but is unable to describe much further than that. When we discuss the prescribed Celexa patient does not know this medication by name (brand or generic name) and does not know what this is for. He initially states that he does not know if he has been taking it only to then states that he has been compliant with this medication.  Patient also states that he has not been taking the prescribed Xanax as prescribed. He states that he regularly takes "a lot" over a short period of time only to run out early in the month, leaving him without any Xanax for the rest of the month. I informed him that we have discussed that he will not receive any additional Xanax during the month and he should try to space dosing out to last him all month. Patient denies any seizure-like episodes.   Review of Systems    See HPI for ROS. All other systems reviewed and are negative.  CC, SH/smoking status, and VS noted  Objective: BP 130/80 (BP Location: Left Arm, Patient Position: Sitting, Cuff Size: Normal)   Pulse 85   Temp  97.7 F (36.5 C) (Oral)   Wt 138 lb 3.2 oz (62.7 kg)   SpO2 98%   BMI 23.00 kg/m  Gen: NAD, alert, cooperative CV: RRR, no murmur Resp: CTAB, no wheezes, non-labored Ext: No edema, warm Neuro: Alert and oriented, Speech clear, No gross deficits  Assessment and plan:  Anxiety state Worsening: Patient is here for medication refills and anxiety. Patient is adamant that he needs his Xanax for anxiety. I've been slowly weaning him off of this for multiple months now. No seizure activity. I feels outpatient has not been compliant with his Celexa prescription. I stressed to him the importance of taking this medication every day. Although he initially stated he had not think he had been taking his Celexa he then stated that he had been. Because of this I will consider this a treatment failure with Celexa and transition to a new medication. - Discontinue Celexa - Initiate Effexor - Refills and next 40 tabs (previous prescription for 45 tabs)  Next: Patient follow-up in one month to assess Effexor efficacy. No Xanax refills will be provided without appointment (this was discussed with patient and he acknowledged).  COPD (chronic obstructive pulmonary disease) Patient still having some shortness of breath with exertion. Echocardiogram showed no significant changes.  - I've asked patient to set up an appointment with Dr. Valentina Lucks for pulmonary function test.   Meds ordered this encounter  Medications  . guaiFENesin (MUCINEX) 600 MG 12 hr tablet  Sig: Take 1 tablet (600 mg total) by mouth 2 (two) times daily as needed.    Dispense:  30 tablet    Refill:  5  . ALPRAZolam (XANAX) 0.5 MG tablet    Sig: Take 1 tablet (0.5 mg total) by mouth 2 (two) times daily as needed for anxiety.    Dispense:  40 tablet    Refill:  0  . venlafaxine XR (EFFEXOR-XR) 75 MG 24 hr capsule    Sig: Take 1 capsule (75 mg total) by mouth daily with breakfast.    Dispense:  30 capsule    Refill:  5     Elberta Leatherwood, MD,MS,  PGY3 04/19/2016 6:39 PM

## 2016-04-19 NOTE — Patient Instructions (Addendum)
It was a pleasure seeing you today in our clinic. Today we discussed your anxiety and medications. Here is the treatment plan we have discussed and agreed upon together:   - As we have in previous office visits I am continuing to wean down your overalls and a prescription. I provided you 40 tablets today, down from 45 tablets last month. Make sure this prescription is able to last you a full 30 days and schedule an appointment with me next month. (Because no Xanax can be prescribed without an appointment.  - I've started you on Effexor. Take this tablet once a day every day. - Please STOP taking your Celexa (citalopram).

## 2016-04-19 NOTE — Assessment & Plan Note (Addendum)
Worsening: Patient is here for medication refills and anxiety. Patient is adamant that he needs his Xanax for anxiety. I've been slowly weaning him off of this for multiple months now. No seizure activity. I feels outpatient has not been compliant with his Celexa prescription. I stressed to him the importance of taking this medication every day. Although he initially stated he had not think he had been taking his Celexa he then stated that he had been. Because of this I will consider this a treatment failure with Celexa and transition to a new medication. - Discontinue Celexa - Initiate Effexor - Refills and next 40 tabs (previous prescription for 45 tabs)  Next: Patient follow-up in one month to assess Effexor efficacy. No Xanax refills will be provided without appointment (this was discussed with patient and he acknowledged).

## 2016-04-23 ENCOUNTER — Telehealth: Payer: Self-pay

## 2016-04-23 MED ORDER — LISINOPRIL 5 MG PO TABS: 5.0000 mg | ORAL_TABLET | Freq: Every day | ORAL | 3 refills | Status: DC

## 2016-04-23 NOTE — Telephone Encounter (Signed)
Lisinopril was sent on 04/17/2016 to pharmacy. Pt was told pharmacy did not receive the Rx. Can you resend this Rx? Ottis Stain, CMA

## 2016-04-23 NOTE — Telephone Encounter (Signed)
Medication resent to patient's pharmacy. Please inform the patient.  Derl Barrow, RN

## 2016-04-25 NOTE — Telephone Encounter (Signed)
Pt informed. Gavin Kane, CMA  

## 2016-05-14 ENCOUNTER — Emergency Department (HOSPITAL_COMMUNITY)
Admission: EM | Admit: 2016-05-14 | Discharge: 2016-05-15 | Disposition: A | Discharge status: Home / Self Care | Payer: Medicaid Other | Attending: Emergency Medicine | Admitting: Emergency Medicine

## 2016-05-14 ENCOUNTER — Encounter (HOSPITAL_COMMUNITY): Payer: Self-pay | Admitting: Emergency Medicine

## 2016-05-14 ENCOUNTER — Emergency Department (HOSPITAL_COMMUNITY): Payer: Medicaid Other

## 2016-05-14 DIAGNOSIS — F17210 Nicotine dependence, cigarettes, uncomplicated: Secondary | ICD-10-CM | POA: Insufficient documentation

## 2016-05-14 DIAGNOSIS — Z79899 Other long term (current) drug therapy: Secondary | ICD-10-CM | POA: Insufficient documentation

## 2016-05-14 DIAGNOSIS — R0602 Shortness of breath: Secondary | ICD-10-CM | POA: Diagnosis present

## 2016-05-14 DIAGNOSIS — J441 Chronic obstructive pulmonary disease with (acute) exacerbation: Secondary | ICD-10-CM | POA: Insufficient documentation

## 2016-05-14 DIAGNOSIS — I10 Essential (primary) hypertension: Secondary | ICD-10-CM | POA: Diagnosis not present

## 2016-05-14 MED ORDER — IPRATROPIUM-ALBUTEROL 0.5-2.5 (3) MG/3ML IN SOLN
3.0000 mL | Freq: Once | RESPIRATORY_TRACT | Status: AC
  Administered 2016-05-14: 3 mL via RESPIRATORY_TRACT
  Filled 2016-05-14: qty 3

## 2016-05-14 MED ORDER — GUAIFENESIN ER 600 MG PO TB12
600.0000 mg | ORAL_TABLET | Freq: Two times a day (BID) | ORAL | Status: DC
  Administered 2016-05-15: 600 mg via ORAL
  Filled 2016-05-14: qty 1

## 2016-05-14 MED ORDER — AZITHROMYCIN 250 MG PO TABS
500.0000 mg | ORAL_TABLET | Freq: Once | ORAL | Status: AC
  Administered 2016-05-15: 500 mg via ORAL
  Filled 2016-05-14: qty 2

## 2016-05-14 MED ORDER — PREDNISONE 20 MG PO TABS
40.0000 mg | ORAL_TABLET | Freq: Once | ORAL | Status: AC
  Administered 2016-05-15: 40 mg via ORAL
  Filled 2016-05-14: qty 2

## 2016-05-14 NOTE — ED Triage Notes (Addendum)
Pt reports SOB and productive cough for the past 5 days. Hx of COPD. No CP, dizziness or light headedness. No obvious wheezing or dyspnea in triage.

## 2016-05-14 NOTE — ED Provider Notes (Signed)
[facility] DEPT Provider Note   CSN: [id number] Arrival date & time: 05/14/16  1713     History   Chief Complaint Chief Complaint  Patient presents with  . Shortness of Breath    HPI Gavin Kane is a 61 y.o. male.   Shortness of Breath  This is a recurrent problem. The average episode lasts 2 days. The problem occurs continuously.The problem has been gradually worsening. Pertinent negatives include no fever, no ear pain, no PND, no chest pain and no abdominal pain.    Past Medical History:  Diagnosis Date  . COPD (chronic obstructive pulmonary disease) ([place])   . HTN (hypertension)     Patient Active Problem List   Diagnosis Date Noted  . Non compliance w medication regimen 04/19/2016  . Benzodiazepine abuse 12/28/2015  . Anxiety state 05/18/2014  . Chronic fatigue 12/01/2012  . Tobacco abuse counseling 11/27/2012  . COPD (chronic obstructive pulmonary disease) ([place]) 10/13/2012  . DOE (dyspnea on exertion) 10/11/2012  . Nonspecific ST-T changes 10/11/2012  . Hypertension 10/11/2012    Past Surgical History:  Procedure Laterality Date  . HEMORRHOID SURGERY         Home Medications    Prior to Admission medications   Medication Sig Start Date End Date Taking? Authorizing Provider  albuterol (PROVENTIL HFA;VENTOLIN HFA) 108 (90 Base) MCG/ACT inhaler Inhale 1-2 puffs into the lungs every 6 (six) hours as needed for wheezing. 04/17/16  Yes Elberta Leatherwood, MD  ALPRAZolam Duanne Moron) 0.5 MG tablet Take 1 tablet (0.5 mg total) by mouth 2 (two) times daily as needed for anxiety. 04/19/16  Yes Elberta Leatherwood, MD  ibuprofen (ADVIL,MOTRIN) 200 MG tablet Take 200-400 mg by mouth every 6 (six) hours as needed for headache or moderate pain.   Yes Historical Provider, MD  lisinopril (PRINIVIL,ZESTRIL) 5 MG tablet Take 1 tablet (5 mg total) by mouth daily. 04/23/16  Yes Elberta Leatherwood, MD  azithromycin (ZITHROMAX) 250 MG tablet Take 1 tablet (250 mg total) by mouth daily. Take 1  every day until finished. 05/15/16   Merrily Pew, MD  guaiFENesin (MUCINEX) 600 MG 12 hr tablet Take 1 tablet (600 mg total) by mouth 2 (two) times daily as needed. 05/15/16   Merrily Pew, MD  mometasone-formoterol (DULERA) 200-5 MCG/ACT AERO Inhale 2 puffs into the lungs 2 (two) times daily. 05/15/16   Merrily Pew, MD  predniSONE (DELTASONE) 20 MG tablet 2 tabs po daily x 4 days 05/15/16   Merrily Pew, MD  tiotropium (SPIRIVA HANDIHALER) 18 MCG inhalation capsule Place 1 capsule (18 mcg total) into inhaler and inhale daily. 05/15/16   Merrily Pew, MD    Family History Family History  Problem Relation Age of Onset  . Cancer      Aunt    Social History Social History  Substance Use Topics  . Smoking status: Current Some Day Smoker    Packs/day: 0.75    Years: 40.00    Types: Cigarettes  . Smokeless tobacco: Former Systems developer    Quit date: 07/21/2012     Comment: "take a puff every once in a while"  . Alcohol use 0.0 oz/week     Comment: Social     Allergies   Patient has no known allergies.   Review of Systems Review of Systems  Constitutional: Negative for chills and fever.  HENT: Negative for ear pain.   Respiratory: Positive for shortness of breath.   Cardiovascular: Negative for chest pain and PND.  Gastrointestinal: Negative for  abdominal pain.  All other systems reviewed and are negative.    Physical Exam Updated Vital Signs BP (!) 160/117 (BP Location: Left Arm)   Pulse 88   Temp 98.1 F (36.7 C) (Oral)   Resp 18   SpO2 100%   Physical Exam  Constitutional: He is oriented to person, place, and time. He appears well-developed and well-nourished.  HENT:  Head: Normocephalic and atraumatic.  Eyes: Conjunctivae and EOM are normal.  Neck: Normal range of motion.  Cardiovascular: Normal rate.   No murmur heard. Pulmonary/Chest: Effort normal. No respiratory distress. He has decreased breath sounds. He has wheezes.  Abdominal: Soft. He exhibits no distension.    Musculoskeletal: Normal range of motion. He exhibits no edema or deformity.  Neurological: He is alert and oriented to person, place, and time.  Skin: Skin is warm and dry. No rash noted.  Nursing note and vitals reviewed.    ED Treatments / Results  Labs (all labs ordered are listed, but only abnormal results are displayed) Labs Reviewed - No data to display  EKG  EKG Interpretation None       Radiology Dg Chest 2 View  Result Date: 05/14/2016 CLINICAL DATA:  Shortness of breath and productive cough for 5 days EXAM: CHEST  2 VIEW COMPARISON:  06/20/2015 FINDINGS: The heart size and mediastinal contours are within normal limits. Hyperinflation is present. Both lungs are clear. The visualized skeletal structures are unremarkable. IMPRESSION: Hyperinflation without focal infiltrate Electronically Signed   By: Donavan Foil M.D.   On: 05/14/2016 18:06    Procedures Procedures (including critical care time)  Medications Ordered in ED Medications  guaiFENesin (MUCINEX) 12 hr tablet 600 mg (600 mg Oral Given 05/15/16 0019)  ipratropium-albuterol (DUONEB) 0.5-2.5 (3) MG/3ML nebulizer solution 3 mL (3 mLs Nebulization Given 05/14/16 2341)  predniSONE (DELTASONE) tablet 40 mg (40 mg Oral Given 05/15/16 0019)  azithromycin (ZITHROMAX) tablet 500 mg (500 mg Oral Given 05/15/16 0019)     Initial Impression / Assessment and Plan / ED Course  I have reviewed the triage vital signs and the nursing notes.  Pertinent labs & imaging results that were available during my care of the patient were reviewed by me and considered in my medical decision making (see chart for details).  Clinical Course     61 yo w/ likely COPD exacerbation. Treat with duoneb/prednisone/mucinex/azithromycin. Likely discharge home.   Improved symptoms. Will dc on symptomatic treatment with abx/steroids.  Final Clinical Impressions(s) / ED Diagnoses   Final diagnoses:  COPD exacerbation ([place])    New  Prescriptions Discharge Medication List as of 05/15/2016 12:27 AM    START taking these medications   Details  azithromycin (ZITHROMAX) 250 MG tablet Take 1 tablet (250 mg total) by mouth daily. Take 1 every day until finished., Starting Wed 05/15/2016, Print    predniSONE (DELTASONE) 20 MG tablet 2 tabs po daily x 4 days, Print         Merrily Pew, MD 05/15/16 0111

## 2016-05-15 MED ORDER — AZITHROMYCIN 250 MG PO TABS: 250.0000 mg | ORAL_TABLET | Freq: Every day | ORAL | 0 refills | Status: DC

## 2016-05-15 MED ORDER — TIOTROPIUM BROMIDE MONOHYDRATE 18 MCG IN CAPS: 18.0000 ug | ORAL_CAPSULE | Freq: Every day | RESPIRATORY_TRACT | 5 refills | Status: DC

## 2016-05-15 MED ORDER — GUAIFENESIN ER 600 MG PO TB12: 600.0000 mg | ORAL_TABLET | Freq: Two times a day (BID) | ORAL | 5 refills | Status: DC | PRN

## 2016-05-15 MED ORDER — MOMETASONE FURO-FORMOTEROL FUM 200-5 MCG/ACT IN AERO: 2.0000 | INHALATION_SPRAY | Freq: Two times a day (BID) | RESPIRATORY_TRACT | 3 refills | Status: DC

## 2016-05-15 MED ORDER — PREDNISONE 20 MG PO TABS: ORAL_TABLET | ORAL | 0 refills | Status: DC

## 2016-05-21 ENCOUNTER — Ambulatory Visit: Payer: Medicaid Other | Admitting: Family Medicine

## 2016-05-23 IMAGING — CR DG CHEST 2V
2 series · 2 of 2 positions shown · non-contrast
Comparison: 05/16/2015

CLINICAL DATA: Sick for approximately 1 month. Shortness of breath.
History of COPD.

EXAM:
CHEST  2 VIEW

[w chest pa]
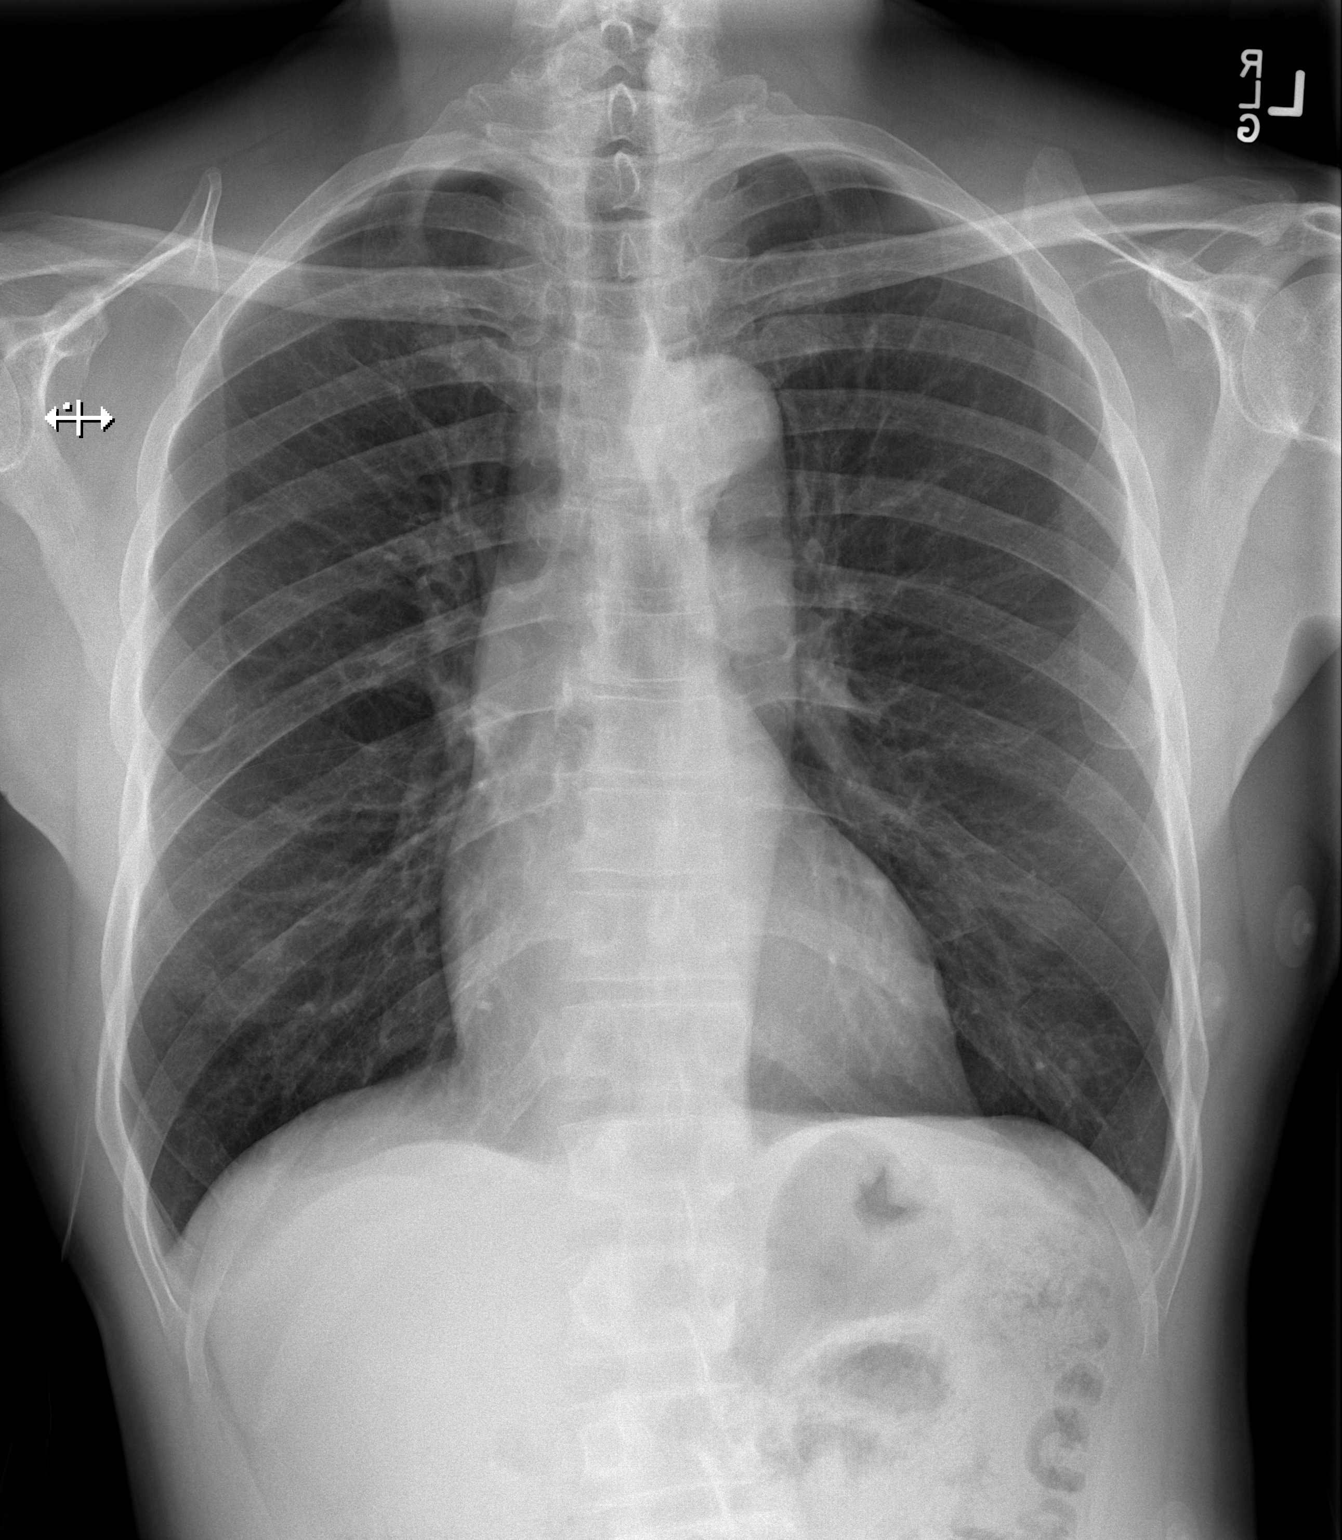

[w chest lat]
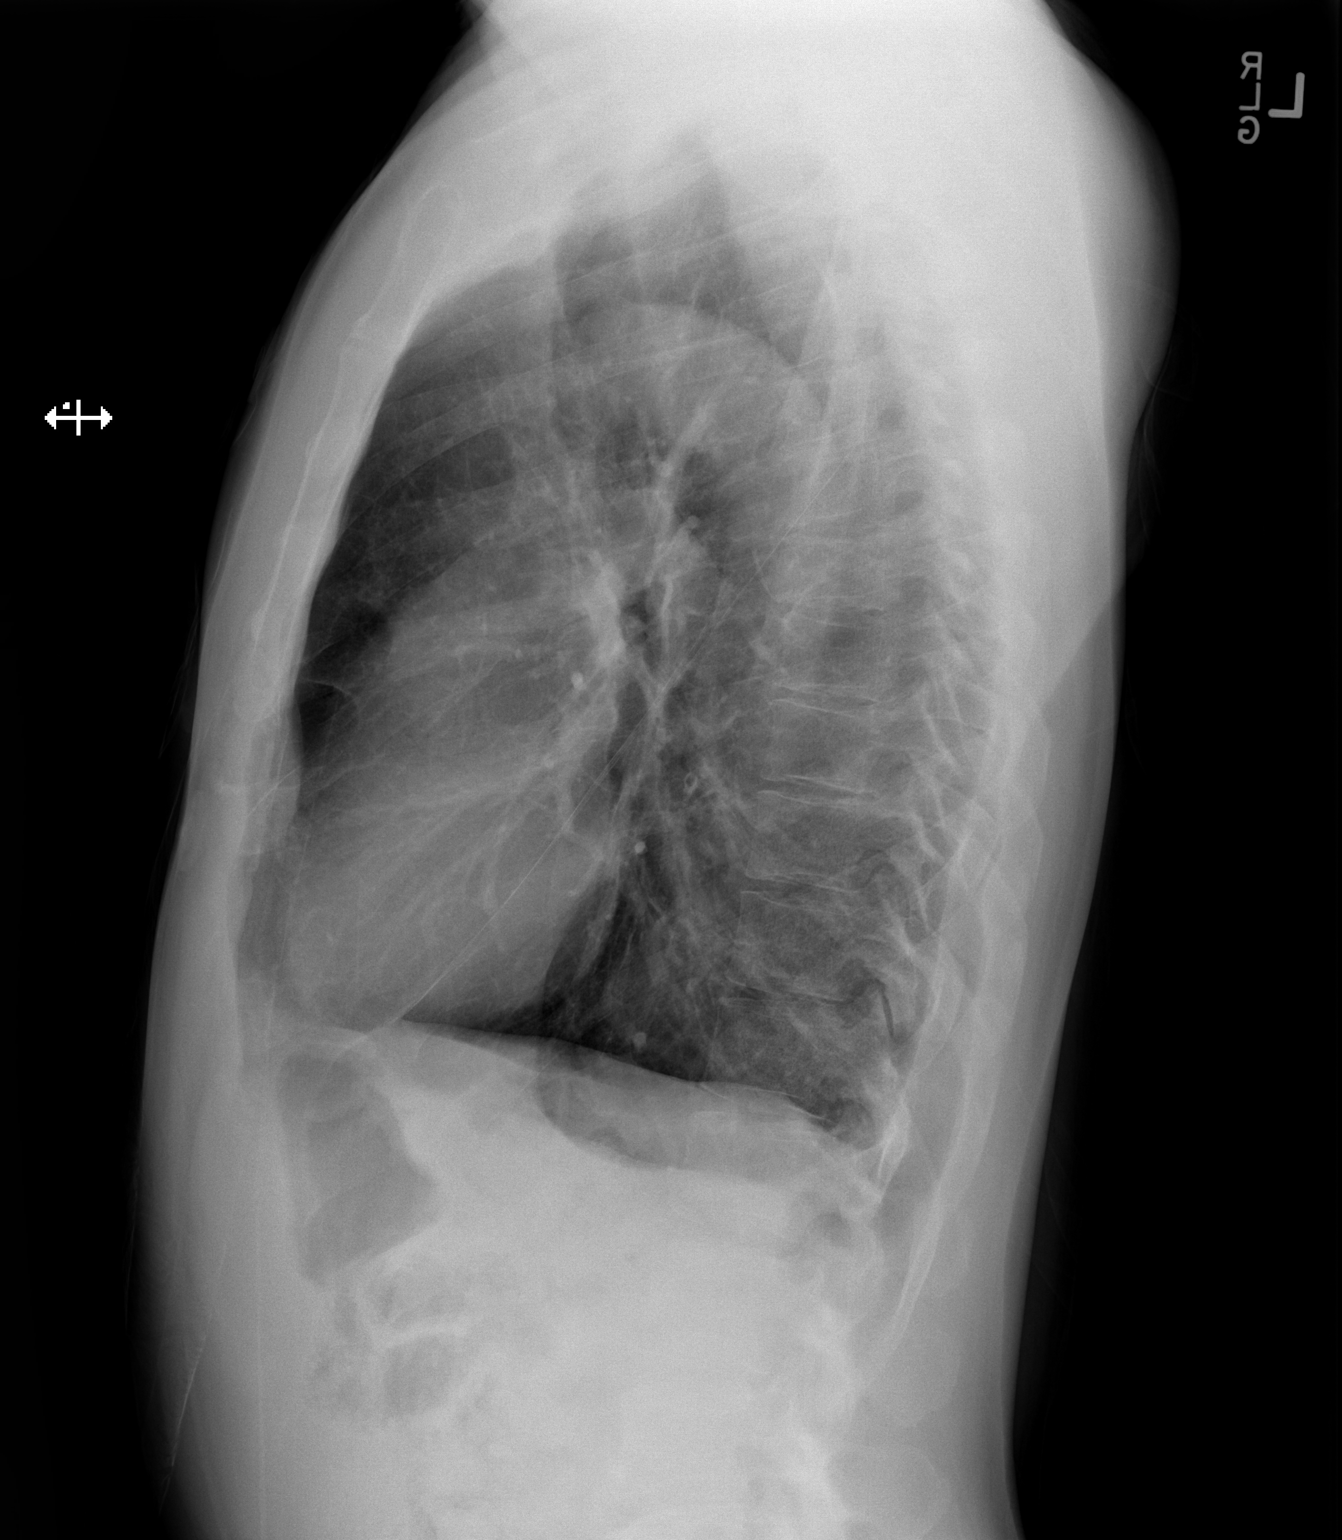

[2 of 2 positions shown; findings below may reference images not displayed]

FINDINGS: There is hyperinflation with flattening of the hemidiaphragms.
Findings are suggestive for COPD or emphysema. Heart and mediastinum
are within normal limits. There are ECG skin stickers overlying the
left lower chest. Lungs are clear without airspace disease or
pulmonary edema. No acute bone abnormality. No large pleural
effusions.
IMPRESSION: No acute cardiopulmonary disease.

Hyperinflation of the lungs. Findings compatible with history of
COPD.

## 2016-07-23 ENCOUNTER — Ambulatory Visit: Payer: Medicaid Other | Admitting: Family Medicine

## 2016-08-05 ENCOUNTER — Ambulatory Visit (INDEPENDENT_AMBULATORY_CARE_PROVIDER_SITE_OTHER): Payer: Medicaid Other | Admitting: Family Medicine

## 2016-08-05 ENCOUNTER — Encounter: Payer: Self-pay | Admitting: Family Medicine

## 2016-08-05 VITALS — BP 138/92 | HR 76 | Temp 98.7°F | Wt 143.0 lb

## 2016-08-05 DIAGNOSIS — Z79899 Other long term (current) drug therapy: Secondary | ICD-10-CM

## 2016-08-05 DIAGNOSIS — J432 Centrilobular emphysema: Secondary | ICD-10-CM | POA: Diagnosis not present

## 2016-08-05 DIAGNOSIS — F411 Generalized anxiety disorder: Secondary | ICD-10-CM | POA: Diagnosis not present

## 2016-08-05 MED ORDER — VENLAFAXINE HCL ER 75 MG PO CP24: 75.0000 mg | ORAL_CAPSULE | Freq: Every day | ORAL | 5 refills | Status: DC

## 2016-08-05 MED ORDER — MONTELUKAST SODIUM 10 MG PO TABS: 10.0000 mg | ORAL_TABLET | Freq: Every day | ORAL | 3 refills | Status: DC

## 2016-08-05 MED ORDER — ALPRAZOLAM 0.5 MG PO TABS: 0.5000 mg | ORAL_TABLET | Freq: Two times a day (BID) | ORAL | 0 refills | Status: DC | PRN

## 2016-08-05 MED ORDER — GUAIFENESIN ER 600 MG PO TB12: 600.0000 mg | ORAL_TABLET | Freq: Two times a day (BID) | ORAL | 1 refills | Status: DC | PRN

## 2016-08-05 NOTE — Patient Instructions (Signed)
Montelukast oral tablets What is this medicine? MONTELUKAST (mon te LOO kast) is used to prevent and treat the symptoms of asthma. It is also used to treat allergies. Do not use for an acute asthma attack. This medicine may be used for other purposes; ask your health care provider or pharmacist if you have questions. COMMON BRAND NAME(S): Singulair What should I tell my health care provider before I take this medicine? They need to know if you have any of these conditions: -liver disease -an unusual or allergic reaction to montelukast, other medicines, foods, dyes, or preservatives -pregnant or trying to get pregnant -breast-feeding How should I use this medicine? This medicine should be given by mouth. Follow the directions on the prescription label. Take this medicine at the same time every day. You may take this medicine with or without meals. Do not chew the tablets. Do not stop taking your medicine unless your doctor tells you to. Talk to your pediatrician regarding the use of this medicine in children. Special care may be needed. While this drug may be prescribed for children as young as 15 years of age for selected conditions, precautions do apply. Overdosage: If you think you have taken too much of this medicine contact a poison control center or emergency room at once. NOTE: This medicine is only for you. Do not share this medicine with others. What if I miss a dose? If you miss a dose, take it as soon as you can. If it is almost time for your next dose, take only that dose. Do not take double or extra doses. What may interact with this medicine? -anti-infectives like rifampin and rifabutin -medicines for diabetes like rosiglitazone and repaglinide -medicines for seizures like phenytoin, phenobarbital, and carbamazepine -paclitaxel This list may not describe all possible interactions. Give your health care provider a list of all the medicines, herbs, non-prescription drugs, or  dietary supplements you use. Also tell them if you smoke, drink alcohol, or use illegal drugs. Some items may interact with your medicine. What should I watch for while using this medicine? Visit your doctor or health care professional for regular checks on your progress. Tell your doctor or health care professional if your allergy or asthma symptoms do not improve. Take your medicine even when you do not have symptoms. Do not stop taking any of your medicine(s) unless your doctor tells you to. If you have asthma, talk to your doctor about what to do in an acute asthma attack. Always have your inhaled rescue medicine for asthma attacks with you. Patients and their families should watch for new or worsening thoughts of suicide or depression. Also watch for sudden changes in feelings such as feeling anxious, agitated, panicky, irritable, hostile, aggressive, impulsive, severely restless, overly excited and hyperactive, or not being able to sleep. Any worsening of mood or thoughts of suicide or dying should be reported to your health care professional right away. What side effects may I notice from receiving this medicine? Side effects that you should report to your doctor or health care professional as soon as possible: -allergic reactions like skin rash or hives, or swelling of the face, lips, or tongue -breathing problems -confusion -dark urine -fever or infection -flu-like symptoms -hallucinations -painful lumps under the skin -pain, tingling, numbness in the hands or feet -sinus pain or swelling -suicidal thoughts or other mood changes -tremors -trouble sleeping -uncontrolled muscle movements -unusual bleeding or bruising -yellowing of the eyes or skin Side effects that usually do not require   medical attention (report to your doctor or health care professional if they continue or are bothersome): -cough -dizziness -drowsiness -headache -nightmares -stomach upset -stuffy nose This  list may not describe all possible side effects. Call your doctor for medical advice about side effects. You may report side effects to FDA at 1-800-FDA-1088. Where should I keep my medicine? Keep out of the reach of children. Store at room temperature between 15 and 30 degrees C (59 and 86 degrees F). Protect from light and moisture. Keep this medicine in the original bottle. Throw away any unused medicine after the expiration date. NOTE: This sheet is a summary. It may not cover all possible information. If you have questions about this medicine, talk to your doctor, pharmacist, or health care provider.  2018 Elsevier/Gold Standard (2015-04-24 09:40:44)  

## 2016-08-05 NOTE — Progress Notes (Signed)
HPI  CC: Anxiety and COPD Patient is here to discuss the long-term treatment plan for his anxiety and COPD.  Anxiety: Patient continues to endorse high levels of anxiety. He endorses incomplete compliance with Effexor. Patient continues to endorse acknowledgment of the need to take this medication on a daily basis. Patient does not give any reasoning to why he continues to be noncompliant with any SSRI/SNRI treatment. Patient continues to endorse anxiety. He also continues to be very vague when it comes to the symptoms of anxiety he experiences. He denies any SI/HI.  COPD: Patient has had multiple hospital visits due to worsening shortness of breath and COPD exacerbations. He is asking if there is any additional medication that can be provided. He endorses good compliance with his inhaler medications and is able to appropriately described his treatment regimen. He denies any current shortness of breath. He denies any chest pain, weakness, fatigue, nausea, vomiting, diarrhea, fever, chills.  Review of Systems See HPI for ROS.   CC, SH/smoking status, and VS noted  Objective: BP (!) 138/92   Pulse 76   Temp 98.7 F (37.1 C) (Oral)   Wt 143 lb (64.9 kg)   BMI 23.80 kg/m  Gen: NAD, alert, cooperative,  CV: RRR, no murmur Resp: CTAB, very mild wheeze on right upper lung field, non-labored Ext: No edema, warm Neuro: Alert and oriented, Speech clear, No gross deficits   Assessment and plan:  COPD (chronic obstructive pulmonary disease) Stable: Patient is asking if there is any additional treatment he can receive for his COPD. He endorses good compliance with his Spiriva, [name], and when necessary albuterol. Patient has had 3 hospital visits for COPD exacerbations within the past 12 months. - Continue current medication regimen. - Initiate Singulair once daily.  Anxiety state Stable: Patient continues to be noncompliant with long-term medication regimens. He is also very much  persistent about receiving Xanax for his anxiety. I have been weaning him off of his Xanax for a period of time, due to concern for withdrawal, as patient had been on a significant amount of Xanax in the past. At this time I feel it is safe to say that patient likely does not need to be on Xanax in the future, regardless of his compliance or noncompliance with long-term anxiety/depression medication use. - Refilled Xanax at reduced amount #30 (from #40)  Next: I will refill his Xanax one additional time next month for 15-20 tablets total. At that time I will inform him that this will be the last time I write him this prescription, as I've provided ample time for him to be weaned off of Xanax and to initiate a more stable, long-term solution to his anxiety and depression   Orders Placed This Encounter  Procedures  . Pain Mgmt, Profile 4 Conf w/o mM, U    Meds ordered this encounter  Medications  . DISCONTD: venlafaxine XR (EFFEXOR-XR) 75 MG 24 hr capsule    Sig: Take 75 mg by mouth daily.    Refill:  0  . venlafaxine XR (EFFEXOR-XR) 75 MG 24 hr capsule    Sig: Take 1 capsule (75 mg total) by mouth daily.    Dispense:  30 capsule    Refill:  5  . guaiFENesin (MUCINEX) 600 MG 12 hr tablet    Sig: Take 1 tablet (600 mg total) by mouth 2 (two) times daily as needed.    Dispense:  30 tablet    Refill:  1  . ALPRAZolam Duanne Moron)  0.5 MG tablet    Sig: Take 1 tablet (0.5 mg total) by mouth 2 (two) times daily as needed for anxiety.    Dispense:  30 tablet    Refill:  0  . montelukast (SINGULAIR) 10 MG tablet    Sig: Take 1 tablet (10 mg total) by mouth at bedtime.    Dispense:  30 tablet    Refill:  3     Elberta Leatherwood, MD,MS,  PGY3 08/05/2016 6:02 PM

## 2016-08-05 NOTE — Assessment & Plan Note (Signed)
Stable: Patient continues to be noncompliant with long-term medication regimens. He is also very much persistent about receiving Xanax for his anxiety. I have been weaning him off of his Xanax for a period of time, due to concern for withdrawal, as patient had been on a significant amount of Xanax in the past. At this time I feel it is safe to say that patient likely does not need to be on Xanax in the future, regardless of his compliance or noncompliance with long-term anxiety/depression medication use. - Refilled Xanax at reduced amount #30 (from #40)  Next: I will refill his Xanax one additional time next month for 15-20 tablets total. At that time I will inform him that this will be the last time I write him this prescription, as I've provided ample time for him to be weaned off of Xanax and to initiate a more stable, long-term solution to his anxiety and depression

## 2016-08-05 NOTE — Assessment & Plan Note (Signed)
Stable: Patient is asking if there is any additional treatment he can receive for his COPD. He endorses good compliance with his Spiriva, [name], and when necessary albuterol. Patient has had 3 hospital visits for COPD exacerbations within the past 12 months. - Continue current medication regimen. - Initiate Singulair once daily.

## 2016-09-22 ENCOUNTER — Other Ambulatory Visit: Payer: Self-pay | Admitting: Family Medicine

## 2016-09-23 ENCOUNTER — Other Ambulatory Visit: Payer: Self-pay | Admitting: Family Medicine

## 2016-09-23 NOTE — Telephone Encounter (Signed)
Pt is calling to check the status of his refill request for ProAir and Alprazolam. He is completely out of the ProAIr and really needs this called in. He knows he has to wait on the other,jw

## 2016-09-24 NOTE — Telephone Encounter (Signed)
Called patient x 2, line rings once then silence. If patient calls back please inform him that proair has been sent to pharmacy.

## 2016-09-24 NOTE — Telephone Encounter (Signed)
Pt is very upset that the medications have not been refilled. Pt states he is out of ProAir and his breathing is getting worse. Pt wants medications refilled today. ep

## 2016-11-28 ENCOUNTER — Other Ambulatory Visit: Payer: Self-pay | Admitting: Family Medicine

## 2017-01-13 ENCOUNTER — Telehealth: Payer: Self-pay | Admitting: Internal Medicine

## 2017-01-13 ENCOUNTER — Other Ambulatory Visit: Payer: Self-pay | Admitting: Internal Medicine

## 2017-01-13 MED ORDER — MOMETASONE FURO-FORMOTEROL FUM 200-5 MCG/ACT IN AERO: 2.0000 | INHALATION_SPRAY | Freq: Two times a day (BID) | RESPIRATORY_TRACT | 3 refills | Status: DC

## 2017-01-13 NOTE — Telephone Encounter (Signed)
No paper or electronic requests received by me. Placed refill orders electronically today, 01/13/17.

## 2017-01-13 NOTE — Telephone Encounter (Signed)
Patient requests refill of dulera. Orders placed.

## 2017-01-13 NOTE — Telephone Encounter (Signed)
Pt is calling because he needs a refill on his Dulera called in. He is out and said that his pharmacy has been waiting since last week for an answer from [place]. jw

## 2017-01-17 ENCOUNTER — Ambulatory Visit: Payer: Self-pay | Admitting: Internal Medicine

## 2017-01-20 ENCOUNTER — Ambulatory Visit (INDEPENDENT_AMBULATORY_CARE_PROVIDER_SITE_OTHER): Payer: Medicaid Other | Admitting: Internal Medicine

## 2017-01-20 ENCOUNTER — Encounter: Payer: Self-pay | Admitting: Internal Medicine

## 2017-01-20 VITALS — BP 140/80 | HR 76 | Ht 65.0 in | Wt 145.6 lb

## 2017-01-20 DIAGNOSIS — Z716 Tobacco abuse counseling: Secondary | ICD-10-CM

## 2017-01-20 DIAGNOSIS — J432 Centrilobular emphysema: Secondary | ICD-10-CM | POA: Diagnosis present

## 2017-01-20 MED ORDER — ALBUTEROL SULFATE HFA 108 (90 BASE) MCG/ACT IN AERS: INHALATION_SPRAY | RESPIRATORY_TRACT | 3 refills | Status: DC

## 2017-01-20 MED ORDER — MOMETASONE FURO-FORMOTEROL FUM 200-5 MCG/ACT IN AERO: 2.0000 | INHALATION_SPRAY | Freq: Two times a day (BID) | RESPIRATORY_TRACT | 3 refills | Status: DC

## 2017-01-20 MED ORDER — GUAIFENESIN ER 600 MG PO TB12: 600.0000 mg | ORAL_TABLET | Freq: Two times a day (BID) | ORAL | 1 refills | Status: DC | PRN

## 2017-01-20 NOTE — Progress Notes (Signed)
[facility] Progress Note  Subjective:  Gavin Kane is a 61 y.o. male with history of COPD, tobacco abuse, HTN, and anxiety who presents with trouble breathing. Patient reports having increased exertional shortness of breath over the last month. He has been out of his dulera for 3-4 weeks. He denies increased sputum production or cough. However, he would like a prescription for mucinex in case of colds, as he feels this helps him during the winter months. He also had trouble getting medication to come out of his albuterol inhaler recently; took it to pharmacist who flushed the inhaler. It is working now but has little medication left. He has been using his spiriva inhaler. He did not try singulair treatment previously suggested by last PCP. ROS: No chest pain, no orthopnea, no fevers  Tobacco abuse: Has cut back to 1/2 pack from 1.5 ppd. Continues to smoke because helps his anxiety. Not ready to quit.   Of note, patient brought up past use of ativan and need for higher dose in order to fall asleep. He is not currently taking any medication for anxiety and was not aware of effexor prescription.   Objective: Blood pressure 140/80, pulse 76, height 5\' 5"  (1.651 m), weight 145 lb 9.6 oz (66 kg), SpO2 97 %. Body mass index is 24.23 kg/m. Constitutional: Well-appearing male in NAD HENT: Normal posterior oropharynx Cardiovascular: RRR, S1, S2, no m/r/g.  Pulmonary/Chest: Effort normal and breath sounds normal. No respiratory distress.  Psychiatric: Somewhat odd affect. Wavering between distrustful and overly grateful.  Vitals reviewed  ECHO 01/2016 with G1DD and EF of 60-65%, improved from 2014.   Assessment/Plan: COPD (chronic obstructive pulmonary disease) - Increased dyspnea without dulera inhaler and inconsistent use of albuterol inhaler recently due to blocked pump. However, no increased sputum production or cough to suggest exacerbation and sat'ing well on room air. No focal  lung findings to suggest fluid overload or pneumonia.  - Order for dulera refill placed last week but provided paper rx in case of issues at pharmacy. Also refilled albuterol and provided paper rx. - Ordered mucinex per patient request. Counseled on needing to stay well hydrated for this to help with clearing secretions.  - Recommended smoking cessation.   Tobacco abuse counseling - Congratulated patient on cutting back. He is not ready to quit yet. To return to [facility] to discuss cessation aids once ready.  Follow-up at earliest convenience to discuss mood. Patient to bring medications to review what he has/has been prescribed and how he is taking medications.   Olene Floss, MD [place], PGY-3

## 2017-01-20 NOTE — Patient Instructions (Signed)
Mr. Petrosian,  Please continue your two daily inhalers and your albuterol inhaler as needed. Continue to cut back on your smoking.   I ordered refill of mucinex to your pharmacy, as well.  Please make an appointment at your earliest convenience to discuss mood.  Congratulations to your daughter!  Best, Dr. Ola Spurr

## 2017-01-21 NOTE — Assessment & Plan Note (Signed)
-   Congratulated patient on cutting back. He is not ready to quit yet. To return to [facility] to discuss cessation aids once ready.

## 2017-01-21 NOTE — Assessment & Plan Note (Signed)
-   Increased dyspnea without dulera inhaler and inconsistent use of albuterol inhaler recently due to blocked pump. However, no increased sputum production or cough to suggest exacerbation and sat'ing well on room air. No focal lung findings to suggest fluid overload or pneumonia.  - Order for dulera refill placed last week but provided paper rx in case of issues at pharmacy. Also refilled albuterol and provided paper rx. - Ordered mucinex per patient request. Counseled on needing to stay well hydrated for this to help with clearing secretions.  - Recommended smoking cessation.

## 2017-02-19 ENCOUNTER — Other Ambulatory Visit: Payer: Self-pay | Admitting: Family Medicine

## 2017-03-22 ENCOUNTER — Encounter (HOSPITAL_COMMUNITY): Payer: Self-pay | Admitting: Emergency Medicine

## 2017-03-22 ENCOUNTER — Emergency Department (HOSPITAL_COMMUNITY)
Admission: EM | Admit: 2017-03-22 | Discharge: 2017-03-22 | Disposition: A | Discharge status: Home / Self Care | Payer: Medicaid Other | Attending: Emergency Medicine | Admitting: Emergency Medicine

## 2017-03-22 ENCOUNTER — Emergency Department (HOSPITAL_COMMUNITY): Payer: Medicaid Other

## 2017-03-22 DIAGNOSIS — J441 Chronic obstructive pulmonary disease with (acute) exacerbation: Secondary | ICD-10-CM | POA: Diagnosis not present

## 2017-03-22 DIAGNOSIS — R0789 Other chest pain: Secondary | ICD-10-CM | POA: Diagnosis present

## 2017-03-22 DIAGNOSIS — F17210 Nicotine dependence, cigarettes, uncomplicated: Secondary | ICD-10-CM | POA: Diagnosis not present

## 2017-03-22 DIAGNOSIS — R062 Wheezing: Secondary | ICD-10-CM | POA: Diagnosis not present

## 2017-03-22 DIAGNOSIS — I10 Essential (primary) hypertension: Secondary | ICD-10-CM | POA: Diagnosis not present

## 2017-03-22 DIAGNOSIS — R05 Cough: Secondary | ICD-10-CM | POA: Diagnosis not present

## 2017-03-22 DIAGNOSIS — Z79899 Other long term (current) drug therapy: Secondary | ICD-10-CM | POA: Insufficient documentation

## 2017-03-22 LAB — BASIC METABOLIC PANEL
ANION GAP: 8 (ref 5–15)
BUN: 13 mg/dL (ref 6–20)
CALCIUM: 9.6 mg/dL (ref 8.9–10.3)
CO2: 28 mmol/L (ref 22–32)
CREATININE: 0.97 mg/dL (ref 0.61–1.24)
Chloride: 102 mmol/L (ref 101–111)
GFR calc non Af Amer: >60 mL/min (ref >60)
Glucose, Bld: 96 mg/dL (ref 65–99)
Potassium: 3.8 mmol/L (ref 3.5–5.1)
SODIUM: 138 mmol/L (ref 135–145)

## 2017-03-22 LAB — CBC
HCT: 45 % (ref 39.0–52.0)
HEMOGLOBIN: 15.1 g/dL (ref 13.0–17.0)
MCH: 29.7 pg (ref 26.0–34.0)
MCHC: 33.6 g/dL (ref 30.0–36.0)
MCV: 88.6 fL (ref 78.0–100.0)
PLATELETS: 236 10*3/uL (ref 150–400)
RBC: 5.08 MIL/uL (ref 4.22–5.81)
RDW: 13.6 % (ref 11.5–15.5)
WBC: 8.5 10*3/uL (ref 4.0–10.5)

## 2017-03-22 LAB — D-DIMER, QUANTITATIVE: D-Dimer, Quant: <0.27 ug/mL-FEU (ref 0.00–0.50)

## 2017-03-22 MED ORDER — PREDNISONE 20 MG PO TABS: 60.0000 mg | ORAL_TABLET | Freq: Every day | ORAL | 0 refills | Status: AC

## 2017-03-22 MED ORDER — ALBUTEROL SULFATE (2.5 MG/3ML) 0.083% IN NEBU
5.0000 mg | INHALATION_SOLUTION | Freq: Once | RESPIRATORY_TRACT | Status: AC
  Administered 2017-03-22: 5 mg via RESPIRATORY_TRACT
  Filled 2017-03-22: qty 6

## 2017-03-22 MED ORDER — IPRATROPIUM-ALBUTEROL 0.5-2.5 (3) MG/3ML IN SOLN
3.0000 mL | Freq: Once | RESPIRATORY_TRACT | Status: AC
  Administered 2017-03-22: 3 mL via RESPIRATORY_TRACT
  Filled 2017-03-22: qty 3

## 2017-03-22 MED ORDER — HYDROCODONE-ACETAMINOPHEN 5-325 MG PO TABS
2.0000 | ORAL_TABLET | Freq: Once | ORAL | Status: AC
  Administered 2017-03-22: 2 via ORAL
  Filled 2017-03-22: qty 2

## 2017-03-22 MED ORDER — ALBUTEROL SULFATE HFA 108 (90 BASE) MCG/ACT IN AERS
2.0000 | INHALATION_SPRAY | Freq: Once | RESPIRATORY_TRACT | Status: AC
  Administered 2017-03-22: 2 via RESPIRATORY_TRACT
  Filled 2017-03-22: qty 6.7

## 2017-03-22 MED ORDER — NAPROXEN 375 MG PO TABS: 375.0000 mg | ORAL_TABLET | Freq: Two times a day (BID) | ORAL | 0 refills | Status: AC | PRN

## 2017-03-22 MED ORDER — PREDNISONE 20 MG PO TABS
60.0000 mg | ORAL_TABLET | Freq: Once | ORAL | Status: AC
  Administered 2017-03-22: 60 mg via ORAL
  Filled 2017-03-22: qty 3

## 2017-03-22 NOTE — ED Triage Notes (Signed)
Patient reports has COPD and been having SOB and dry cough for past 3-4 days. Reports chest pain when takes deep breath. Reports still smokes some.

## 2017-03-22 NOTE — ED Provider Notes (Signed)
[facility] DEPT Provider Note   CSN: [id number] Arrival date & time: 03/22/17  1007    History   Chief Complaint Chief Complaint  Patient presents with  . Shortness of Breath  . Cough    HPI Gavin Kane is a 61 y.o. male.  HPI  61 yo M with PMHx HTN, COPD here with right-sided chest pain. Pt reports he has been coughing more than usual for the last 3-4 days wth wheezing. He was coughing the other day and began to have mild right-sided sharp chest pain. It has since been constant. It is positional, reproducible with position changes and deep breath. No leg swelling. No fevers. No sputum production. No history of CAD. Denies any diaphoresis, SOB, DOE. Pain alleviates staying still.  Past Medical History:  Diagnosis Date  . COPD (chronic obstructive pulmonary disease) ([place])   . HTN (hypertension)     Patient Active Problem List   Diagnosis Date Noted  . Non compliance w medication regimen 04/19/2016  . Benzodiazepine abuse ([place]) 12/28/2015  . Anxiety state 05/18/2014  . Chronic fatigue 12/01/2012  . Tobacco abuse counseling 11/27/2012  . COPD (chronic obstructive pulmonary disease) ([place]) 10/13/2012  . DOE (dyspnea on exertion) 10/11/2012  . Nonspecific ST-T changes 10/11/2012  . Hypertension 10/11/2012    Past Surgical History:  Procedure Laterality Date  . HEMORRHOID SURGERY         Home Medications    Prior to Admission medications   Medication Sig Start Date End Date Taking? Authorizing Provider  albuterol (PROAIR HFA) 108 (90 Base) MCG/ACT inhaler inhale 1 to 2 puffs by mouth every 6 hours if needed for wheezing Patient taking differently: Inhale 2 puffs every 6 (six) hours as needed into the lungs for wheezing.  01/20/17  Yes Rogue Bussing, MD  lisinopril (PRINIVIL,ZESTRIL) 5 MG tablet Take 1 tablet (5 mg total) by mouth daily. 04/23/16  Yes McKeag, Marylynn Pearson, MD  mometasone-formoterol (DULERA) 200-5 MCG/ACT AERO Inhale  2 puffs into the lungs 2 (two) times daily. 01/20/17  Yes Rogue Bussing, MD  tiotropium (SPIRIVA HANDIHALER) 18 MCG inhalation capsule Place 1 capsule (18 mcg total) into inhaler and inhale daily. 05/15/16  Yes Mesner, Corene Cornea, MD  albuterol (PROAIR HFA) 108 (90 Base) MCG/ACT inhaler inhale 1 to 2 puffs by mouth every 6 hours if needed for wheezing Patient not taking: Reported on 03/22/2017 01/20/17   Rogue Bussing, MD  guaiFENesin (MUCINEX) 600 MG 12 hr tablet Take 1 tablet (600 mg total) by mouth 2 (two) times daily as needed. Patient not taking: Reported on 03/22/2017 01/20/17   Rogue Bussing, MD  naproxen (NAPROSYN) 375 MG tablet Take 1 tablet (375 mg total) 2 (two) times daily as needed for up to 7 days by mouth for moderate pain. 03/22/17 03/29/17  Duffy Bruce, MD  predniSONE (DELTASONE) 20 MG tablet Take 3 tablets (60 mg total) daily for 5 days by mouth. 03/22/17 03/27/17  Duffy Bruce, MD  venlafaxine XR (EFFEXOR-XR) 75 MG 24 hr capsule Take 1 capsule (75 mg total) by mouth daily. Patient not taking: Reported on 03/22/2017 08/05/16   McKeag, Marylynn Pearson, MD    Family History Family History  Problem Relation Age of Onset  . Cancer Unknown        Aunt    Social History Social History   Tobacco Use  . Smoking status: Current Some Day Smoker    Packs/day: 0.75    Years: 40.00  Pack years: 30.00    Types: Cigarettes  . Smokeless tobacco: Former Systems developer    Quit date: 07/21/2012  Substance Use Topics  . Alcohol use: Yes    Alcohol/week: 0.0 oz    Comment: Social  . Drug use: No     Allergies   Patient has no known allergies.   Review of Systems Review of Systems  Respiratory: Positive for shortness of breath.   Cardiovascular: Positive for chest pain.  All other systems reviewed and are negative.    Physical Exam Updated Vital Signs BP (!) 146/87 (BP Location: Right Arm)   Pulse 79   Temp 98 F (36.7 C) (Oral)   Resp 18   SpO2 97%    Physical Exam  Constitutional: He is oriented to person, place, and time. He appears well-developed and well-nourished. No distress.  HENT:  Head: Normocephalic and atraumatic.  Eyes: Conjunctivae are normal.  Neck: Neck supple.  Cardiovascular: Normal rate, regular rhythm and normal heart sounds. Exam reveals no friction rub.  No murmur heard. Pulmonary/Chest: Effort normal. No respiratory distress. He has wheezes (mild, expiratory wheezes). He has no rales.  Mild TTP over right anterior chest wall intercostal spaces  Abdominal: He exhibits no distension.  Musculoskeletal: He exhibits no edema.  Neurological: He is alert and oriented to person, place, and time. He exhibits normal muscle tone.  Skin: Skin is warm. Capillary refill takes less than 2 seconds.  Psychiatric: He has a normal mood and affect.  Nursing note and vitals reviewed.    ED Treatments / Results  Labs (all labs ordered are listed, but only abnormal results are displayed) Labs Reviewed  BASIC METABOLIC PANEL  CBC  D-DIMER, QUANTITATIVE (NOT AT [facility])    EKG  EKG Interpretation  Date/Time:  Saturday March 22 2017 10:17:53 EST Ventricular Rate:  86 PR Interval:    QRS Duration: 88 QT Interval:  395 QTC Calculation: 473 R Axis:   92 Text Interpretation:  Sinus rhythm Biatrial enlargement Right axis deviation Anteroseptal infarct, age indeterminate Nonspecific T abnormalities, lateral leads Baseline wander in lead(s) V3 Since last EKG, TWI in lateral leads now resolved or improved Otherwise no significant change Confirmed by Duffy Bruce [phone]) on 03/22/2017 3:27:14 PM       Radiology Dg Chest 2 View  Result Date: 03/22/2017 CLINICAL DATA:  Two-day history of dry cough. EXAM: CHEST  2 VIEW COMPARISON:  05/14/2016 FINDINGS: Hyperexpansion. The lungs are clear without focal pneumonia, edema, pneumothorax or pleural effusion. The cardiopericardial silhouette is within normal limits for size. The  visualized bony structures of the thorax are intact. IMPRESSION: No acute findings. Electronically Signed   By: Misty Stanley M.D.   On: 03/22/2017 13:29    Procedures Procedures (including critical care time)  Medications Ordered in ED Medications  albuterol (PROVENTIL) (2.5 MG/3ML) 0.083% nebulizer solution 5 mg (5 mg Nebulization Given 03/22/17 1023)  ipratropium-albuterol (DUONEB) 0.5-2.5 (3) MG/3ML nebulizer solution 3 mL (3 mLs Nebulization Given 03/22/17 1221)  predniSONE (DELTASONE) tablet 60 mg (60 mg Oral Given 03/22/17 1332)  HYDROcodone-acetaminophen (NORCO/VICODIN) 5-325 MG per tablet 2 tablet (2 tablets Oral Given 03/22/17 1332)  albuterol (PROVENTIL HFA;VENTOLIN HFA) 108 (90 Base) MCG/ACT inhaler 2 puff (2 puffs Inhalation Given 03/22/17 1425)     Initial Impression / Assessment and Plan / ED Course  I have reviewed the triage vital signs and the nursing notes.  Pertinent labs & imaging results that were available during my care of the patient were reviewed by  me and considered in my medical decision making (see chart for details).     61 yo M here with positional, reproducible R-sided CP after coughing x several days. Pt has normal HR, normal O2 sats, neg D-Dimer - do not suspect PE. CXR is w/o PNA or PTX. Lab work is reassuring. Exam shows mild wheezing, o/w unremarkable. Suspect MSK chest wall pain 2/2 mild COPD exacerbation. Will tx as such. EKG non-ischemic, no substernal or L sided pain, do not suspect ACS. Pt feels improved and is in agreement with this plan.  Final Clinical Impressions(s) / ED Diagnoses   Final diagnoses:  Chest wall pain  COPD exacerbation [facility])    ED Discharge Orders        Ordered    predniSONE (DELTASONE) 20 MG tablet  Daily     03/22/17 1430    naproxen (NAPROSYN) 375 MG tablet  2 times daily PRN     03/22/17 1430       Duffy Bruce, MD 03/22/17 2050

## 2017-03-22 NOTE — ED Notes (Signed)
Made Respiratory aware of neb treatment ordered.  

## 2017-03-22 NOTE — ED Notes (Signed)
Bed: RESA Expected date:  Expected time:  Means of arrival:  Comments: 

## 2017-03-22 NOTE — ED Notes (Signed)
Bed: [facility]1 Expected date:  Expected time:  Means of arrival:  Comments: 

## 2017-05-02 ENCOUNTER — Other Ambulatory Visit: Payer: Self-pay | Admitting: Internal Medicine

## 2017-05-02 MED ORDER — LISINOPRIL 5 MG PO TABS: 5.0000 mg | ORAL_TABLET | Freq: Every day | ORAL | 3 refills | Status: DC

## 2017-05-23 ENCOUNTER — Other Ambulatory Visit: Payer: Self-pay | Admitting: Internal Medicine

## 2017-05-23 ENCOUNTER — Telehealth: Payer: Self-pay | Admitting: Internal Medicine

## 2017-05-23 MED ORDER — ALBUTEROL SULFATE HFA 108 (90 BASE) MCG/ACT IN AERS: INHALATION_SPRAY | RESPIRATORY_TRACT | 3 refills | Status: DC

## 2017-05-23 MED ORDER — MOMETASONE FURO-FORMOTEROL FUM 200-5 MCG/ACT IN AERO: 2.0000 | INHALATION_SPRAY | Freq: Two times a day (BID) | RESPIRATORY_TRACT | 3 refills | Status: DC

## 2017-05-23 MED ORDER — TIOTROPIUM BROMIDE MONOHYDRATE 18 MCG IN CAPS: 18.0000 ug | ORAL_CAPSULE | Freq: Every day | RESPIRATORY_TRACT | 5 refills | Status: DC

## 2017-05-23 NOTE — Telephone Encounter (Signed)
Refilled all 3 inhalers to [facility] since he did not specify which he needs.

## 2017-05-23 NOTE — Telephone Encounter (Signed)
Pt needs refill on a pump, I'm guessing he is talking about his albuterol. Pt leaves to go out of town this afternoon

## 2017-05-23 NOTE — Telephone Encounter (Signed)
Provided refills for all 3 of patient's inhalers since he did not specify which one he needs.  Olene Floss, MD [place], PGY-3

## 2017-06-13 ENCOUNTER — Other Ambulatory Visit: Payer: Self-pay

## 2017-06-13 ENCOUNTER — Encounter (HOSPITAL_COMMUNITY): Payer: Self-pay | Admitting: Emergency Medicine

## 2017-06-13 ENCOUNTER — Emergency Department (HOSPITAL_COMMUNITY): Payer: Medicaid Other

## 2017-06-13 ENCOUNTER — Emergency Department (HOSPITAL_COMMUNITY)
Admission: EM | Admit: 2017-06-13 | Discharge: 2017-06-13 | Disposition: A | Discharge status: Home / Self Care | Payer: Medicaid Other | Attending: Emergency Medicine | Admitting: Emergency Medicine

## 2017-06-13 DIAGNOSIS — J441 Chronic obstructive pulmonary disease with (acute) exacerbation: Secondary | ICD-10-CM | POA: Diagnosis not present

## 2017-06-13 DIAGNOSIS — R05 Cough: Secondary | ICD-10-CM | POA: Diagnosis present

## 2017-06-13 LAB — BASIC METABOLIC PANEL
Anion gap: 6 (ref 5–15)
BUN: 15 mg/dL (ref 6–20)
CO2: 24 mmol/L (ref 22–32)
Calcium: 9.4 mg/dL (ref 8.9–10.3)
Chloride: 107 mmol/L (ref 101–111)
Creatinine, Ser: 0.82 mg/dL (ref 0.61–1.24)
GFR calc Af Amer: >60 mL/min (ref >60)
GFR calc non Af Amer: >60 mL/min (ref >60)
Glucose, Bld: 84 mg/dL (ref 65–99)
Potassium: 3.8 mmol/L (ref 3.5–5.1)
Sodium: 137 mmol/L (ref 135–145)

## 2017-06-13 LAB — CBC
HCT: 39.5 % (ref 39.0–52.0)
Hemoglobin: 13.2 g/dL (ref 13.0–17.0)
MCH: 28.9 pg (ref 26.0–34.0)
MCHC: 33.4 g/dL (ref 30.0–36.0)
MCV: 86.4 fL (ref 78.0–100.0)
Platelets: 255 10*3/uL (ref 150–400)
RBC: 4.57 MIL/uL (ref 4.22–5.81)
RDW: 13.8 % (ref 11.5–15.5)
WBC: 9.2 10*3/uL (ref 4.0–10.5)

## 2017-06-13 MED ORDER — PREDNISONE 20 MG PO TABS: 40.0000 mg | ORAL_TABLET | Freq: Every day | ORAL | 0 refills | Status: DC

## 2017-06-13 MED ORDER — PREDNISONE 20 MG PO TABS
40.0000 mg | ORAL_TABLET | Freq: Once | ORAL | Status: AC
  Administered 2017-06-13: 40 mg via ORAL
  Filled 2017-06-13: qty 2

## 2017-06-13 MED ORDER — ALBUTEROL SULFATE (2.5 MG/3ML) 0.083% IN NEBU
5.0000 mg | INHALATION_SOLUTION | Freq: Once | RESPIRATORY_TRACT | Status: AC
  Administered 2017-06-13: 5 mg via RESPIRATORY_TRACT
  Filled 2017-06-13: qty 6

## 2017-06-13 NOTE — ED Triage Notes (Signed)
Pt states he has COPD and is c/o cough  States he has been taking theraflu and mucinex but is not improving  Pt states he feels weak all over  Has a productive cough with yellow sputum  Pt states sxs started over a week ago

## 2017-06-13 NOTE — ED Provider Notes (Signed)
[facility] DEPT Provider Note   CSN: [id number] Arrival date & time: 06/13/17  0604     History   Chief Complaint Chief Complaint  Patient presents with  . Cough  . Shortness of Breath    HPI Gavin Kane is a 62 y.o. male.  HPI     62 year old male with cough and dyspnea.  Onset about a week ago.  Persistent since then.  Has been taking TheraFlu and Mucinex without improvement.  Feels generally weak.  Denies myalgias.  No fevers.   Cough is productive.  He has a past history of COPD.  He is not on home oxygen.  Past Medical History:  Diagnosis Date  . COPD (chronic obstructive pulmonary disease) ([place])   . HTN (hypertension)     Patient Active Problem List   Diagnosis Date Noted  . Non compliance w medication regimen 04/19/2016  . Benzodiazepine abuse ([place]) 12/28/2015  . Anxiety state 05/18/2014  . Chronic fatigue 12/01/2012  . Tobacco abuse counseling 11/27/2012  . COPD (chronic obstructive pulmonary disease) ([place]) 10/13/2012  . DOE (dyspnea on exertion) 10/11/2012  . Nonspecific ST-T changes 10/11/2012  . Hypertension 10/11/2012    Past Surgical History:  Procedure Laterality Date  . HEMORRHOID SURGERY         Home Medications    Prior to Admission medications   Medication Sig Start Date End Date Taking? Authorizing Provider  albuterol (PROAIR HFA) 108 (90 Base) MCG/ACT inhaler inhale 1 to 2 puffs by mouth every 6 hours if needed for wheezing Patient taking differently: Inhale 2 puffs every 6 (six) hours as needed into the lungs for wheezing.  01/20/17  Yes Rogue Bussing, MD  dextromethorphan-guaiFENesin [facility] DM) 30-600 MG 12hr tablet Take 1 tablet by mouth 2 (two) times daily as needed for cough.   Yes [provider]  DM-Phenylephrine-Acetaminophen (THERAFLU EXPRESSMAX) 10-5-325 MG/15ML LIQD Take 30 mLs by mouth every 8 (eight) hours as needed (cold).   Yes [provider]  lisinopril  (PRINIVIL,ZESTRIL) 5 MG tablet Take 1 tablet (5 mg total) by mouth daily. 05/02/17  Yes Fitzgerald, Sharman Cheek, MD  mometasone-formoterol [facility]) 200-5 MCG/ACT AERO Inhale 2 puffs into the lungs 2 (two) times daily. 05/23/17  Yes Rogue Bussing, MD  tiotropium (SPIRIVA HANDIHALER) 18 MCG inhalation capsule Place 1 capsule (18 mcg total) into inhaler and inhale daily. 05/23/17  Yes Rogue Bussing, MD  albuterol [facility]) 108 [phone] Base) MCG/ACT inhaler inhale 1 to 2 puffs by mouth every 6 hours if needed for wheezing Patient not taking: Reported on 06/13/2017 05/23/17   Rogue Bussing, MD  venlafaxine XR (EFFEXOR-XR) 75 MG 24 hr capsule Take 1 capsule (75 mg total) by mouth daily. Patient not taking: Reported on 03/22/2017 08/05/16   McKeag, Marylynn Pearson, MD    Family History Family History  Problem Relation Age of Onset  . Cancer Unknown        Aunt    Social History Social History   Tobacco Use  . Smoking status: Current Some Day Smoker    Packs/day: 0.75    Years: 40.00    Pack years: 30.00    Types: Cigarettes  . Smokeless tobacco: Former Systems developer    Quit date: 07/21/2012  Substance Use Topics  . Alcohol use: Yes    Alcohol/week: 0.0 oz    Comment: Social  . Drug use: No     Allergies   Patient has no known allergies.   Review  of Systems Review of Systems  All systems reviewed and negative, other than as noted in HPI. Physical Exam Updated Vital Signs BP 138/74 (BP Location: Left Arm)   Pulse 78   Temp 98 F (36.7 C) (Oral)   Resp 18   SpO2 100%   Physical Exam  Constitutional: He appears well-developed and well-nourished. No distress.  HENT:  Head: Normocephalic and atraumatic.  Eyes: Conjunctivae are normal. Right eye exhibits no discharge. Left eye exhibits no discharge.  Neck: Neck supple.  Cardiovascular: Normal rate, regular rhythm and normal heart sounds. Exam reveals no gallop and no friction rub.  No murmur heard. Pulmonary/Chest:  Effort normal.  Expiratory wheezing bilaterally  Abdominal: Soft. He exhibits no distension. There is no tenderness.  Musculoskeletal: He exhibits no edema or tenderness.  Neurological: He is alert.  Skin: Skin is warm and dry.  Psychiatric: He has a normal mood and affect. His behavior is normal. Thought content normal.  Nursing note and vitals reviewed.    ED Treatments / Results  Labs (all labs ordered are listed, but only abnormal results are displayed) Labs Reviewed  BASIC METABOLIC PANEL  CBC    EKG  EKG Interpretation  Date/Time:  Friday June 13 2017 06:13:35 EST Ventricular Rate:  80 PR Interval:    QRS Duration: 80 QT Interval:  360 QTC Calculation: 416 R Axis:   65 Text Interpretation:  Sinus rhythm Left atrial enlargement Abnormal R-wave progression, early transition Borderline T wave abnormalities Confirmed by Dory Horn) on 06/13/2017 6:17:39 AM       Radiology Dg Chest 2 View  Result Date: 06/13/2017 CLINICAL DATA:  Shortness of breath, productive cough. EXAM: CHEST  2 VIEW COMPARISON:  Radiographs of March 22, 2017. FINDINGS: The heart size and mediastinal contours are within normal limits. Both lungs are clear. No pneumothorax or pleural effusion is noted. The visualized skeletal structures are unremarkable. IMPRESSION: No active cardiopulmonary disease. Electronically Signed   By: Marijo Conception, M.D.   On: 06/13/2017 11:28    Procedures Procedures (including critical care time)  Medications Ordered in ED Medications  albuterol (PROVENTIL) (2.5 MG/3ML) 0.083% nebulizer solution 5 mg (not administered)  predniSONE (DELTASONE) tablet 40 mg (not administered)  albuterol (PROVENTIL) (2.5 MG/3ML) 0.083% nebulizer solution 5 mg (5 mg Nebulization Given 06/13/17 0640)     Initial Impression / Assessment and Plan / ED Course  I have reviewed the triage vital signs and the nursing notes.  Pertinent labs & imaging results that were available  during my care of the patient were reviewed by me and considered in my medical decision making (see chart for details).     62 year old male with cough, body aches.  May be bronchitis or exacerbation of acute abnormality.  Are without acute abnormality.  Saturations are normal on room air.  Will place on a course of steroids.  Final Clinical Impressions(s) / ED Diagnoses   Final diagnoses:  COPD exacerbation [facility])    ED Discharge Orders    None       Virgel Manifold, MD 06/14/17 [phone]

## 2017-10-07 ENCOUNTER — Other Ambulatory Visit: Payer: Self-pay

## 2017-10-07 ENCOUNTER — Emergency Department (HOSPITAL_COMMUNITY)
Admission: EM | Admit: 2017-10-07 | Discharge: 2017-10-07 | Disposition: A | Discharge status: Home / Self Care | Payer: Self-pay | Attending: Emergency Medicine | Admitting: Emergency Medicine

## 2017-10-07 ENCOUNTER — Emergency Department (HOSPITAL_COMMUNITY): Payer: Self-pay

## 2017-10-07 ENCOUNTER — Encounter (HOSPITAL_COMMUNITY): Payer: Self-pay | Admitting: Emergency Medicine

## 2017-10-07 DIAGNOSIS — Z79899 Other long term (current) drug therapy: Secondary | ICD-10-CM | POA: Insufficient documentation

## 2017-10-07 DIAGNOSIS — J449 Chronic obstructive pulmonary disease, unspecified: Secondary | ICD-10-CM | POA: Insufficient documentation

## 2017-10-07 DIAGNOSIS — I10 Essential (primary) hypertension: Secondary | ICD-10-CM | POA: Insufficient documentation

## 2017-10-07 DIAGNOSIS — J441 Chronic obstructive pulmonary disease with (acute) exacerbation: Secondary | ICD-10-CM

## 2017-10-07 DIAGNOSIS — F17210 Nicotine dependence, cigarettes, uncomplicated: Secondary | ICD-10-CM | POA: Insufficient documentation

## 2017-10-07 LAB — BASIC METABOLIC PANEL
ANION GAP: 10 (ref 5–15)
BUN: 10 mg/dL (ref 6–20)
CHLORIDE: 108 mmol/L (ref 101–111)
CO2: 23 mmol/L (ref 22–32)
Calcium: 9.6 mg/dL (ref 8.9–10.3)
Creatinine, Ser: 0.9 mg/dL (ref 0.61–1.24)
GFR calc Af Amer: >60 mL/min (ref >60)
GLUCOSE: 107 mg/dL — AB (ref 65–99)
POTASSIUM: 4 mmol/L (ref 3.5–5.1)
Sodium: 141 mmol/L (ref 135–145)

## 2017-10-07 LAB — CBC
HEMATOCRIT: 43.6 % (ref 39.0–52.0)
HEMOGLOBIN: 14.3 g/dL (ref 13.0–17.0)
MCH: 28.3 pg (ref 26.0–34.0)
MCHC: 32.8 g/dL (ref 30.0–36.0)
MCV: 86.3 fL (ref 78.0–100.0)
Platelets: 221 10*3/uL (ref 150–400)
RBC: 5.05 MIL/uL (ref 4.22–5.81)
RDW: 12.8 % (ref 11.5–15.5)
WBC: 6.8 10*3/uL (ref 4.0–10.5)

## 2017-10-07 LAB — I-STAT TROPONIN, ED: Troponin i, poc: 0 ng/mL (ref 0.00–0.08)

## 2017-10-07 MED ORDER — MOMETASONE FURO-FORMOTEROL FUM 200-5 MCG/ACT IN AERO: 2.0000 | INHALATION_SPRAY | Freq: Two times a day (BID) | RESPIRATORY_TRACT | 3 refills | Status: DC

## 2017-10-07 MED ORDER — ALBUTEROL SULFATE HFA 108 (90 BASE) MCG/ACT IN AERS: INHALATION_SPRAY | RESPIRATORY_TRACT | 3 refills | Status: DC

## 2017-10-07 MED ORDER — TIOTROPIUM BROMIDE MONOHYDRATE 18 MCG IN CAPS: 18.0000 ug | ORAL_CAPSULE | Freq: Every day | RESPIRATORY_TRACT | 0 refills | Status: DC

## 2017-10-07 MED ORDER — LISINOPRIL 5 MG PO TABS: 5.0000 mg | ORAL_TABLET | Freq: Every day | ORAL | 0 refills | Status: DC

## 2017-10-07 MED ORDER — IPRATROPIUM-ALBUTEROL 0.5-2.5 (3) MG/3ML IN SOLN
3.0000 mL | Freq: Once | RESPIRATORY_TRACT | Status: AC
  Administered 2017-10-07: 3 mL via RESPIRATORY_TRACT
  Filled 2017-10-07: qty 3

## 2017-10-07 NOTE — Discharge Instructions (Addendum)
Please read attached information. If you experience any new or worsening signs or symptoms please return to the emergency room for evaluation. Please follow-up with your primary care provider or specialist as discussed. Please use medication prescribed only as directed and discontinue taking if you have any concerning signs or symptoms.   °

## 2017-10-07 NOTE — Discharge Planning (Signed)
ED CM consulted by EDP H[place] for medication assistance  [facility] reviewed chart review information and spoke with the pt about [facility] MATCH program ($3 co pay for each Rx through [facility] program, does not include refills, 7 day expiration of [place] letter and choice of pharmacies). Pt is eligible for [facility] MATCH program (unable to find pt listed in PROCARE per cardholder name inquiry) and has agreed to accept [place] under terms discussed. PROCARE information entered. [place] letter completed and provided to pt.  EDCM updated EDP and ED RN Katie.

## 2017-10-07 NOTE — ED Triage Notes (Addendum)
Sob x 1 month , out of his inhalers, got bad several weeks ago and this is first time he has had a chance  To come denies cp but has grunting resp

## 2017-10-07 NOTE — ED Provider Notes (Signed)
[facility] EMERGENCY DEPARTMENT Provider Note   CSN: [id number] Arrival date & time: 10/07/17  4193     History   Chief Complaint Chief Complaint  Patient presents with  . Shortness of Breath    HPI Gavin Kane is a 62 y.o. male.  HPI   62 year old male presents today with complaints of COPD.  Patient notes that he is usually maintained on breathing treatments at home.  He notes that he recently found out he did not have Medicaid.  He notes he has been unable to have his prescriptions filled.  He notes this has been approximately 1 month with worsening COPD symptoms including chest tightness and shortness of breath.  Patient notes is identical to his COPD, denies any chest pain diaphoresis fever or cough.  Patient also notes that he has not taken his blood pressure medication either.   Past Medical History:  Diagnosis Date  . COPD (chronic obstructive pulmonary disease) ([place])   . HTN (hypertension)     Patient Active Problem List   Diagnosis Date Noted  . Non compliance w medication regimen 04/19/2016  . Benzodiazepine abuse ([place]) 12/28/2015  . Anxiety state 05/18/2014  . Chronic fatigue 12/01/2012  . Tobacco abuse counseling 11/27/2012  . COPD (chronic obstructive pulmonary disease) ([place]) 10/13/2012  . DOE (dyspnea on exertion) 10/11/2012  . Nonspecific ST-T changes 10/11/2012  . Hypertension 10/11/2012    Past Surgical History:  Procedure Laterality Date  . HEMORRHOID SURGERY          Home Medications    Prior to Admission medications   Medication Sig Start Date End Date Taking? Authorizing Provider  ibuprofen (ADVIL,MOTRIN) 200 MG tablet Take 200 mg by mouth as needed for headache or moderate pain.   Yes [provider]  venlafaxine XR (EFFEXOR-XR) 75 MG 24 hr capsule Take 1 capsule (75 mg total) by mouth daily. 08/05/16  Yes McKeag, Marylynn Pearson, MD  albuterol (PROAIR HFA) 108 [phone] Base) MCG/ACT inhaler inhale 1 to 2 puffs by mouth every  6 hours if needed for wheezing Patient not taking: Reported on 06/13/2017 05/23/17   Rogue Bussing, MD  albuterol [facility] HFA) 108 [phone] Base) MCG/ACT inhaler inhale 1 to 2 puffs by mouth every 6 hours if needed for wheezing 10/07/17   Dontreal Miera, Dellis Filbert, PA-C  lisinopril (PRINIVIL,ZESTRIL) 5 MG tablet Take 1 tablet (5 mg total) by mouth daily. 10/07/17   Kyliegh Jester, Dellis Filbert, PA-C  mometasone-formoterol (DULERA) 200-5 MCG/ACT AERO Inhale 2 puffs into the lungs 2 (two) times daily. 10/07/17   Hasna Stefanik, Dellis Filbert, PA-C  tiotropium (SPIRIVA HANDIHALER) 18 MCG inhalation capsule Place 1 capsule (18 mcg total) into inhaler and inhale daily. 10/07/17   Okey Regal, PA-C    Family History Family History  Problem Relation Age of Onset  . Cancer Unknown        Aunt    Social History Social History   Tobacco Use  . Smoking status: Current Some Day Smoker    Packs/day: 0.75    Years: 40.00    Pack years: 30.00    Types: Cigarettes  . Smokeless tobacco: Former Systems developer    Quit date: 07/21/2012  Substance Use Topics  . Alcohol use: Yes    Alcohol/week: 0.0 oz    Comment: Social  . Drug use: No     Allergies   Patient has no known allergies.   Review of Systems Review of Systems  All other systems reviewed and are negative.   Physical Exam  Updated Vital Signs BP (!) 160/96 (BP Location: Right Arm)   Pulse 67   Temp 97.7 F (36.5 C) (Oral)   Resp 16   SpO2 100%   Physical Exam  Constitutional: He is oriented to person, place, and time. He appears well-developed and well-nourished.  HENT:  Head: Normocephalic and atraumatic.  Eyes: Pupils are equal, round, and reactive to light. Conjunctivae are normal. Right eye exhibits no discharge. Left eye exhibits no discharge. No scleral icterus.  Neck: Normal range of motion. No JVD present. No tracheal deviation present.  Cardiovascular: Normal rate, regular rhythm and normal heart sounds.  Pulmonary/Chest: Effort normal. No stridor. He has  wheezes. He has no rales. He exhibits no tenderness.  Diminished lung sounds minor wheeze bilateral, no respiratory distress  Neurological: He is alert and oriented to person, place, and time. Coordination normal.  Psychiatric: He has a normal mood and affect. His behavior is normal. Judgment and thought content normal.  Nursing note and vitals reviewed.   ED Treatments / Results  Labs (all labs ordered are listed, but only abnormal results are displayed) Labs Reviewed  BASIC METABOLIC PANEL - Abnormal; Notable for the following components:      Result Value   Glucose, Bld 107 (*)    All other components within normal limits  CBC  I-STAT TROPONIN, ED    EKG None  Radiology Dg Chest 2 View  Result Date: 10/07/2017 CLINICAL DATA:  62 year old male with increasing shortness of breath for 1 month. EXAM: CHEST - 2 VIEW COMPARISON:  06/13/2017 and earlier. FINDINGS: Lung volumes and mediastinal contours remain normal. Visualized tracheal air column is within normal limits. No pneumothorax, pulmonary edema, pleural effusion or confluent pulmonary opacity. No acute osseous abnormality identified. Negative visible bowel gas pattern. IMPRESSION: Stable and negative.  No acute cardiopulmonary abnormality. Electronically Signed   By: Genevie Ann M.D.   On: 10/07/2017 09:47    Procedures Procedures (including critical care time)  Medications Ordered in ED Medications  ipratropium-albuterol (DUONEB) 0.5-2.5 (3) MG/3ML nebulizer solution 3 mL (3 mLs Nebulization Given 10/07/17 1521)     Initial Impression / Assessment and Plan / ED Course  I have reviewed the triage vital signs and the nursing notes.  Pertinent labs & imaging results that were available during my care of the patient were reviewed by me and considered in my medical decision making (see chart for details).     Labs: I-STAT troponin, BMP, CBC  Imaging: Dg chest 2 view   Consults:  Therapeutics: Albuterol  Discharge Meds:  Albuterol, lisinopril, Dulera Spiriva  Assessment/Plan: 62 year old male presents today with complaints of COPD.  Patient has not had his medications in 1 month.  He does appear very well no respiratory distress, minor wheeze.  Patient had improvement in symptoms with breathing treatment here.  Discussed case with case management, patient will be able to fill his prescriptions as an outpatient.  He will be given refill of needed prescriptions and outpatient follow-up with primary care strict return precautions.  Patient has no signs of any other life-threatening etiology or severe COPD exacerbation.  Patient verbalized understanding and agreement to today's plan had no further questions or concerns the time discharge.  Final Clinical Impressions(s) / ED Diagnoses   Final diagnoses:  COPD exacerbation [facility])    ED Discharge Orders        Ordered    lisinopril (PRINIVIL,ZESTRIL) 5 MG tablet  Daily     10/07/17 1602    mometasone-formoterol (  DULERA) 200-5 MCG/ACT AERO  2 times daily     10/07/17 1602    tiotropium (SPIRIVA HANDIHALER) 18 MCG inhalation capsule  Daily     10/07/17 1602    albuterol (PROAIR HFA) 108 (90 Base) MCG/ACT inhaler     10/07/17 1602       Okey Regal, PA-C 10/07/17 1643    Davonna Belling, MD 10/07/17 2220

## 2017-10-08 ENCOUNTER — Telehealth: Payer: Self-pay | Admitting: *Deleted

## 2017-10-08 NOTE — Telephone Encounter (Signed)
Pharmacy called related to inactive Gavin Kane card.  EDCM researched [facility] system to find that expiration date was entered incorrectly.  EDCM corrected card and stayed on phone to insure card was active. Pt was able to purchase Rx.

## 2017-11-17 ENCOUNTER — Other Ambulatory Visit: Payer: Self-pay

## 2017-11-17 ENCOUNTER — Ambulatory Visit (INDEPENDENT_AMBULATORY_CARE_PROVIDER_SITE_OTHER): Payer: Self-pay | Admitting: Family Medicine

## 2017-11-17 VITALS — BP 124/80 | HR 84 | Temp 98.2°F | Ht 65.0 in | Wt 140.0 lb

## 2017-11-17 DIAGNOSIS — J432 Centrilobular emphysema: Secondary | ICD-10-CM

## 2017-11-17 DIAGNOSIS — Z716 Tobacco abuse counseling: Secondary | ICD-10-CM | POA: Insufficient documentation

## 2017-11-17 MED ORDER — LISINOPRIL 5 MG PO TABS: 5.0000 mg | ORAL_TABLET | Freq: Every day | ORAL | 0 refills | Status: DC

## 2017-11-17 MED ORDER — TIOTROPIUM BROMIDE MONOHYDRATE 18 MCG IN CAPS: 18.0000 ug | ORAL_CAPSULE | Freq: Every day | RESPIRATORY_TRACT | 0 refills | Status: DC

## 2017-11-17 MED ORDER — ALBUTEROL SULFATE HFA 108 (90 BASE) MCG/ACT IN AERS: INHALATION_SPRAY | RESPIRATORY_TRACT | 3 refills | Status: DC

## 2017-11-17 MED ORDER — MOMETASONE FURO-FORMOTEROL FUM 200-5 MCG/ACT IN AERO: 2.0000 | INHALATION_SPRAY | Freq: Two times a day (BID) | RESPIRATORY_TRACT | 3 refills | Status: DC

## 2017-11-17 NOTE — Assessment & Plan Note (Signed)
Counseled patient to stop smoking. Patient has low motivation to quit "cold [place]" but is willing to try cutting back.  Return in 2 weeks to discuss further smoking cessation strategies.

## 2017-11-17 NOTE — Patient Instructions (Signed)
It was great to meet you today! Thank you for letting me participate in your care!  Today, we discussed getting access to your medication. I am sending them to our outpatient pharmacy so that you can afford your medications until you get your orange card.  Please return in two weeks to discuss smoking cessation and get Pulmonary Function Test with Dr. Valentina Lucks.  Be well, Gavin Rutherford, DO PGY-2, [facility]

## 2017-11-17 NOTE — Progress Notes (Signed)
Subjective: No chief complaint on file.    HPI: Gavin Kane is a 62 y.o. presenting to clinic today to discuss the following:  Increasing SOB Patient has been having increased SOB and has lost medicaid coverage. He did have an orange card years ago but no longer has one. He has not been able to afford his medications. He was in the ED and given refills for one month. He presents today b/c he has ran out of his medications.   Smoking Cessation Patient is somewhat motivated to quit but has tried patches in the past with little success. He think he can try cutting back from 1 pack per 1.5 days to 1 pack per 2 days. He is interested in learning more about smoking cessation and will return for an appointment to discuss further. Counseled patient that smoking cessation is best thing to help his respiratory issues.  Patient was given refills and sent to [place] using the [place].  Health Maintenance: none     ROS noted in HPI.   Past Medical, Surgical, Social, and Family History Reviewed & Updated per EMR.   Pertinent Historical Findings include:   Social History   Tobacco Use  Smoking Status Current Some Day Smoker  . Packs/day: 0.75  . Years: 40.00  . Pack years: 30.00  . Types: Cigarettes  Smokeless Tobacco Former Systems developer  . Quit date: 07/21/2012    Objective: BP 124/80   Pulse 84   Temp 98.2 F (36.8 C) (Oral)   Ht 5\' 5"  (1.651 m)   Wt 140 lb (63.5 kg)   SpO2 99%   BMI 23.30 kg/m  Vitals and nursing notes reviewed  Physical Exam  Constitutional: He is oriented to person, place, and time.  Cardiovascular: Normal rate, regular rhythm, normal heart sounds and intact distal pulses.  No murmur heard. Pulmonary/Chest: Effort normal. No respiratory distress. He has wheezes. He has no rales.  Musculoskeletal: Normal range of motion. He exhibits no edema.  Neurological: He is alert and oriented to person, place, and time.  Skin: Skin is  warm and dry. No rash noted. No erythema.   No results found for this or any previous visit (from the past 72 hour(s)).  Assessment/Plan:  COPD (chronic obstructive pulmonary disease) Patient has not had access to refills of his medications due to issues with his medicare coverage. He lost it he says. Patient has been having increased SOB and inconsistent use of controller medications.  Refill Albuterol, Dulera, and Spiriva. Return in two weeks for PFTs and smoking cessation.  Encounter for smoking cessation counseling  Counseled patient to stop smoking. Patient has low motivation to quit "cold [place]" but is willing to try cutting back.  Return in 2 weeks to discuss further smoking cessation strategies.   PATIENT EDUCATION PROVIDED: See AVS    Diagnosis and plan along with any newly prescribed medication(s) were discussed in detail with this patient today. The patient verbalized understanding and agreed with the plan. Patient advised if symptoms worsen return to clinic or ER.   Health Maintainance:   No orders of the defined types were placed in this encounter.   Meds ordered this encounter  Medications  . albuterol (PROAIR HFA) 108 (90 Base) MCG/ACT inhaler    Sig: inhale 1 to 2 puffs by mouth every 6 hours if needed for wheezing    Dispense:  8.5 g    Refill:  3    [place]  .  lisinopril (PRINIVIL,ZESTRIL) 5 MG tablet    Sig: Take 1 tablet (5 mg total) by mouth daily.    Dispense:  90 tablet    Refill:  0    [place]  . mometasone-formoterol (DULERA) 200-5 MCG/ACT AERO    Sig: Inhale 2 puffs into the lungs 2 (two) times daily.    Dispense:  1 Inhaler    Refill:  3    [place]  . tiotropium (SPIRIVA HANDIHALER) 18 MCG inhalation capsule    Sig: Place 1 capsule (18 mcg total) into inhaler and inhale daily.    Dispense:  60 capsule    Refill:  [place], [place] 11/17/2017, 9:15 AM PGY-2 [place]

## 2017-11-17 NOTE — Assessment & Plan Note (Signed)
Patient has not had access to refills of his medications due to issues with his medicare coverage. He lost it he says. Patient has been having increased SOB and inconsistent use of controller medications.  Refill Albuterol, Dulera, and Spiriva. Return in two weeks for PFTs and smoking cessation.

## 2017-11-18 MED FILL — SPIRIVA 18 MCG CP-HANDIHALE: 18 | 30 days supply | Qty: 30 | Fill #0

## 2017-11-18 MED FILL — DULERA 200 MCG/5 MCG INH: 200-5 | 30 days supply | Qty: 13 | Fill #0

## 2017-11-18 MED FILL — PROAIR HFA 90 MCG INHALER: 108 (90 BAS | 25 days supply | Qty: 9 | Fill #0

## 2017-11-18 MED FILL — LISINOPRIL 5 MG TABLET: 5 | 30 days supply | Qty: 30 | Fill #0

## 2017-11-19 ENCOUNTER — Ambulatory Visit: Payer: Self-pay

## 2017-11-26 ENCOUNTER — Ambulatory Visit: Payer: Self-pay

## 2017-11-27 ENCOUNTER — Ambulatory Visit: Payer: Self-pay | Admitting: Pharmacist

## 2018-02-06 ENCOUNTER — Encounter (HOSPITAL_COMMUNITY): Payer: Self-pay

## 2018-02-06 ENCOUNTER — Other Ambulatory Visit: Payer: Self-pay

## 2018-02-06 ENCOUNTER — Telehealth: Payer: Self-pay | Admitting: *Deleted

## 2018-02-06 ENCOUNTER — Emergency Department (HOSPITAL_COMMUNITY)
Admission: EM | Admit: 2018-02-06 | Discharge: 2018-02-06 | Disposition: A | Discharge status: Home / Self Care | Payer: Medicaid Other | Attending: Emergency Medicine | Admitting: Emergency Medicine

## 2018-02-06 ENCOUNTER — Emergency Department (HOSPITAL_COMMUNITY): Payer: Medicaid Other

## 2018-02-06 DIAGNOSIS — J441 Chronic obstructive pulmonary disease with (acute) exacerbation: Secondary | ICD-10-CM

## 2018-02-06 DIAGNOSIS — R05 Cough: Secondary | ICD-10-CM | POA: Diagnosis present

## 2018-02-06 DIAGNOSIS — Z79899 Other long term (current) drug therapy: Secondary | ICD-10-CM | POA: Diagnosis not present

## 2018-02-06 DIAGNOSIS — F17210 Nicotine dependence, cigarettes, uncomplicated: Secondary | ICD-10-CM | POA: Diagnosis not present

## 2018-02-06 DIAGNOSIS — I10 Essential (primary) hypertension: Secondary | ICD-10-CM | POA: Diagnosis not present

## 2018-02-06 LAB — CBC
HCT: 44.3 % (ref 39.0–52.0)
Hemoglobin: 13.9 g/dL (ref 13.0–17.0)
MCH: 28.8 pg (ref 26.0–34.0)
MCHC: 31.4 g/dL (ref 30.0–36.0)
MCV: 91.9 fL (ref 78.0–100.0)
PLATELETS: 290 10*3/uL (ref 150–400)
RBC: 4.82 MIL/uL (ref 4.22–5.81)
RDW: 13.2 % (ref 11.5–15.5)
WBC: 10.6 10*3/uL — AB (ref 4.0–10.5)

## 2018-02-06 LAB — BASIC METABOLIC PANEL
Anion gap: 11 (ref 5–15)
BUN: 12 mg/dL (ref 8–23)
CALCIUM: 9.9 mg/dL (ref 8.9–10.3)
CO2: 26 mmol/L (ref 22–32)
CREATININE: 0.92 mg/dL (ref 0.61–1.24)
Chloride: 103 mmol/L (ref 98–111)
Glucose, Bld: 106 mg/dL — ABNORMAL HIGH (ref 70–99)
Potassium: 4.1 mmol/L (ref 3.5–5.1)
SODIUM: 140 mmol/L (ref 135–145)

## 2018-02-06 MED ORDER — ALBUTEROL SULFATE HFA 108 (90 BASE) MCG/ACT IN AERS: INHALATION_SPRAY | RESPIRATORY_TRACT | 3 refills | Status: DC

## 2018-02-06 MED ORDER — PREDNISONE 20 MG PO TABS: ORAL_TABLET | ORAL | 0 refills | Status: DC

## 2018-02-06 MED ORDER — PREDNISONE 20 MG PO TABS
60.0000 mg | ORAL_TABLET | Freq: Once | ORAL | Status: AC
  Administered 2018-02-06: 60 mg via ORAL
  Filled 2018-02-06: qty 3

## 2018-02-06 MED ORDER — MOMETASONE FURO-FORMOTEROL FUM 200-5 MCG/ACT IN AERO: 2.0000 | INHALATION_SPRAY | Freq: Two times a day (BID) | RESPIRATORY_TRACT | 3 refills | Status: DC

## 2018-02-06 MED ORDER — ALBUTEROL SULFATE (2.5 MG/3ML) 0.083% IN NEBU
5.0000 mg | INHALATION_SOLUTION | Freq: Once | RESPIRATORY_TRACT | Status: AC
  Administered 2018-02-06: 5 mg via RESPIRATORY_TRACT
  Filled 2018-02-06: qty 6

## 2018-02-06 MED ORDER — IPRATROPIUM BROMIDE 0.02 % IN SOLN
0.5000 mg | Freq: Once | RESPIRATORY_TRACT | Status: AC
  Administered 2018-02-06: 0.5 mg via RESPIRATORY_TRACT
  Filled 2018-02-06: qty 2.5

## 2018-02-06 MED ORDER — ALBUTEROL SULFATE HFA 108 (90 BASE) MCG/ACT IN AERS
2.0000 | INHALATION_SPRAY | Freq: Once | RESPIRATORY_TRACT | Status: AC
  Administered 2018-02-06: 2 via RESPIRATORY_TRACT
  Filled 2018-02-06: qty 6.7

## 2018-02-06 MED FILL — DULERA 200 MCG/5 MCG INH: 200-5 | 30 days supply | Qty: 13 | Fill #1

## 2018-02-06 MED FILL — PROAIR HFA 90 MCG INHALER: 108 (90 BAS | 25 days supply | Qty: 9 | Fill #1

## 2018-02-06 MED FILL — predniSONE 20 MG TABS: 20 | 4 days supply | Qty: 8 | Fill #0

## 2018-02-06 NOTE — ED Notes (Signed)
Patient verbalizes understanding of discharge instructions. Opportunity for questioning and answers were provided. Armband removed by staff, pt discharged from ED.  

## 2018-02-06 NOTE — Telephone Encounter (Signed)
Pt walks in.  He was in the ED for a COPD exacerbation.  He was given 3 medications Proair, Dulera and prednisone.  Contacted outpatient pharmacy. They can provide the 3 medications for $1 from the indigent fund.  Pts scripts already had [facility] indigent fund written on them so advised to take the script down to outpatient pharmacy.    Pt very grateful. [place], Salome Spotted, CMA

## 2018-02-06 NOTE — ED Triage Notes (Signed)
Pt presents for evaluation of COPD exacerbation. Pt reports has run out of inhaler "recently" and has gotten worse with productive cough and SOB.

## 2018-02-06 NOTE — ED Notes (Signed)
Pt back from imaging

## 2018-02-06 NOTE — ED Notes (Addendum)
Results reviewed.  No changes in acuity at this time.  Patient up to desk, states he is going outside

## 2018-02-06 NOTE — ED Notes (Signed)
Patient transported to X-ray 

## 2018-02-06 NOTE — ED Notes (Signed)
Called pt x3 for vitals. No answer. May have went to get food.

## 2018-02-06 NOTE — ED Provider Notes (Signed)
[facility] EMERGENCY DEPARTMENT Provider Note   CSN: [id number] Arrival date & time: 02/06/18  0759     History   Chief Complaint Chief Complaint  Patient presents with  . COPD    HPI Gavin Kane is a 62 y.o. male.  HPI  62 year old male with a history of COPD presents with dyspnea and cough.  Started having cough with yellow sputum and chest congestion about 4 days ago.  No fevers, chest pain, sore throat, leg swelling or any other symptoms.  He has run out of all of his inhalers.  Patient was given a breathing treatment prior to me seeing him and states he feels a little bit better but is still short of breath.  He is been having a difficult time filling his meds due to Medicaid cutting them off.  Past Medical History:  Diagnosis Date  . COPD (chronic obstructive pulmonary disease) ([place])   . HTN (hypertension)     Patient Active Problem List   Diagnosis Date Noted  . Encounter for smoking cessation counseling 11/17/2017  . Non compliance w medication regimen 04/19/2016  . Benzodiazepine abuse ([place]) 12/28/2015  . Anxiety state 05/18/2014  . Chronic fatigue 12/01/2012  . Tobacco abuse counseling 11/27/2012  . COPD (chronic obstructive pulmonary disease) ([place]) 10/13/2012  . DOE (dyspnea on exertion) 10/11/2012  . Nonspecific ST-T changes 10/11/2012  . Hypertension 10/11/2012    Past Surgical History:  Procedure Laterality Date  . HEMORRHOID SURGERY          Home Medications    Prior to Admission medications   Medication Sig Start Date End Date Taking? Authorizing Provider  albuterol (PROAIR HFA) 108 (90 Base) MCG/ACT inhaler inhale 1 to 2 puffs by mouth every 6 hours if needed for wheezing 02/06/18   Sherwood Gambler, MD  ibuprofen (ADVIL,MOTRIN) 200 MG tablet Take 200 mg by mouth as needed for headache or moderate pain.    [provider]  lisinopril (PRINIVIL,ZESTRIL) 5 MG tablet Take 1 tablet (5 mg total) by mouth daily. 11/17/17    Lockamy, Christia Reading, DO  mometasone-formoterol (DULERA) 200-5 MCG/ACT AERO Inhale 2 puffs into the lungs 2 (two) times daily. 02/06/18   Sherwood Gambler, MD  predniSONE (DELTASONE) 20 MG tablet 2 tabs po daily x 4 days 02/07/18   Sherwood Gambler, MD  tiotropium (SPIRIVA HANDIHALER) 18 MCG inhalation capsule Place 1 capsule (18 mcg total) into inhaler and inhale daily. 11/17/17   Nuala Alpha, DO  venlafaxine XR (EFFEXOR-XR) 75 MG 24 hr capsule Take 1 capsule (75 mg total) by mouth daily. 08/05/16   McKeag, Marylynn Pearson, MD    Family History Family History  Problem Relation Age of Onset  . Cancer Unknown        Aunt    Social History Social History   Tobacco Use  . Smoking status: Current Some Day Smoker    Packs/day: 0.75    Years: 40.00    Pack years: 30.00    Types: Cigarettes  . Smokeless tobacco: Former Systems developer    Quit date: 07/21/2012  Substance Use Topics  . Alcohol use: Yes    Alcohol/week: 0.0 standard drinks    Comment: Social  . Drug use: No     Allergies   Patient has no known allergies.   Review of Systems Review of Systems  Constitutional: Negative for fever.  HENT: Positive for congestion. Negative for sore throat.   Respiratory: Positive for cough and shortness of breath.   Cardiovascular: Negative  for chest pain and leg swelling.  All other systems reviewed and are negative.    Physical Exam Updated Vital Signs BP 135/83   Pulse 96   Temp 98.5 F (36.9 C) (Oral)   Resp (!) 25   Ht 5\' 5"  (1.651 m)   Wt 63.5 kg   SpO2 93%   BMI 23.30 kg/m   Physical Exam  Constitutional: He appears well-developed and well-nourished. No distress.  HENT:  Head: Normocephalic and atraumatic.  Right Ear: External ear normal.  Left Ear: External ear normal.  Nose: Nose normal.  Eyes: Right eye exhibits no discharge. Left eye exhibits no discharge.  Neck: Neck supple.  Cardiovascular: Regular rhythm and normal heart sounds. Tachycardia present.  HR 90s, low 100s    Pulmonary/Chest: Effort normal. Tachypnea noted. He has decreased breath sounds. He has wheezes.  Abdominal: Soft. There is no tenderness.  Musculoskeletal: He exhibits no edema.  Neurological: He is alert.  Skin: Skin is warm and dry. He is not diaphoretic.  Psychiatric: He has a normal mood and affect.  Nursing note and vitals reviewed.    ED Treatments / Results  Labs (all labs ordered are listed, but only abnormal results are displayed) Labs Reviewed  BASIC METABOLIC PANEL - Abnormal; Notable for the following components:      Result Value   Glucose, Bld 106 (*)    All other components within normal limits  CBC - Abnormal; Notable for the following components:   WBC 10.6 (*)    All other components within normal limits    EKG EKG Interpretation  Date/Time:  Friday February 06 2018 08:03:52 EDT Ventricular Rate:  86 PR Interval:  152 QRS Duration: 70 QT Interval:  340 QTC Calculation: 406 R Axis:   70 Text Interpretation:  Normal sinus rhythm Biatrial enlargement Nonspecific ST and T wave abnormality Abnormal ECG ST/T changes similar to Jun 2019 Confirmed by Sherwood Gambler [phone]) on 02/06/2018 12:23:19 PM   Radiology Dg Chest 2 View  Result Date: 02/06/2018 CLINICAL DATA:  Cough and dyspnea.  History of COPD. EXAM: CHEST - 2 VIEW COMPARISON:  10/07/2017 FINDINGS: The cardiomediastinal silhouette is unchanged and within normal limits. The lungs are mildly hyperinflated. No airspace consolidation, edema, pleural effusion, pneumothorax is identified. There is slight thoracic levoscoliosis. IMPRESSION: No active cardiopulmonary disease. Electronically Signed   By: Logan Bores M.D.   On: 02/06/2018 13:14    Procedures Procedures (including critical care time)  Medications Ordered in ED Medications  albuterol (PROVENTIL HFA;VENTOLIN HFA) 108 (90 Base) MCG/ACT inhaler 2 puff (has no administration in time range)  albuterol (PROVENTIL) (2.5 MG/3ML) 0.083% nebulizer  solution 5 mg (5 mg Nebulization Given 02/06/18 0811)  predniSONE (DELTASONE) tablet 60 mg (60 mg Oral Given 02/06/18 1244)  albuterol (PROVENTIL) (2.5 MG/3ML) 0.083% nebulizer solution 5 mg (5 mg Nebulization Given 02/06/18 1244)  ipratropium (ATROVENT) nebulizer solution 0.5 mg (0.5 mg Nebulization Given 02/06/18 1244)     Initial Impression / Assessment and Plan / ED Course  I have reviewed the triage vital signs and the nursing notes.  Pertinent labs & imaging results that were available during my care of the patient were reviewed by me and considered in my medical decision making (see chart for details).     Patient is feeling much better after albuterol on Atrovent here.  He was given prednisone.  No signs of pneumonia.  My suspicion is this is a recurrent COPD exacerbation but I doubt an acute bacterial source.  I do not think antibiotics are warranted.  He is out of his [name] and his albuterol rescue inhaler and these will be represcribed for him.  He was given an inhaler here.  I think he is stable for discharge home and highly doubt ACS, PE, pneumonia, etc.  Discussed importance of quit smoking.  Final Clinical Impressions(s) / ED Diagnoses   Final diagnoses:  COPD exacerbation [facility]    ED Discharge Orders         Ordered    predniSONE (DELTASONE) 20 MG tablet     02/06/18 1442    albuterol (PROAIR HFA) 108 (90 Base) MCG/ACT inhaler    Note to Pharmacy:  [place]   02/06/18 1442    mometasone-formoterol (DULERA) 200-5 MCG/ACT AERO  2 times daily    Note to Pharmacy:  [place]   02/06/18 [place], Haden Suder, MD 02/06/18 1444

## 2018-02-06 NOTE — ED Notes (Signed)
Pt wanted to walk back from waiting room

## 2018-02-06 NOTE — Discharge Instructions (Addendum)
Use your albuterol inhaler every 4 hours as needed for shortness of breath.  If you are needing it much more than that, return to the ER for evaluation.  If you develop worsening shortness of breath, high fever, vomiting, chest pain, or any other new/concerning symptoms and come back to the ER or see your family doctor.  You were given the first dose of prednisone today, start the prescription dose tomorrow.  It is very important to stop smoking as this is contributing to your disease and also will make your COPD worse.

## 2018-02-09 ENCOUNTER — Encounter: Payer: Self-pay | Admitting: Family Medicine

## 2018-02-09 ENCOUNTER — Ambulatory Visit (INDEPENDENT_AMBULATORY_CARE_PROVIDER_SITE_OTHER): Payer: Self-pay | Admitting: Family Medicine

## 2018-02-09 VITALS — BP 125/85 | HR 91 | Temp 98.5°F | Wt 142.6 lb

## 2018-02-09 DIAGNOSIS — R059 Cough, unspecified: Secondary | ICD-10-CM | POA: Insufficient documentation

## 2018-02-09 DIAGNOSIS — R05 Cough: Secondary | ICD-10-CM

## 2018-02-09 DIAGNOSIS — J432 Centrilobular emphysema: Secondary | ICD-10-CM

## 2018-02-09 MED ORDER — GUAIFENESIN 100 MG/5ML PO SYRP: 200.0000 mg | ORAL_SOLUTION | Freq: Three times a day (TID) | ORAL | 0 refills | Status: DC | PRN

## 2018-02-09 MED ORDER — LISINOPRIL 5 MG PO TABS: 5.0000 mg | ORAL_TABLET | Freq: Every day | ORAL | 0 refills | Status: DC

## 2018-02-09 MED FILL — LISINOPRIL 5 MG TABLET: 5 | 30 days supply | Qty: 30 | Fill #0

## 2018-02-09 NOTE — Assessment & Plan Note (Signed)
Patient presents today with continued productive cough in the setting of recent ED visit for COPD exacerbation.  Patient has been nonadherent to his inhaler regimen prior to ED visit.  Since hospital visit patient has resumed Dulera as well as Spiriva.  He also just completed a 4-day course of prednisone.  Patient is a rest have been improve but he still experiencing cough that is productive in nature.  Patient continues to smoke about 4 cigarettes a day.  Suspect cough is likely secondary to worsening lung function in the setting of continue smoking and centrilobular emphysema.  Recommend continuation of current inhaler regimen.  Will prescribe guaifenesin to help with cough.  Discussed smoking cessation in the setting of COPD.  To prevent worsening lung function.  Patient verbalized understanding.  He will follow-up on as-needed basis.

## 2018-02-09 NOTE — Progress Notes (Signed)
   Subjective:    Patient ID: Gavin Kane, male    DOB: June 17, 1955, 61 y.o.   MRN: [id number]   CC: Follow-up for recent ED visit for COPD exacerbation  HPI: Patient is a 62 year old male with past medical history significant for COPD with here today to follow-up on recent ED visit for COPD exacerbation.  Patient reports that he was prescribed prednisone 40 mg daily for 4 days.  He reports that shortness of breath has improved, but continued to have a productive cough.  Patient is currently taking Dulera as well as Spiriva as prescribed.  Patient had run out of his inhalers.  He was prescribed the ED [facility] and albuterol Pro Air.  Patient is still smoking about 4 cigarettes a day.  Patient with this to discuss cough accompanied with phlegm.  He currently has no shortness of breath, chest pain, dizziness, headache vision change.  Smoking status reviewed   ROS: all other systems were reviewed and are negative other than in the HPI   Past Medical History:  Diagnosis Date  . COPD (chronic obstructive pulmonary disease) ([place])   . HTN (hypertension)     Past Surgical History:  Procedure Laterality Date  . HEMORRHOID SURGERY      Past medical history, surgical, family, and social history reviewed and updated in the EMR as appropriate.  Objective:  BP 125/85   Pulse 91   Temp 98.5 F (36.9 C) (Oral)   Wt 142 lb 9.6 oz (64.7 kg)   SpO2 96%   BMI 23.73 kg/m   Vitals and nursing note reviewed  General: NAD, pleasant, able to participate in exam Cardiac: RRR, normal heart sounds, no murmurs. 2+ radial and PT pulses bilaterally Respiratory: Mild wheezing, poor air movement, no increased work of breathing  Abdomen: soft, nontender, nondistended, no hepatic or splenomegaly, +BS Extremities: no edema or cyanosis. WWP. Skin: warm and dry, no rashes noted Neuro: alert and oriented x4, no focal deficits Psych: Normal affect and mood   Assessment & Plan:   Cough Patient presents today  with continued productive cough in the setting of recent ED visit for COPD exacerbation.  Patient has been nonadherent to his inhaler regimen prior to ED visit.  Since hospital visit patient has resumed Dulera as well as Spiriva.  He also just completed a 4-day course of prednisone.  Patient is a rest have been improve but he still experiencing cough that is productive in nature.  Patient continues to smoke about 4 cigarettes a day.  Suspect cough is likely secondary to worsening lung function in the setting of continue smoking and centrilobular emphysema.  Recommend continuation of current inhaler regimen.  Will prescribe guaifenesin to help with cough.  Discussed smoking cessation in the setting of COPD.  To prevent worsening lung function.  Patient verbalized understanding.  He will follow-up on as-needed basis.    Marjie Skiff, MD [place] PGY-3

## 2018-03-12 ENCOUNTER — Encounter: Payer: Self-pay | Admitting: Family Medicine

## 2018-03-12 ENCOUNTER — Ambulatory Visit (INDEPENDENT_AMBULATORY_CARE_PROVIDER_SITE_OTHER): Payer: Self-pay | Admitting: Family Medicine

## 2018-03-12 VITALS — BP 130/85 | HR 87 | Temp 98.3°F | Wt 142.6 lb

## 2018-03-12 DIAGNOSIS — Z23 Encounter for immunization: Secondary | ICD-10-CM

## 2018-03-12 DIAGNOSIS — J432 Centrilobular emphysema: Secondary | ICD-10-CM

## 2018-03-12 DIAGNOSIS — F411 Generalized anxiety disorder: Secondary | ICD-10-CM

## 2018-03-12 MED ORDER — TIOTROPIUM BROMIDE MONOHYDRATE 18 MCG IN CAPS: 18.0000 ug | ORAL_CAPSULE | Freq: Every day | RESPIRATORY_TRACT | 0 refills | Status: DC

## 2018-03-12 MED ORDER — LISINOPRIL 5 MG PO TABS: 5.0000 mg | ORAL_TABLET | Freq: Every day | ORAL | 0 refills | Status: DC

## 2018-03-12 MED ORDER — SERTRALINE HCL 25 MG PO TABS
25.0000 mg | ORAL_TABLET | Freq: Every day | ORAL | 0 refills | Status: DC
Start: 2018-03-12 — End: 2018-03-12

## 2018-03-12 MED ORDER — MOMETASONE FURO-FORMOTEROL FUM 200-5 MCG/ACT IN AERO: 2.0000 | INHALATION_SPRAY | Freq: Two times a day (BID) | RESPIRATORY_TRACT | 3 refills | Status: DC

## 2018-03-12 MED ORDER — ALBUTEROL SULFATE HFA 108 (90 BASE) MCG/ACT IN AERS: INHALATION_SPRAY | RESPIRATORY_TRACT | 3 refills | Status: DC

## 2018-03-12 MED ORDER — SERTRALINE HCL 25 MG PO TABS
25.0000 mg | ORAL_TABLET | Freq: Every day | ORAL | 0 refills | Status: DC
Start: 2018-03-12 — End: 2018-06-21

## 2018-03-12 MED FILL — DULERA 200 MCG/5 MCG INH: 200-5 | 30 days supply | Qty: 13 | Fill #0

## 2018-03-12 MED FILL — SPIRIVA 18 MCG CP-HANDIHALE: 18 | 30 days supply | Qty: 30 | Fill #0

## 2018-03-12 MED FILL — SERTRALINE HCL 25 MG TABLET: 25 | 30 days supply | Qty: 60 | Fill #0

## 2018-03-12 MED FILL — LISINOPRIL 5 MG TABLET: 5 | 30 days supply | Qty: 30 | Fill #0

## 2018-03-12 MED FILL — PROAIR HFA 90 MCG INHALER: 108 (90 BAS | 25 days supply | Qty: 9 | Fill #0

## 2018-03-12 NOTE — Assessment & Plan Note (Signed)
Stopped Effexor as patient was not taking stating he did not think it was helping. He was on Xanax in the past but has been weaned off and I reiterated to patient today this would not be a drug that would help him and I will not prescribe it for him. - Start Sertraline 25mg  daily for one week then increase to 50mg  for 3 weeks. - Follow up in 1 month.

## 2018-03-12 NOTE — Patient Instructions (Signed)
It was great to see you today! Thank you for letting me participate in your care!  Today, we discussed your COPD which is well controlled on the medications you are currently on. It is important NOT to run out of your inhaler medications. Please be sure to call the pharmacy so they can request refills BEFORE you run out as you need these every day.   For your anxiety I have started you on a medication called Sertraline also known as Zolofot. It will help with your anxiety. Please take one pill for 7 days then increase to two pills per day. I will follow up with you in one month. Your uncontrolled anxiety may also be contributing to your shortness of breath at times.   Be well, Harolyn Rutherford, DO PGY-2, [facility]

## 2018-03-12 NOTE — Assessment & Plan Note (Signed)
Patient did not have follow up PFTs in epic that I can see. Overall, he seems to be improved from his acute exacerbation and his breathing today is better but not back to normal due to him running out of his medications.  - Refilled Dulera, Spiriva, and Albuterol. Will consider changes as needed based on PFTs - PFTs in one month when he returns for follow up

## 2018-03-12 NOTE — Progress Notes (Signed)
Subjective: Chief Complaint  Patient presents with  . breathing issues  . Medication Refill   HPI: Gavin Kane is a 62 y.o. presenting to clinic today to discuss the following:  COPD Exacerbation Follow Up Patient states he is "about the same" as when he came in about one month ago but does seem a little better. He has been taking Dulera (ICS + LABA) and Spiriva (LAMA) daily. He has run out of of [place] and Spiriva. He states when he took his medications along with prednisone his breathing was much improved. He was treated about one month ago for an acute exacerbation of COPD. He denies chest pain, orthopnea, and PND.  Anxiety Patient states he has "panic attacks" and was previously given Xanax that helped. He had been taken of Xanax and started on Effexor but states that did not help him at all so he quit taking it "some time ago". Patient will have these attacks that to him are unprovoked and occur at home or at various places at random times. He also states it does not seem to be triggered by anything in particular.   Health Maintenance: influenza vaccine today, colonoscopy paperwork given     ROS noted in HPI.   Past Medical, Surgical, Social, and Family History Reviewed & Updated per EMR.   Pertinent Historical Findings include:   Social History   Tobacco Use  Smoking Status Current Some Day Smoker  . Packs/day: 0.75  . Years: 40.00  . Pack years: 30.00  . Types: Cigarettes  Smokeless Tobacco Former Systems developer  . Quit date: 07/21/2012    Objective: BP 130/85   Pulse 87   Temp 98.3 F (36.8 C) (Oral)   Wt 142 lb 9.6 oz (64.7 kg)   SpO2 97%   BMI 23.73 kg/m  Vitals and nursing notes reviewed  Physical Exam Gen: Alert and Oriented x 3, NAD HEENT: Normocephalic, atraumatic CV: RRR, no murmurs, normal S1, S2 split Resp: CTAB, mild bibasilar wheezing, no rales, or rhonchi, comfortable work of breathing Ext: no clubbing, cyanosis, or edema Neuro: No gross  deficits Skin: warm, dry, intact, no rashes  No results found for this or any previous visit (from the past 72 hour(s)).  Assessment/Plan:  Anxiety state Stopped Effexor as patient was not taking stating he did not think it was helping. He was on Xanax in the past but has been weaned off and I reiterated to patient today this would not be a drug that would help him and I will not prescribe it for him. - Start Sertraline 25mg  daily for one week then increase to 50mg  for 3 weeks. - Follow up in 1 month.  COPD (chronic obstructive pulmonary disease) Patient did not have follow up PFTs in epic that I can see. Overall, he seems to be improved from his acute exacerbation and his breathing today is better but not back to normal due to him running out of his medications.  - Refilled Dulera, Spiriva, and Albuterol. Will consider changes as needed based on PFTs - PFTs in one month when he returns for follow up    [place]: See AVS    Diagnosis and plan along with any newly prescribed medication(s) were discussed in detail with this patient today. The patient verbalized understanding and agreed with the plan. Patient advised if symptoms worsen return to clinic or ER.   Health Maintainance:   Orders Placed This Encounter  Procedures  . Flu Vaccine QUAD 36+ mos  IM    Meds ordered this encounter  Medications  . DISCONTD: sertraline (ZOLOFT) 25 MG tablet    Sig: Take 1 tablet (25 mg total) by mouth at bedtime. Please take one tablet daily for the first 7 days. Then take two tablets daily.    Dispense:  60 tablet    Refill:  0  . albuterol (PROAIR HFA) 108 (90 Base) MCG/ACT inhaler    Sig: inhale 1 to 2 puffs by mouth every 6 hours if needed for wheezing    Dispense:  8.5 g    Refill:  3    [place]  . lisinopril (PRINIVIL,ZESTRIL) 5 MG tablet    Sig: Take 1 tablet (5 mg total) by mouth daily.    Dispense:  90 tablet    Refill:  0    [place]  .  mometasone-formoterol (DULERA) 200-5 MCG/ACT AERO    Sig: Inhale 2 puffs into the lungs 2 (two) times daily.    Dispense:  1 Inhaler    Refill:  3    [place]  . tiotropium (SPIRIVA HANDIHALER) 18 MCG inhalation capsule    Sig: Place 1 capsule (18 mcg total) into inhaler and inhale daily.    Dispense:  60 capsule    Refill:  0    [place]  . sertraline (ZOLOFT) 25 MG tablet    Sig: Take 1 tablet (25 mg total) by mouth at bedtime. Please take one tablet daily for the first 7 days. Then take two tablets daily.    Dispense:  60 tablet    Refill:  0    [place], [place] 03/12/2018, 1:35 PM PGY-2 [place]

## 2018-04-13 MED FILL — DULERA 200 MCG/5 MCG INH: 200-5 | 30 days supply | Qty: 13 | Fill #1

## 2018-04-13 MED FILL — PROAIR HFA 90 MCG INHALER: 108 (90 BAS | 25 days supply | Qty: 9 | Fill #1

## 2018-05-04 MED FILL — DULERA 200 MCG/5 MCG INH: 200-5 | 30 days supply | Qty: 13 | Fill #2

## 2018-05-04 MED FILL — PROAIR HFA 90 MCG INHALER: 108 (90 BAS | 25 days supply | Qty: 9 | Fill #2

## 2018-05-25 MED FILL — LISINOPRIL 5 MG TABLET: 5 | 30 days supply | Qty: 30 | Fill #1

## 2018-05-25 MED FILL — PROAIR HFA 90 MCG INHALER: 108 (90 BAS | 25 days supply | Qty: 9 | Fill #3

## 2018-05-25 MED FILL — DULERA 200 MCG/5 MCG INH: 200-5 | 30 days supply | Qty: 13 | Fill #3

## 2018-06-11 MED FILL — PROAIR HFA 90 MCG INHALER: 108 (90 BAS | 25 days supply | Qty: 9 | Fill #0

## 2018-06-11 MED FILL — DULERA 200 MCG/5 MCG INH: 200-5 | 30 days supply | Qty: 13 | Fill #0

## 2018-06-14 ENCOUNTER — Encounter (HOSPITAL_COMMUNITY): Payer: Self-pay

## 2018-06-14 ENCOUNTER — Other Ambulatory Visit: Payer: Self-pay

## 2018-06-14 ENCOUNTER — Emergency Department (HOSPITAL_COMMUNITY): Payer: Medicaid Other

## 2018-06-14 ENCOUNTER — Emergency Department (HOSPITAL_COMMUNITY)
Admission: EM | Admit: 2018-06-14 | Discharge: 2018-06-14 | Disposition: A | Discharge status: Home / Self Care | Payer: Medicaid Other | Attending: Emergency Medicine | Admitting: Emergency Medicine

## 2018-06-14 DIAGNOSIS — R05 Cough: Secondary | ICD-10-CM

## 2018-06-14 DIAGNOSIS — I10 Essential (primary) hypertension: Secondary | ICD-10-CM | POA: Diagnosis not present

## 2018-06-14 DIAGNOSIS — J441 Chronic obstructive pulmonary disease with (acute) exacerbation: Secondary | ICD-10-CM | POA: Diagnosis not present

## 2018-06-14 DIAGNOSIS — Z720 Tobacco use: Secondary | ICD-10-CM

## 2018-06-14 DIAGNOSIS — R0602 Shortness of breath: Secondary | ICD-10-CM | POA: Diagnosis present

## 2018-06-14 DIAGNOSIS — Z79899 Other long term (current) drug therapy: Secondary | ICD-10-CM | POA: Diagnosis not present

## 2018-06-14 DIAGNOSIS — F17210 Nicotine dependence, cigarettes, uncomplicated: Secondary | ICD-10-CM | POA: Insufficient documentation

## 2018-06-14 DIAGNOSIS — R059 Cough, unspecified: Secondary | ICD-10-CM

## 2018-06-14 MED ORDER — ALBUTEROL SULFATE HFA 108 (90 BASE) MCG/ACT IN AERS
2.0000 | INHALATION_SPRAY | Freq: Once | RESPIRATORY_TRACT | Status: AC
  Administered 2018-06-14: 2 via RESPIRATORY_TRACT
  Filled 2018-06-14: qty 6.7

## 2018-06-14 MED ORDER — PREDNISONE 20 MG PO TABS: ORAL_TABLET | ORAL | 0 refills | Status: DC

## 2018-06-14 MED ORDER — ALBUTEROL SULFATE HFA 108 (90 BASE) MCG/ACT IN AERS: 1.0000 | INHALATION_SPRAY | RESPIRATORY_TRACT | 0 refills | Status: DC | PRN

## 2018-06-14 MED ORDER — AZITHROMYCIN 250 MG PO TABS: ORAL_TABLET | ORAL | 0 refills | Status: DC

## 2018-06-14 MED ORDER — PREDNISONE 20 MG PO TABS
60.0000 mg | ORAL_TABLET | Freq: Once | ORAL | Status: AC
  Administered 2018-06-14: 60 mg via ORAL
  Filled 2018-06-14: qty 3

## 2018-06-14 MED ORDER — IPRATROPIUM BROMIDE 0.02 % IN SOLN
0.5000 mg | Freq: Once | RESPIRATORY_TRACT | Status: AC
  Administered 2018-06-14: 0.5 mg via RESPIRATORY_TRACT
  Filled 2018-06-14: qty 2.5

## 2018-06-14 MED ORDER — ALBUTEROL SULFATE (2.5 MG/3ML) 0.083% IN NEBU
5.0000 mg | INHALATION_SOLUTION | Freq: Once | RESPIRATORY_TRACT | Status: AC
  Administered 2018-06-14: 5 mg via RESPIRATORY_TRACT
  Filled 2018-06-14: qty 6

## 2018-06-14 NOTE — Discharge Instructions (Signed)
Continue to stay well-hydrated. Use Mucinex for cough suppression/expectoration of mucus. Use over the counter antihistamines such as zyrtec, claritin, or allegra to decrease symptoms. Use inhaler as directed, as needed for cough/chest congestion/wheezing/shortness of breath/etc. Take prednisone as directed for your asthma exacerbation, starting tomorrow since you received today's dose in the ER today. STOP SMOKING! Follow-up with your primary care doctor in 5-7 days for recheck of ongoing symptoms. Return to emergency department for emergent changing or worsening of symptoms.

## 2018-06-14 NOTE — ED Provider Notes (Signed)
[facility] DEPT Provider Note   CSN: [id number] Arrival date & time: 06/14/18  1223     History   Chief Complaint Chief Complaint  Patient presents with  . URI    HPI Gavin Kane is a 63 y.o. male with a PMHx of COPD and HTN, who presents to the ED with complaints of cough and chest cold symptoms for the last 1.5 weeks.  Patient reports cough with yellow-white sputum production, wheezing, shortness of breath, and rhinorrhea.  Symptoms are moderate and constant.  He has tried Tussin cough syrup and TheraFlu without relief.  No known aggravating factors.  He endorses being a cigarette smoker.  No known sick contacts.  He did receive his flu shot this year.  He denies having any fevers, chills, sore throat, ear pain or drainage, chest pain, leg swelling, recent travel/surgery/immobilization, personal or family history of DVT/PE, abdominal pain, nausea, vomiting, diarrhea, constipation, dysuria, hematuria, myalgias, arthralgias, numbness, tingling, focal weakness, or any other complaints at this time.  The history is provided by the patient and medical records. No language interpreter was used.  URI  Presenting symptoms: cough and rhinorrhea   Presenting symptoms: no ear pain, no fever and no sore throat   Associated symptoms: wheezing   Associated symptoms: no arthralgias and no myalgias     Past Medical History:  Diagnosis Date  . COPD (chronic obstructive pulmonary disease) ([place])   . HTN (hypertension)     Patient Active Problem List   Diagnosis Date Noted  . Cough 02/09/2018  . Encounter for smoking cessation counseling 11/17/2017  . Non compliance w medication regimen 04/19/2016  . Benzodiazepine abuse ([place]) 12/28/2015  . Anxiety state 05/18/2014  . Chronic fatigue 12/01/2012  . Tobacco abuse counseling 11/27/2012  . COPD (chronic obstructive pulmonary disease) ([place]) 10/13/2012  . DOE (dyspnea on exertion) 10/11/2012  . Nonspecific ST-T  changes 10/11/2012  . Hypertension 10/11/2012    Past Surgical History:  Procedure Laterality Date  . HEMORRHOID SURGERY          Home Medications    Prior to Admission medications   Medication Sig Start Date End Date Taking? Authorizing Provider  albuterol (PROAIR HFA) 108 (90 Base) MCG/ACT inhaler inhale 1 to 2 puffs by mouth every 6 hours if needed for wheezing 03/12/18   Nuala Alpha, DO  guaifenesin (ALTARUSSIN) 100 MG/5ML syrup Take 10 mLs (200 mg total) by mouth 3 (three) times daily as needed for cough. 02/09/18   Diallo, Earna Coder, MD  lisinopril (PRINIVIL,ZESTRIL) 5 MG tablet Take 1 tablet (5 mg total) by mouth daily. 03/12/18   Lockamy, Christia Reading, DO  mometasone-formoterol (DULERA) 200-5 MCG/ACT AERO Inhale 2 puffs into the lungs 2 (two) times daily. 03/12/18   Nuala Alpha, DO  predniSONE (DELTASONE) 20 MG tablet 2 tabs po daily x 4 days 02/07/18   Sherwood Gambler, MD  sertraline (ZOLOFT) 25 MG tablet Take 1 tablet (25 mg total) by mouth at bedtime. Please take one tablet daily for the first 7 days. Then take two tablets daily. 03/12/18   Lockamy, Christia Reading, DO  tiotropium (SPIRIVA HANDIHALER) 18 MCG inhalation capsule Place 1 capsule (18 mcg total) into inhaler and inhale daily. 03/12/18   Nuala Alpha, DO    Family History Family History  Problem Relation Age of Onset  . Cancer Other        Aunt    Social History Social History   Tobacco Use  . Smoking status: Current Some Day Smoker  Packs/day: 0.75    Years: 40.00    Pack years: 30.00    Types: Cigarettes  . Smokeless tobacco: Former Systems developer    Quit date: 07/21/2012  Substance Use Topics  . Alcohol use: Yes    Alcohol/week: 0.0 standard drinks    Comment: Social  . Drug use: No     Allergies   Patient has no known allergies.   Review of Systems Review of Systems  Constitutional: Negative for chills and fever.  HENT: Positive for rhinorrhea. Negative for ear discharge, ear pain and sore throat.    Respiratory: Positive for cough, shortness of breath and wheezing.   Cardiovascular: Negative for chest pain and leg swelling.  Gastrointestinal: Negative for abdominal pain, constipation, diarrhea, nausea and vomiting.  Genitourinary: Negative for dysuria and hematuria.  Musculoskeletal: Negative for arthralgias and myalgias.  Skin: Negative for color change.  Allergic/Immunologic: Negative for immunocompromised state.  Neurological: Negative for weakness and numbness.  Psychiatric/Behavioral: Negative for confusion.   All other systems reviewed and are negative for acute change except as noted in the HPI.    Physical Exam Updated Vital Signs BP (!) 148/101 (BP Location: Right Arm)   Pulse 100   Temp 98.4 F (36.9 C) (Oral)   Resp (!) 22   Ht 5\' 5"  (1.651 m)   Wt 63.5 kg   SpO2 100%   BMI 23.30 kg/m    Physical Exam Vitals signs and nursing note reviewed.  Constitutional:      General: He is not in acute distress.    Appearance: Normal appearance. He is well-developed. He is not toxic-appearing.     Comments: Afebrile, nontoxic, NAD  HENT:     Head: Normocephalic and atraumatic.  Eyes:     General:        Right eye: No discharge.        Left eye: No discharge.     Conjunctiva/sclera: Conjunctivae normal.  Neck:     Musculoskeletal: Normal range of motion and neck supple.  Cardiovascular:     Rate and Rhythm: Normal rate and regular rhythm.     Pulses: Normal pulses.     Heart sounds: Normal heart sounds, S1 normal and S2 normal. No murmur. No friction rub. No gallop.      Comments: HR upper 90s during exam, no tachycardia during exam Pulmonary:     Effort: Pulmonary effort is normal. No tachypnea or respiratory distress.     Breath sounds: Decreased breath sounds and wheezing present. No rhonchi or rales.     Comments: No tachypnea during exam. Diminished breath sounds throughout with faint expiratory wheezing throughout, no rhonchi/rales, no hypoxia or increased  WOB, speaking in full sentences, SpO2 100% on RA  Abdominal:     General: Bowel sounds are normal. There is no distension.     Palpations: Abdomen is soft. Abdomen is not rigid.     Tenderness: There is no abdominal tenderness. There is no right CVA tenderness, left CVA tenderness, guarding or rebound. Negative signs include Murphy's sign and McBurney's sign.  Musculoskeletal: Normal range of motion.  Skin:    General: Skin is warm and dry.     Findings: No rash.  Neurological:     Mental Status: He is alert and oriented to person, place, and time.     Sensory: Sensation is intact. No sensory deficit.     Motor: Motor function is intact.  Psychiatric:        Mood and Affect: Mood and  affect normal.        Behavior: Behavior normal.      ED Treatments / Results  Labs (all labs ordered are listed, but only abnormal results are displayed) Labs Reviewed - No data to display  EKG None  Radiology Dg Chest 2 View  Result Date: 06/14/2018 CLINICAL DATA:  63 year old male with history of chest congestion and cough. EXAM: CHEST - 2 VIEW COMPARISON:  Chest x-ray 02/06/2018. FINDINGS: Lung volumes are normal. No consolidative airspace disease. No pleural effusions. No pneumothorax. No pulmonary nodule or mass noted. Pulmonary vasculature and the cardiomediastinal silhouette are within normal limits. IMPRESSION: No radiographic evidence of acute cardiopulmonary disease. Electronically Signed   By: Vinnie Langton M.D.   On: 06/14/2018 13:18    Procedures Procedures (including critical care time)  Medications Ordered in ED Medications  predniSONE (DELTASONE) tablet 60 mg (60 mg Oral Given 06/14/18 1517)  albuterol (PROVENTIL) (2.5 MG/3ML) 0.083% nebulizer solution 5 mg (5 mg Nebulization Given 06/14/18 1533)  ipratropium (ATROVENT) nebulizer solution 0.5 mg (0.5 mg Nebulization Given 06/14/18 1534)  albuterol (PROVENTIL HFA;VENTOLIN HFA) 108 (90 Base) MCG/ACT inhaler 2 puff (2 puffs Inhalation  Given 06/14/18 1623)     Initial Impression / Assessment and Plan / ED Course  I have reviewed the triage vital signs and the nursing notes.  Pertinent labs & imaging results that were available during my care of the patient were reviewed by me and considered in my medical decision making (see chart for details).     63 y.o. male here with cough, wheezing, shortness of breath, rhinorrhea for the last 1.5 weeks.  On exam, diminished lung sounds throughout, faint expiratory wheezing throughout, no rhonchi or rales.  No tachycardia during exam, no tachypnea or increased work of breathing, overall fairly well-appearing.  Suspect COPD exacerbation.  Chest x-ray negative for acute findings.  Doubt PE or other emergent pathology, doubt need for other emergent work up.  Will give prednisone, nebs, and reassess shortly.  4:28 PM Lung sounds improved after nebs, pt feeling better. Suspect COPD exacerbation. Will send home with prednisone burst, inhalers, and azithromycin. Advised other OTC remedies for symptomatic relief. F/up with PCP in 1wk for recheck. Smoking cessation encouraged. I explained the diagnosis and have given explicit precautions to return to the ER including for any other new or worsening symptoms. The patient understands and accepts the medical plan as it's been dictated and I have answered their questions. Discharge instructions concerning home care and prescriptions have been given. The patient is STABLE and is discharged to home in good condition.    Final Clinical Impressions(s) / ED Diagnoses   Final diagnoses:  COPD exacerbation ([place])  Cough  SOB (shortness of breath)  Tobacco user    ED Discharge Orders         Ordered    albuterol (PROVENTIL HFA;VENTOLIN HFA) 108 (90 Base) MCG/ACT inhaler  Every 4 hours PRN     06/14/18 1617    predniSONE (DELTASONE) 20 MG tablet     06/14/18 1617    azithromycin (ZITHROMAX Z-PAK) 250 MG tablet     06/14/18 [address] Assunta Pupo,  [place], PA-C 06/14/18 1628    Francine Graven, DO 06/15/18 1507

## 2018-06-14 NOTE — ED Triage Notes (Signed)
Pt states that he has been having chest congestion with coughing and "a lot of phlegm" for 1.5 weeks. Pt states no relief with OTC meds.

## 2018-06-19 ENCOUNTER — Encounter: Payer: Self-pay | Admitting: Emergency Medicine

## 2018-06-19 ENCOUNTER — Emergency Department (HOSPITAL_COMMUNITY): Payer: Medicaid Other

## 2018-06-19 ENCOUNTER — Other Ambulatory Visit: Payer: Self-pay

## 2018-06-19 ENCOUNTER — Ambulatory Visit (INDEPENDENT_AMBULATORY_CARE_PROVIDER_SITE_OTHER)
Admission: EM | Admit: 2018-06-19 | Discharge: 2018-06-19 | Disposition: A | Discharge status: Home / Self Care | Payer: Medicaid Other | Source: Home / Self Care

## 2018-06-19 ENCOUNTER — Observation Stay (HOSPITAL_COMMUNITY)
Admission: EM | Admit: 2018-06-19 | Discharge: 2018-06-21 | Disposition: A | Discharge status: Home / Self Care | Payer: Medicaid Other | Attending: Family Medicine | Admitting: Family Medicine

## 2018-06-19 ENCOUNTER — Encounter (HOSPITAL_COMMUNITY): Payer: Self-pay

## 2018-06-19 DIAGNOSIS — Z79899 Other long term (current) drug therapy: Secondary | ICD-10-CM | POA: Diagnosis not present

## 2018-06-19 DIAGNOSIS — Z9114 Patient's other noncompliance with medication regimen: Secondary | ICD-10-CM | POA: Insufficient documentation

## 2018-06-19 DIAGNOSIS — F17210 Nicotine dependence, cigarettes, uncomplicated: Secondary | ICD-10-CM | POA: Diagnosis not present

## 2018-06-19 DIAGNOSIS — R0902 Hypoxemia: Secondary | ICD-10-CM

## 2018-06-19 DIAGNOSIS — J441 Chronic obstructive pulmonary disease with (acute) exacerbation: Principal | ICD-10-CM | POA: Insufficient documentation

## 2018-06-19 DIAGNOSIS — I10 Essential (primary) hypertension: Secondary | ICD-10-CM | POA: Insufficient documentation

## 2018-06-19 DIAGNOSIS — Z716 Tobacco abuse counseling: Secondary | ICD-10-CM | POA: Diagnosis not present

## 2018-06-19 DIAGNOSIS — R05 Cough: Secondary | ICD-10-CM

## 2018-06-19 DIAGNOSIS — R9431 Abnormal electrocardiogram [ECG] [EKG]: Secondary | ICD-10-CM | POA: Insufficient documentation

## 2018-06-19 DIAGNOSIS — R059 Cough, unspecified: Secondary | ICD-10-CM

## 2018-06-19 DIAGNOSIS — Z91148 Patient's other noncompliance with medication regimen for other reason: Secondary | ICD-10-CM

## 2018-06-19 MED ORDER — IPRATROPIUM-ALBUTEROL 0.5-2.5 (3) MG/3ML IN SOLN
3.0000 mL | Freq: Once | RESPIRATORY_TRACT | Status: AC
  Administered 2018-06-19: 3 mL via RESPIRATORY_TRACT

## 2018-06-19 MED ORDER — ALBUTEROL SULFATE (2.5 MG/3ML) 0.083% IN NEBU
5.0000 mg | INHALATION_SOLUTION | Freq: Once | RESPIRATORY_TRACT | Status: AC
  Administered 2018-06-19: 5 mg via RESPIRATORY_TRACT
  Filled 2018-06-19: qty 6

## 2018-06-19 MED ORDER — DEXAMETHASONE SODIUM PHOSPHATE 10 MG/ML IJ SOLN
10.0000 mg | Freq: Once | INTRAMUSCULAR | Status: AC
  Administered 2018-06-19: 10 mg via INTRAMUSCULAR

## 2018-06-19 NOTE — ED Triage Notes (Addendum)
Pt presents to [facility] for assessment of continued and worsening SOB since his ER visit Saturday for COPD exacerbation. Patient not able to speak in full sentences.  Obvious discomfort.

## 2018-06-19 NOTE — ED Triage Notes (Signed)
Pt reports that he was seen at [facility] today and was hypoxic. He states that they wanted him to go the the ED by EMS, but he had to pick up his daughter. He reports a hx of COPD and states that he "tore up all his cigarettes today." O2 sats 96% in triage. He was given decadron at [facility].

## 2018-06-19 NOTE — ED Provider Notes (Signed)
[facility]    CSN: [id number] Arrival date & time: 06/19/18  1217     History   Chief Complaint Chief Complaint  Patient presents with  . Shortness of Breath    HPI Gavin Kane is a 63 y.o. male.   63 year old male with history of COPD, HTN comes in for worsening COPD exacerbation.  He was seen 06/14/2018 at the emergency department for similar symptoms.  At that time, SPO2 100% at room air, and had improvement of his symptoms after DuoNeb treatments.  He was provided prednisone and azithromycin, as well as albuterol inhaler.  He states he has been taking his medications as directed.  However, worsening shortness of breath and wheezing.  He last used his inhaler this morning.  Came in for evaluation and was found to be in respiratory distress at check in.      Past Medical History:  Diagnosis Date  . COPD (chronic obstructive pulmonary disease) ([place])   . HTN (hypertension)     Patient Active Problem List   Diagnosis Date Noted  . Cough 02/09/2018  . Encounter for smoking cessation counseling 11/17/2017  . Non compliance w medication regimen 04/19/2016  . Benzodiazepine abuse ([place]) 12/28/2015  . Anxiety state 05/18/2014  . Chronic fatigue 12/01/2012  . Tobacco abuse counseling 11/27/2012  . COPD (chronic obstructive pulmonary disease) ([place]) 10/13/2012  . DOE (dyspnea on exertion) 10/11/2012  . Nonspecific ST-T changes 10/11/2012  . Hypertension 10/11/2012    Past Surgical History:  Procedure Laterality Date  . HEMORRHOID SURGERY         Home Medications    Prior to Admission medications   Medication Sig Start Date End Date Taking? Authorizing Provider  albuterol (PROAIR HFA) 108 (90 Base) MCG/ACT inhaler inhale 1 to 2 puffs by mouth every 6 hours if needed for wheezing 03/12/18   Lockamy, Timothy, DO  albuterol (PROVENTIL HFA;VENTOLIN HFA) 108 (90 Base) MCG/ACT inhaler Inhale 1-2 puffs into the lungs every 4 (four) hours as needed for wheezing or  shortness of breath (or cough). 06/14/18   Street, [place], PA-C  azithromycin (ZITHROMAX Z-PAK) 250 MG tablet 2 po day one, then 1 daily x 4 days 06/14/18   Street, [place], PA-C  guaifenesin (ALTARUSSIN) 100 MG/5ML syrup Take 10 mLs (200 mg total) by mouth 3 (three) times daily as needed for cough. 02/09/18   Diallo, Earna Coder, MD  lisinopril (PRINIVIL,ZESTRIL) 5 MG tablet Take 1 tablet (5 mg total) by mouth daily. 03/12/18   Lockamy, Christia Reading, DO  mometasone-formoterol (DULERA) 200-5 MCG/ACT AERO Inhale 2 puffs into the lungs 2 (two) times daily. 03/12/18   Nuala Alpha, DO  predniSONE (DELTASONE) 20 MG tablet 2 tabs po daily x 4 days 02/07/18   Sherwood Gambler, MD  predniSONE (DELTASONE) 20 MG tablet 3 tabs po daily x 4 days STARTING 06/15/18 06/14/18   Street, [place], PA-C  sertraline (ZOLOFT) 25 MG tablet Take 1 tablet (25 mg total) by mouth at bedtime. Please take one tablet daily for the first 7 days. Then take two tablets daily. 03/12/18   Lockamy, Christia Reading, DO  tiotropium (SPIRIVA HANDIHALER) 18 MCG inhalation capsule Place 1 capsule (18 mcg total) into inhaler and inhale daily. 03/12/18   Nuala Alpha, DO    Family History Family History  Problem Relation Age of Onset  . Cancer Other        Aunt    Social History Social History   Tobacco Use  . Smoking status: Current Some Day Smoker  Packs/day: 0.75    Years: 40.00    Pack years: 30.00    Types: Cigarettes  . Smokeless tobacco: Former Systems developer    Quit date: 07/21/2012  Substance Use Topics  . Alcohol use: Yes    Alcohol/week: 0.0 standard drinks    Comment: Social  . Drug use: No     Allergies   Patient has no known allergies.   Review of Systems Review of Systems  Reason unable to perform ROS: See HPI as above.     Physical Exam Triage Vital Signs ED Triage Vitals [06/19/18 1224]  Enc Vitals Group     BP (!) 169/111     Pulse Rate (!) 108     Resp (!) 24     Temp      Temp Source Oral     SpO2 92 %      Weight      Height      Head Circumference      Peak Flow      Pain Score 0     Pain Loc      Pain Edu?      Excl. in [place]?    No data found.  Updated Vital Signs BP (!) 169/111 (BP Location: Left Arm)   Pulse (!) 108   Resp (!) 24   SpO2 92% Comment: talking  Physical Exam Constitutional:      Appearance: He is well-developed.     Comments: Patient having trouble speaking in full sentences. Nontoxic in appearance.   HENT:     Head: Normocephalic and atraumatic.     Mouth/Throat:     Mouth: Mucous membranes are moist.     Pharynx: Oropharynx is clear.  Cardiovascular:     Rate and Rhythm: Regular rhythm. Tachycardia present.     Heart sounds: No murmur. No friction rub. No gallop.   Pulmonary:     Comments: Patient in unable to speak in full sentences.  He is tachypneic, with increased effort with breathing.  Decreased air movement with expiratory wheezing throughout. Neurological:     Mental Status: He is alert.      UC Treatments / Results  Labs (all labs ordered are listed, but only abnormal results are displayed) Labs Reviewed - No data to display  EKG None  Radiology No results found.  Procedures Procedures (including critical care time)  Medications Ordered in UC Medications  dexamethasone (DECADRON) injection 10 mg (10 mg Intramuscular Given 06/19/18 1246)  ipratropium-albuterol (DUONEB) 0.5-2.5 (3) MG/3ML nebulizer solution 3 mL (3 mLs Nebulization Given 06/19/18 1235)  ipratropium-albuterol (DUONEB) 0.5-2.5 (3) MG/3ML nebulizer solution 3 mL (3 mLs Nebulization Given 06/19/18 1247)    Initial Impression / Assessment and Plan / UC Course  I have reviewed the triage vital signs and the nursing notes.  Pertinent labs & imaging results that were available during my care of the patient were reviewed by me and considered in my medical decision making (see chart for details).     Patient with improvement of symptoms after DuoNeb x1, able to speak in full  sentences.  Still with decreased air movement throughout with inspiratory and expiratory wheezing.  Patient with slight improvement of O2 saturation to 94% on room air, that slowly decreased while talking in the room.  He returns to 92% on room air, though continues to speak in full sentences.  Discussed with patient, given already on prednisone, albuterol with worsening of symptoms, will need further evaluation at the emergency department.  Patient declined EMS transport, stating he has to pick up his daughter.  Risks discussed with patient, including dying due to hypoxia. Patient understands risks, and states he will go straight to the ED after picking up his daughter. Will provide Duoneb x 2 and a decadron injection prior to leaving. Patient to go to the ED as soon as possible for further evaluation and management needed.  Final Clinical Impressions(s) / UC Diagnoses   Final diagnoses:  COPD exacerbation [facility])  Hypoxia    ED Prescriptions    None        Ok Edwards, PA-C 06/19/18 1317

## 2018-06-19 NOTE — ED Notes (Signed)
Patient able to ambulate independently  

## 2018-06-19 NOTE — Discharge Instructions (Signed)
As discussed, would prefer EMS transport given your oxygen level. Given you need to pick up your daughter, I have given you a steroid injection (decadron). Please go to the emergency department as soon as possible for further evaluation needed.

## 2018-06-20 ENCOUNTER — Other Ambulatory Visit: Payer: Self-pay

## 2018-06-20 DIAGNOSIS — J441 Chronic obstructive pulmonary disease with (acute) exacerbation: Secondary | ICD-10-CM | POA: Diagnosis not present

## 2018-06-20 LAB — CBC WITH DIFFERENTIAL/PLATELET
Abs Immature Granulocytes: 0.09 10*3/uL — ABNORMAL HIGH (ref 0.00–0.07)
Basophils Absolute: 0 10*3/uL (ref 0.0–0.1)
Basophils Relative: 0 %
Eosinophils Absolute: 0 10*3/uL (ref 0.0–0.5)
Eosinophils Relative: 0 %
HCT: 44.6 % (ref 39.0–52.0)
Hemoglobin: 14 g/dL (ref 13.0–17.0)
IMMATURE GRANULOCYTES: 1 %
Lymphocytes Relative: 12 %
Lymphs Abs: 1.4 10*3/uL (ref 0.7–4.0)
MCH: 28.9 pg (ref 26.0–34.0)
MCHC: 31.4 g/dL (ref 30.0–36.0)
MCV: 92 fL (ref 80.0–100.0)
Monocytes Absolute: 0.7 10*3/uL (ref 0.1–1.0)
Monocytes Relative: 6 %
NEUTROS PCT: 81 %
NRBC: 0 % (ref 0.0–0.2)
Neutro Abs: 9.1 10*3/uL — ABNORMAL HIGH (ref 1.7–7.7)
Platelets: 261 10*3/uL (ref 150–400)
RBC: 4.85 MIL/uL (ref 4.22–5.81)
RDW: 13.3 % (ref 11.5–15.5)
WBC: 11.2 10*3/uL — ABNORMAL HIGH (ref 4.0–10.5)

## 2018-06-20 LAB — BASIC METABOLIC PANEL
Anion gap: 9 (ref 5–15)
BUN: 20 mg/dL (ref 8–23)
CO2: 25 mmol/L (ref 22–32)
CREATININE: 0.81 mg/dL (ref 0.61–1.24)
Calcium: 9.6 mg/dL (ref 8.9–10.3)
Chloride: 104 mmol/L (ref 98–111)
GFR calc non Af Amer: >60 mL/min (ref >60)
Glucose, Bld: 121 mg/dL — ABNORMAL HIGH (ref 70–99)
Potassium: 4 mmol/L (ref 3.5–5.1)
Sodium: 138 mmol/L (ref 135–145)

## 2018-06-20 LAB — INFLUENZA PANEL BY PCR (TYPE A & B)
Influenza A By PCR: NEGATIVE
Influenza B By PCR: NEGATIVE

## 2018-06-20 LAB — POCT I-STAT TROPONIN I: Troponin i, poc: 0 ng/mL (ref 0.00–0.08)

## 2018-06-20 MED ORDER — IPRATROPIUM BROMIDE 0.02 % IN SOLN
0.5000 mg | Freq: Once | RESPIRATORY_TRACT | Status: AC
  Administered 2018-06-20: 0.5 mg via RESPIRATORY_TRACT
  Filled 2018-06-20: qty 2.5

## 2018-06-20 MED ORDER — MOMETASONE FURO-FORMOTEROL FUM 200-5 MCG/ACT IN AERO
2.0000 | INHALATION_SPRAY | Freq: Two times a day (BID) | RESPIRATORY_TRACT | Status: DC
  Filled 2018-06-20: qty 8.8

## 2018-06-20 MED ORDER — SODIUM CHLORIDE 0.9% FLUSH: 3.0000 mL | INTRAVENOUS | Status: DC | PRN

## 2018-06-20 MED ORDER — SODIUM CHLORIDE 0.9 % IV SOLN: 250.0000 mL | INTRAVENOUS | Status: DC | PRN

## 2018-06-20 MED ORDER — METHYLPREDNISOLONE SODIUM SUCC 125 MG IJ SOLR
80.0000 mg | Freq: Two times a day (BID) | INTRAMUSCULAR | Status: DC
  Administered 2018-06-20 – 2018-06-21 (×3): 80 mg via INTRAVENOUS
  Filled 2018-06-20 (×3): qty 2

## 2018-06-20 MED ORDER — GUAIFENESIN 100 MG/5ML PO SOLN
200.0000 mg | Freq: Three times a day (TID) | ORAL | Status: DC | PRN
  Administered 2018-06-20: 200 mg via ORAL
  Filled 2018-06-20: qty 10

## 2018-06-20 MED ORDER — DOXYCYCLINE HYCLATE 100 MG PO TABS
100.0000 mg | ORAL_TABLET | Freq: Once | ORAL | Status: AC
  Administered 2018-06-20: 100 mg via ORAL
  Filled 2018-06-20: qty 1

## 2018-06-20 MED ORDER — LISINOPRIL 5 MG PO TABS
5.0000 mg | ORAL_TABLET | Freq: Every day | ORAL | Status: DC
  Administered 2018-06-20 – 2018-06-21 (×2): 5 mg via ORAL
  Filled 2018-06-20 (×2): qty 1

## 2018-06-20 MED ORDER — ALBUTEROL SULFATE (2.5 MG/3ML) 0.083% IN NEBU: 2.5000 mg | INHALATION_SOLUTION | Freq: Four times a day (QID) | RESPIRATORY_TRACT | Status: DC

## 2018-06-20 MED ORDER — UMECLIDINIUM BROMIDE 62.5 MCG/INH IN AEPB
1.0000 | INHALATION_SPRAY | Freq: Every day | RESPIRATORY_TRACT | Status: DC
  Filled 2018-06-20: qty 7

## 2018-06-20 MED ORDER — ALBUTEROL SULFATE (2.5 MG/3ML) 0.083% IN NEBU
5.0000 mg | INHALATION_SOLUTION | Freq: Once | RESPIRATORY_TRACT | Status: AC
  Administered 2018-06-20: 5 mg via RESPIRATORY_TRACT
  Filled 2018-06-20: qty 6

## 2018-06-20 MED ORDER — ALBUTEROL SULFATE (2.5 MG/3ML) 0.083% IN NEBU: 2.5000 mg | INHALATION_SOLUTION | RESPIRATORY_TRACT | Status: DC | PRN

## 2018-06-20 MED ORDER — UMECLIDINIUM BROMIDE 62.5 MCG/INH IN AEPB
1.0000 | INHALATION_SPRAY | Freq: Every day | RESPIRATORY_TRACT | Status: DC
  Administered 2018-06-20 – 2018-06-21 (×2): 1 via RESPIRATORY_TRACT
  Filled 2018-06-20: qty 7

## 2018-06-20 MED ORDER — ALBUTEROL SULFATE (2.5 MG/3ML) 0.083% IN NEBU
2.5000 mg | INHALATION_SOLUTION | Freq: Three times a day (TID) | RESPIRATORY_TRACT | Status: DC
  Administered 2018-06-20 – 2018-06-21 (×3): 2.5 mg via RESPIRATORY_TRACT
  Filled 2018-06-20 (×4): qty 3

## 2018-06-20 MED ORDER — AZITHROMYCIN 250 MG PO TABS
250.0000 mg | ORAL_TABLET | Freq: Every day | ORAL | Status: DC
  Administered 2018-06-20 – 2018-06-21 (×2): 250 mg via ORAL
  Filled 2018-06-20 (×2): qty 1

## 2018-06-20 MED ORDER — IPRATROPIUM-ALBUTEROL 0.5-2.5 (3) MG/3ML IN SOLN
3.0000 mL | Freq: Four times a day (QID) | RESPIRATORY_TRACT | Status: DC
  Administered 2018-06-20: 3 mL via RESPIRATORY_TRACT
  Filled 2018-06-20: qty 3

## 2018-06-20 MED ORDER — IPRATROPIUM BROMIDE 0.02 % IN SOLN: 0.5000 mg | Freq: Four times a day (QID) | RESPIRATORY_TRACT | Status: DC

## 2018-06-20 MED ORDER — GUAIFENESIN ER 600 MG PO TB12
600.0000 mg | ORAL_TABLET | Freq: Two times a day (BID) | ORAL | Status: DC
  Administered 2018-06-20 – 2018-06-21 (×3): 600 mg via ORAL
  Filled 2018-06-20 (×3): qty 1

## 2018-06-20 MED ORDER — SODIUM CHLORIDE 0.9% FLUSH
3.0000 mL | Freq: Two times a day (BID) | INTRAVENOUS | Status: DC
  Administered 2018-06-20 (×2): 3 mL via INTRAVENOUS

## 2018-06-20 MED ORDER — MOMETASONE FURO-FORMOTEROL FUM 200-5 MCG/ACT IN AERO
2.0000 | INHALATION_SPRAY | Freq: Two times a day (BID) | RESPIRATORY_TRACT | Status: DC
  Administered 2018-06-20 – 2018-06-21 (×3): 2 via RESPIRATORY_TRACT
  Filled 2018-06-20: qty 8.8

## 2018-06-20 NOTE — Progress Notes (Signed)
I agree with the history physical and plan as per my partner will admit this patient this morning essentially a 63 year old male with recent COPD exacerbation came to the ED with cough on 06/14/2018 sputum etc.-was given albuterol prednisone and azithromycin discharged in good condition however failed outpatient management and return to the ED and was readmitted  He used to be a smoker, is on medication for hypertension lisinopril in addition to Zoloft  He feels a little better than when he first came in We will continue to monitor him as he still has slight wheeze keep him on Solu-Medrol every 12 and switch him tomorrow to oral prednisone if we feel he is resolved to the point that he can go on home  Verneita Griffes, MD [facility] 9:22 AM

## 2018-06-20 NOTE — ED Notes (Signed)
ED TO INPATIENT HANDOFF REPORT  Name/Age/Gender Gavin Kane 63 y.o. male  Code Status    Code Status Orders  (From admission, onward)         Start     Ordered   06/20/18 0437  Full code  Continuous     06/20/18 0437        Code Status History    Date Active Date Inactive Code Status Order ID Comments User Context   10/11/2012 1732 10/13/2012 2046 Full Code [id number]  Street, [place], MD Inpatient      Home/SNF/Other Home  Chief Complaint SOB/Pain when coughing  Level of Care/Admitting Diagnosis ED Disposition    ED Disposition Condition [place]: [facility] [[id number]]  Level of Care: Med-Surg [16]  Diagnosis: COPD exacerbation [facility]) [048889]  Admitting Physician: Phillips Grout [[id number]]  Attending Physician: Derrill Kay A [[id number]]  PT Class (Do Not Modify): Observation [104]  PT Acc Code (Do Not Modify): Observation [10022]       Medical History Past Medical History:  Diagnosis Date  . COPD (chronic obstructive pulmonary disease) ([place])   . HTN (hypertension)     Allergies No Known Allergies  IV Location/Drains/Wounds Patient Lines/Drains/Airways Status   Active Line/Drains/Airways    None          Labs/Imaging Results for orders placed or performed during the hospital encounter of 06/19/18 (from the past 48 hour(s))  Basic metabolic panel     Status: Abnormal   Collection Time: 06/20/18  2:03 AM  Result Value Ref Range   Sodium 138 135 - 145 mmol/L   Potassium 4.0 3.5 - 5.1 mmol/L   Chloride 104 98 - 111 mmol/L   CO2 25 22 - 32 mmol/L   Glucose, Bld 121 (H) 70 - 99 mg/dL   BUN 20 8 - 23 mg/dL   Creatinine, Ser 0.81 0.61 - 1.24 mg/dL   Calcium 9.6 8.9 - 10.3 mg/dL   GFR calc non Af Amer >60 >60 mL/min   GFR calc Af Amer >60 >60 mL/min   Anion gap 9 5 - 15    Comment: Performed at [facility], [place] [address] [place], [place] [address]  CBC with Differential     Status:  Abnormal   Collection Time: 06/20/18  2:03 AM  Result Value Ref Range   WBC 11.2 (H) 4.0 - 10.5 K/uL   RBC 4.85 4.22 - 5.81 MIL/uL   Hemoglobin 14.0 13.0 - 17.0 g/dL   HCT 44.6 39.0 - 52.0 %   MCV 92.0 80.0 - 100.0 fL   MCH 28.9 26.0 - 34.0 pg   MCHC 31.4 30.0 - 36.0 g/dL   RDW 13.3 11.5 - 15.5 %   Platelets 261 150 - 400 K/uL   nRBC 0.0 0.0 - 0.2 %   Neutrophils Relative % 81 %   Neutro Abs 9.1 (H) 1.7 - 7.7 K/uL   Lymphocytes Relative 12 %   Lymphs Abs 1.4 0.7 - 4.0 K/uL   Monocytes Relative 6 %   Monocytes Absolute 0.7 0.1 - 1.0 K/uL   Eosinophils Relative 0 %   Eosinophils Absolute 0.0 0.0 - 0.5 K/uL   Basophils Relative 0 %   Basophils Absolute 0.0 0.0 - 0.1 K/uL   Immature Granulocytes 1 %   Abs Immature Granulocytes 0.09 (H) 0.00 - 0.07 K/uL    Comment: Performed at [facility], [place] [address], [place],  [address]  POCT i-Stat troponin I     Status: None   Collection Time: 06/20/18  2:08 AM  Result Value Ref Range   Troponin i, poc 0.00 0.00 - 0.08 ng/mL   Comment 3            Comment: Due to the release kinetics of cTnI, a negative result within the first hours of the onset of symptoms does not rule out myocardial infarction with certainty. If myocardial infarction is still suspected, repeat the test at appropriate intervals.    Dg Chest 2 View  Result Date: 06/19/2018 CLINICAL DATA:  Shortness of breath.  History of COPD. EXAM: CHEST - 2 VIEW COMPARISON:  06/14/2018 FINDINGS: The cardiomediastinal silhouette is unchanged and within normal limits. The lungs are well inflated and clear. No pleural effusion or pneumothorax is identified. No acute osseous abnormality is seen. IMPRESSION: No active cardiopulmonary disease. Electronically Signed   By: Logan Bores M.D.   On: 06/19/2018 20:31   EKG Interpretation  Date/Time:  Saturday June 20 2018 02:47:06 EST Ventricular Rate:  91 PR Interval:    QRS Duration: 80 QT  Interval:  345 QTC Calculation: 425 R Axis:   68 Text Interpretation:  Age not entered, assumed to be  63 years old for purpose of ECG interpretation Sinus rhythm Abnormal R-wave progression, early transition Minimal ST depression, inferior leads No acute changes Confirmed by Varney Biles [id number]) on 06/20/2018 4:12:41 AM   Pending Labs Unresulted Labs (From admission, onward)    Start     Ordered   06/20/18 0421  Influenza panel by PCR (type A & B)  (Influenza PCR Panel)  Once,   R     06/20/18 0420          Vitals/Pain Today's Vitals   06/20/18 0138 06/20/18 0334 06/20/18 0345 06/20/18 0428  BP: (!) 149/80 (!) 186/85  116/66  Pulse: 82 (!) 103 99 (!) 102  Resp: 18 (!) 24 (!) 24 20  Temp:  97.9 F (36.6 C)    TempSrc:  Oral    SpO2: 93% 94% 96% 92%  PainSc:        Isolation Precautions No active isolations  Medications Medications  guaiFENesin (ROBITUSSIN) 100 MG/5ML solution 200 mg (has no administration in time range)  lisinopril (PRINIVIL,ZESTRIL) tablet 5 mg (has no administration in time range)  mometasone-formoterol (DULERA) 200-5 MCG/ACT inhaler 2 puff (has no administration in time range)  umeclidinium bromide (INCRUSE ELLIPTA) 62.5 MCG/INH 1 puff (has no administration in time range)  azithromycin (ZITHROMAX) tablet 250 mg (has no administration in time range)  sodium chloride flush (NS) 0.9 % injection 3 mL (has no administration in time range)  sodium chloride flush (NS) 0.9 % injection 3 mL (has no administration in time range)  0.9 %  sodium chloride infusion (has no administration in time range)  albuterol (PROVENTIL) (2.5 MG/3ML) 0.083% nebulizer solution 2.5 mg (has no administration in time range)  albuterol (PROVENTIL) (2.5 MG/3ML) 0.083% nebulizer solution 2.5 mg (has no administration in time range)  ipratropium (ATROVENT) nebulizer solution 0.5 mg (has no administration in time range)  guaiFENesin (MUCINEX) 12 hr tablet 600 mg (has no administration  in time range)  methylPREDNISolone sodium succinate (SOLU-MEDROL) 125 mg/2 mL injection 80 mg (has no administration in time range)  albuterol (PROVENTIL) (2.5 MG/3ML) 0.083% nebulizer solution 5 mg (5 mg Nebulization Given 06/19/18 1950)  albuterol (PROVENTIL) (2.5 MG/3ML) 0.083% nebulizer solution 5 mg (5 mg Nebulization Given 06/20/18 0030)  ipratropium (ATROVENT)  nebulizer solution 0.5 mg (0.5 mg Nebulization Given 06/20/18 0030)  doxycycline (VIBRA-TABS) tablet 100 mg (100 mg Oral Given 06/20/18 0029)  albuterol (PROVENTIL) (2.5 MG/3ML) 0.083% nebulizer solution 5 mg (5 mg Nebulization Given 06/20/18 0251)  ipratropium (ATROVENT) nebulizer solution 0.5 mg (0.5 mg Nebulization Given 06/20/18 0251)    Mobility walks

## 2018-06-20 NOTE — ED Provider Notes (Signed)
[facility] DEPT Provider Note   CSN: [id number] Arrival date & time: 06/19/18  1925     History   Chief Complaint Chief Complaint  Patient presents with  . Shortness of Breath    HPI Gavin Kane is a 63 y.o. male.  HPI  Patient is a 63 year old male with history of COPD and hypertension presenting for cough productive sputum, wheezing, shortness of breath.  Patient reports his symptoms been progressively worsening for approximately 1 week.  He was seen in the emergency department at [facility] long 4 days ago and prescribed 4 days of prednisone, and azithromycin.  He reports that rather than improving he was getting worse.  He reports that at the end of a small exertion he will require his albuterol inhaler.  He reports that he went to see urgent care today who stated that his oxygen saturation was 92% and need to go to the emergency department.  Patient reports that he has had cough productive of large amounts of sputum.  He reports wheezing with any small activity.  Denies chest pain.  Denies lower extremity edema or calf tenderness.  Denies history of DVT/PE, hormone use, cancer treatment, recent mobilization, hospitalization, recent surgery.  Past Medical History:  Diagnosis Date  . COPD (chronic obstructive pulmonary disease) ([place])   . HTN (hypertension)     Patient Active Problem List   Diagnosis Date Noted  . Cough 02/09/2018  . Encounter for smoking cessation counseling 11/17/2017  . Non compliance w medication regimen 04/19/2016  . Benzodiazepine abuse ([place]) 12/28/2015  . Anxiety state 05/18/2014  . Chronic fatigue 12/01/2012  . Tobacco abuse counseling 11/27/2012  . COPD (chronic obstructive pulmonary disease) ([place]) 10/13/2012  . DOE (dyspnea on exertion) 10/11/2012  . Nonspecific ST-T changes 10/11/2012  . Hypertension 10/11/2012    Past Surgical History:  Procedure Laterality Date  . HEMORRHOID SURGERY          Home  Medications    Prior to Admission medications   Medication Sig Start Date End Date Taking? Authorizing Provider  albuterol (PROAIR HFA) 108 (90 Base) MCG/ACT inhaler inhale 1 to 2 puffs by mouth every 6 hours if needed for wheezing 03/12/18  Yes Lockamy, Timothy, DO  albuterol (PROVENTIL HFA;VENTOLIN HFA) 108 (90 Base) MCG/ACT inhaler Inhale 1-2 puffs into the lungs every 4 (four) hours as needed for wheezing or shortness of breath (or cough). 06/14/18  Yes Street, [place], PA-C  guaifenesin (ALTARUSSIN) 100 MG/5ML syrup Take 10 mLs (200 mg total) by mouth 3 (three) times daily as needed for cough. 02/09/18  Yes Diallo, Abdoulaye, MD  lisinopril (PRINIVIL,ZESTRIL) 5 MG tablet Take 1 tablet (5 mg total) by mouth daily. 03/12/18  Yes Lockamy, Timothy, DO  tiotropium (SPIRIVA HANDIHALER) 18 MCG inhalation capsule Place 1 capsule (18 mcg total) into inhaler and inhale daily. 03/12/18  Yes Lockamy, Christia [place], DO  azithromycin (ZITHROMAX Z-PAK) 250 MG tablet 2 po day one, then 1 daily x 4 days Patient not taking: Reported on 06/19/2018 06/14/18   Street, [place], PA-C  mometasone-formoterol (DULERA) 200-5 MCG/ACT AERO Inhale 2 puffs into the lungs 2 (two) times daily. 03/12/18   Nuala Alpha, DO  predniSONE (DELTASONE) 20 MG tablet 2 tabs po daily x 4 days Patient not taking: Reported on 06/19/2018 02/07/18   Sherwood Gambler, MD  predniSONE (DELTASONE) 20 MG tablet 3 tabs po daily x 4 days STARTING 06/15/18 Patient not taking: Reported on 06/19/2018 06/14/18   Street, [place], PA-C  sertraline (ZOLOFT) 25  MG tablet Take 1 tablet (25 mg total) by mouth at bedtime. Please take one tablet daily for the first 7 days. Then take two tablets daily. Patient not taking: Reported on 06/19/2018 03/12/18   Nuala Alpha, DO    Family History Family History  Problem Relation Age of Onset  . Cancer Other        Aunt    Social History Social History   Tobacco Use  . Smoking status: Current Some Day Smoker     Packs/day: 0.75    Years: 40.00    Pack years: 30.00    Types: Cigarettes  . Smokeless tobacco: Former Systems developer    Quit date: 07/21/2012  Substance Use Topics  . Alcohol use: Yes    Alcohol/week: 0.0 standard drinks    Comment: Social  . Drug use: No     Allergies   Patient has no known allergies.   Review of Systems Review of Systems  Constitutional: Negative for chills and fever.  HENT: Negative for congestion and sore throat.   Eyes: Negative for visual disturbance.  Respiratory: Positive for cough, shortness of breath and wheezing. Negative for chest tightness.   Cardiovascular: Negative for chest pain, palpitations and leg swelling.  Gastrointestinal: Negative for abdominal pain and vomiting.  Genitourinary: Negative for dysuria and flank pain.  Musculoskeletal: Negative for back pain and myalgias.  Skin: Negative for rash.  Neurological: Negative for dizziness, syncope, light-headedness and headaches.     Physical Exam Updated Vital Signs BP (!) 186/85 (BP Location: Left Arm)   Pulse (!) 103   Temp 97.9 F (36.6 C) (Oral)   Resp (!) 24   SpO2 94%   Physical Exam Vitals signs and nursing note reviewed.  Constitutional:      General: He is not in acute distress.    Appearance: He is well-developed.  HENT:     Head: Normocephalic and atraumatic.  Eyes:     Conjunctiva/sclera: Conjunctivae normal.     Pupils: Pupils are equal, round, and reactive to light.  Neck:     Musculoskeletal: Normal range of motion and neck supple.  Cardiovascular:     Rate and Rhythm: Normal rate and regular rhythm.     Heart sounds: S1 normal and S2 normal. No murmur.     Comments: No lower extremity edema.  No calf tenderness. Pulmonary:     Effort: Pulmonary effort is normal.     Breath sounds: Examination of the right-middle field reveals wheezing. Examination of the left-middle field reveals wheezing. Examination of the right-lower field reveals wheezing. Examination of the  left-lower field reveals wheezing. Wheezing present. No rales.  Abdominal:     General: There is no distension.     Palpations: Abdomen is soft.     Tenderness: There is no abdominal tenderness. There is no guarding.  Musculoskeletal: Normal range of motion.        General: No deformity.  Lymphadenopathy:     Cervical: No cervical adenopathy.  Skin:    General: Skin is warm and dry.     Findings: No erythema or rash.  Neurological:     Mental Status: He is alert.     Comments: Cranial nerves grossly intact. Patient moves extremities symmetrically and with good coordination.  Psychiatric:        Behavior: Behavior normal.        Thought Content: Thought content normal.        Judgment: Judgment normal.      ED Treatments /  Results  Labs (all labs ordered are listed, but only abnormal results are displayed) Labs Reviewed  BASIC METABOLIC PANEL - Abnormal; Notable for the following components:      Result Value   Glucose, Bld 121 (*)    All other components within normal limits  CBC WITH DIFFERENTIAL/PLATELET - Abnormal; Notable for the following components:   WBC 11.2 (*)    Neutro Abs 9.1 (*)    Abs Immature Granulocytes 0.09 (*)    All other components within normal limits  I-STAT TROPONIN, ED  POCT I-STAT TROPONIN I    EKG None  Radiology Dg Chest 2 View  Result Date: 06/19/2018 CLINICAL DATA:  Shortness of breath.  History of COPD. EXAM: CHEST - 2 VIEW COMPARISON:  06/14/2018 FINDINGS: The cardiomediastinal silhouette is unchanged and within normal limits. The lungs are well inflated and clear. No pleural effusion or pneumothorax is identified. No acute osseous abnormality is seen. IMPRESSION: No active cardiopulmonary disease. Electronically Signed   By: Logan Bores M.D.   On: 06/19/2018 20:31    Procedures Procedures (including critical care time)  CRITICAL CARE Performed by: Albesa Seen   Total critical care time: 35 minutes  Critical care time was  exclusive of separately billable procedures and treating other patients.  Critical care was necessary to treat or prevent imminent or life-threatening deterioration.  Critical care was time spent personally by me on the following activities: development of treatment plan with patient and/or surrogate as well as nursing, discussions with consultants, evaluation of patient's response to treatment, examination of patient, obtaining history from patient or surrogate, ordering and performing treatments and interventions, ordering and review of laboratory studies, ordering and review of radiographic studies, pulse oximetry and re-evaluation of patient's condition.   Medications Ordered in ED Medications  albuterol (PROVENTIL) (2.5 MG/3ML) 0.083% nebulizer solution 5 mg (5 mg Nebulization Given 06/19/18 1950)  albuterol (PROVENTIL) (2.5 MG/3ML) 0.083% nebulizer solution 5 mg (5 mg Nebulization Given 06/20/18 0030)  ipratropium (ATROVENT) nebulizer solution 0.5 mg (0.5 mg Nebulization Given 06/20/18 0030)  doxycycline (VIBRA-TABS) tablet 100 mg (100 mg Oral Given 06/20/18 0029)  albuterol (PROVENTIL) (2.5 MG/3ML) 0.083% nebulizer solution 5 mg (5 mg Nebulization Given 06/20/18 0251)  ipratropium (ATROVENT) nebulizer solution 0.5 mg (0.5 mg Nebulization Given 06/20/18 0251)     Initial Impression / Assessment and Plan / ED Course  I have reviewed the triage vital signs and the nursing notes.  Pertinent labs & imaging results that were available during my care of the patient were reviewed by me and considered in my medical decision making (see chart for details).  Clinical Course as of Jun 20 420  Sat Jun 20, 2018  0345 Likely response to multiple albuterol treatments.   Pulse Rate(!): 103 [AM]  4431 Reassess.  Patient did ambulate with symptomatic shortness of breath.  His oxygen saturation was 92%, however he reports that he was winded and wheezing after the end of this.   [AM]  5400 Spoke with Dr.  Estelle Grumbles admit patient.  I appreciate her involvement in the care of this patient.   [AM]    Clinical Course User Index [AM] Tamala Julian    This is a 63 year old male with a history of COPD and hypertension presenting for progressively worsening shortness of breath and wheezing.  He has had increase sputum production consistent with COPD exacerbation.  He has been failing outpatient therapy over the past 4 days after being evaluated at both emergency department  and urgent care.    Doubt pulmonary embolism as cause patient symptoms given no other precipitating risk factors.  For the majority of his emergency department visit he was not tachycardic or tachypneic.  He is also having cough and sputum production consistent more with COPD exacerbation.  Patient is slight leukocytosis consistent with stress demargination and/or steroid use.  Troponin is negative.  EKG without acute ischemia, infarction, or arrhythmia.  Chest x-ray is negative for acute infiltrate.  Treated with Decadron at urgent care earlier today, did not repeat steroid dose.  Patient was given 3 nebulizer treatments in the emergency department with minimal improvement in his symptoms.  He did ambulate was able to maintain his oxygen saturation, but was severely symptomatic.  At this time, we will seek admission for patient as he appears to be failing outpatient therapy for COPD exacerbation.  This is a shared visit with Dr. Varney Biles. Patient was independently evaluated by this attending physician. Attending physician consulted in evaluation and admission management.  Final Clinical Impressions(s) / ED Diagnoses   Final diagnoses:  COPD exacerbation ([place])  Elevated blood pressure reading in office with diagnosis of hypertension    ED Discharge Orders    None       Tamala Julian 06/20/18 Mellody Dance, MD 06/20/18 [phone]

## 2018-06-20 NOTE — H&P (Signed)
History and Physical    Gavin Kane IFO:[id number] DOB: 11/01/1955 DOA: 06/19/2018  PCP: Nuala Alpha, DO  Patient coming from: Home  Chief Complaint: Shortness of breath and wheezing  HPI: Gavin Kane is a 63 y.o. male with medical history significant of COPD and hypertension recently diagnosed with a COPD exacerbation started on a Z-Pak and oral prednisone he reports he feels these and has been taking them but is not gotten any better he comes in tonight with persistent wheezing.  He is a smoker and continues to smoke.  He has been having a dry nonproductive cough.  He states that his son who is young has recently had the flu.  Patient is been having symptoms for over 4 days.  He denies any fevers.  He denies any nausea vomiting or diarrhea.  He denies any pain anywhere.  Patient be referred for admission for COPD exacerbation.  Review of Systems: As per HPI otherwise 10 point review of systems negative.   Past Medical History:  Diagnosis Date  . COPD (chronic obstructive pulmonary disease) ([place])   . HTN (hypertension)     Past Surgical History:  Procedure Laterality Date  . HEMORRHOID SURGERY       reports that he has been smoking cigarettes. He has a 30.00 pack-year smoking history. He quit smokeless tobacco use about 5 years ago. He reports current alcohol use. He reports that he does not use drugs.  No Known Allergies  Family History  Problem Relation Age of Onset  . Cancer Other        Aunt    Prior to Admission medications   Medication Sig Start Date End Date Taking? Authorizing Provider  albuterol (PROAIR HFA) 108 (90 Base) MCG/ACT inhaler inhale 1 to 2 puffs by mouth every 6 hours if needed for wheezing 03/12/18  Yes Lockamy, Timothy, DO  albuterol (PROVENTIL HFA;VENTOLIN HFA) 108 (90 Base) MCG/ACT inhaler Inhale 1-2 puffs into the lungs every 4 (four) hours as needed for wheezing or shortness of breath (or cough). 06/14/18  Yes Street, [place], PA-C  guaifenesin  (ALTARUSSIN) 100 MG/5ML syrup Take 10 mLs (200 mg total) by mouth 3 (three) times daily as needed for cough. 02/09/18  Yes Diallo, Abdoulaye, MD  lisinopril (PRINIVIL,ZESTRIL) 5 MG tablet Take 1 tablet (5 mg total) by mouth daily. 03/12/18  Yes Lockamy, Timothy, DO  tiotropium (SPIRIVA HANDIHALER) 18 MCG inhalation capsule Place 1 capsule (18 mcg total) into inhaler and inhale daily. 03/12/18  Yes Lockamy, Christia [place], DO  azithromycin (ZITHROMAX Z-PAK) 250 MG tablet 2 po day one, then 1 daily x 4 days Patient not taking: Reported on 06/19/2018 06/14/18   Street, [place], PA-C  mometasone-formoterol (DULERA) 200-5 MCG/ACT AERO Inhale 2 puffs into the lungs 2 (two) times daily. 03/12/18   Nuala Alpha, DO  predniSONE (DELTASONE) 20 MG tablet 2 tabs po daily x 4 days Patient not taking: Reported on 06/19/2018 02/07/18   Sherwood Gambler, MD  predniSONE (DELTASONE) 20 MG tablet 3 tabs po daily x 4 days STARTING 06/15/18 Patient not taking: Reported on 06/19/2018 06/14/18   Street, [place], PA-C  sertraline (ZOLOFT) 25 MG tablet Take 1 tablet (25 mg total) by mouth at bedtime. Please take one tablet daily for the first 7 days. Then take two tablets daily. Patient not taking: Reported on 06/19/2018 03/12/18   Nuala Alpha, DO    Physical Exam: Vitals:   06/20/18 0138 06/20/18 0334 06/20/18 0345 06/20/18 0428  BP: (!) 149/80 (!) 186/85  116/66  Pulse: 82 (!) 103 99 (!) 102  Resp: 18 (!) 24 (!) 24 20  Temp:  97.9 F (36.6 C)    TempSrc:  Oral    SpO2: 93% 94% 96% 92%      Constitutional: NAD, calm, comfortable Vitals:   06/20/18 0138 06/20/18 0334 06/20/18 0345 06/20/18 0428  BP: (!) 149/80 (!) 186/85  116/66  Pulse: 82 (!) 103 99 (!) 102  Resp: 18 (!) 24 (!) 24 20  Temp:  97.9 F (36.6 C)    TempSrc:  Oral    SpO2: 93% 94% 96% 92%   Eyes: PERRL, lids and conjunctivae normal ENMT: Mucous membranes are moist. Posterior pharynx clear of any exudate or lesions.Normal dentition.  Neck: normal,  supple, no masses, no thyromegaly Respiratory: clear to auscultation bilaterally, mild end expiratory bilateral wheezing, no crackles. Normal respiratory effort. No accessory muscle use.  Cardiovascular: Regular rate and rhythm, no murmurs / rubs / gallops. No extremity edema. 2+ pedal pulses. No carotid bruits.  Abdomen: no tenderness, no masses palpated. No hepatosplenomegaly. Bowel sounds positive.  Musculoskeletal: no clubbing / cyanosis. No joint deformity upper and lower extremities. Good ROM, no contractures. Normal muscle tone.  Skin: no rashes, lesions, ulcers. No induration Neurologic: CN 2-12 grossly intact. Sensation intact, DTR normal. Strength 5/5 in all 4.  Psychiatric: Normal judgment and insight. Alert and oriented x 3. Normal mood.    Labs on Admission: I have personally reviewed following labs and imaging studies  CBC: Recent Labs  Lab 06/20/18 0203  WBC 11.2*  NEUTROABS 9.1*  HGB 14.0  HCT 44.6  MCV 92.0  PLT 196   Basic Metabolic Panel: Recent Labs  Lab 06/20/18 0203  NA 138  K 4.0  CL 104  CO2 25  GLUCOSE 121*  BUN 20  CREATININE 0.81  CALCIUM 9.6   GFR: Estimated Creatinine Clearance: 81.2 mL/min (by C-G formula based on SCr of 0.81 mg/dL). Liver Function Tests: No results for input(s): AST, ALT, ALKPHOS, BILITOT, PROT, ALBUMIN in the last 168 hours. No results for input(s): LIPASE, AMYLASE in the last 168 hours. No results for input(s): AMMONIA in the last 168 hours. Coagulation Profile: No results for input(s): INR, PROTIME in the last 168 hours. Cardiac Enzymes: No results for input(s): CKTOTAL, CKMB, CKMBINDEX, TROPONINI in the last 168 hours. BNP (last 3 results) No results for input(s): PROBNP in the last 8760 hours. HbA1C: No results for input(s): HGBA1C in the last 72 hours. CBG: No results for input(s): GLUCAP in the last 168 hours. Lipid Profile: No results for input(s): CHOL, HDL, LDLCALC, TRIG, CHOLHDL, LDLDIRECT in the last 72  hours. Thyroid Function Tests: No results for input(s): TSH, T4TOTAL, FREET4, T3FREE, THYROIDAB in the last 72 hours. Anemia Panel: No results for input(s): VITAMINB12, FOLATE, FERRITIN, TIBC, IRON, RETICCTPCT in the last 72 hours. Urine analysis:    Component Value Date/Time   COLORURINE AMBER (A) 08/16/2014 1624   APPEARANCEUR CLOUDY (A) 08/16/2014 1624   LABSPEC 1.030 08/16/2014 1624   PHURINE 6.5 08/16/2014 1624   GLUCOSEU NEGATIVE 08/16/2014 1624   HGBUR NEGATIVE 08/16/2014 1624   BILIRUBINUR SMALL (A) 08/16/2014 1624   KETONESUR NEGATIVE 08/16/2014 1624   PROTEINUR NEGATIVE 08/16/2014 1624   UROBILINOGEN 2.0 (H) 08/16/2014 1624   NITRITE NEGATIVE 08/16/2014 1624   LEUKOCYTESUR NEGATIVE 08/16/2014 1624   Sepsis Labs: !!!!!!!!!!!!!!!!!!!!!!!!!!!!!!!!!!!!!!!!!!!! @LABRCNTIP (procalcitonin:4,lacticidven:4) )No results found for this or any previous visit (from the past 240 hour(s)).   Radiological Exams on Admission: Dg Chest 2  View  Result Date: 06/19/2018 CLINICAL DATA:  Shortness of breath.  History of COPD. EXAM: CHEST - 2 VIEW COMPARISON:  06/14/2018 FINDINGS: The cardiomediastinal silhouette is unchanged and within normal limits. The lungs are well inflated and clear. No pleural effusion or pneumothorax is identified. No acute osseous abnormality is seen. IMPRESSION: No active cardiopulmonary disease. Electronically Signed   By: Logan Bores M.D.   On: 06/19/2018 20:31   Chest x-ray reviewed no edema or infiltrate Old chart reviewed Discussed with EDP PA  Assessment/Plan 63 year old male with COPD exacerbation Principal Problem:   COPD exacerbation (HCC)-placed on IV Solu-Medrol.  Placed on frequent bronchodilators.  Continue azithromycin.  Supportive care.  Check influenza swab.  Afebrile vital signs stable O2 sats 92% on room air.  Patient ambulated in the day he got more short of breath and more wheezing but his vitals remain normal.  Active Problems:    Hypertension-continue home meds    Tobacco abuse counseling-we will need tobacco cessation education prior to discharge    Non compliance w medication regimen-patient reports he has been compliant with his azithromycin and steroids that were prescribed to him earlier in the week.     DVT prophylaxis: SCDs Code Status: Full Family Communication: None Disposition Plan: Tomorrow Consults called: None Admission status: Observation   DAVID,RACHAL A MD [facility]  If 7PM-7AM, please contact night-coverage [email or web address] Password [facility]  06/20/2018, 4:34 AM

## 2018-06-21 DIAGNOSIS — J441 Chronic obstructive pulmonary disease with (acute) exacerbation: Secondary | ICD-10-CM | POA: Diagnosis not present

## 2018-06-21 MED ORDER — GUAIFENESIN 100 MG/5ML PO SYRP: 200.0000 mg | ORAL_SOLUTION | Freq: Three times a day (TID) | ORAL | 0 refills | Status: DC | PRN

## 2018-06-21 MED ORDER — PREDNISONE 20 MG PO TABS: ORAL_TABLET | ORAL | 0 refills | Status: DC

## 2018-06-21 MED ORDER — MOMETASONE FURO-FORMOTEROL FUM 200-5 MCG/ACT IN AERO: 2.0000 | INHALATION_SPRAY | Freq: Two times a day (BID) | RESPIRATORY_TRACT | 3 refills | Status: DC

## 2018-06-21 MED ORDER — AZITHROMYCIN 250 MG PO TABS: ORAL_TABLET | ORAL | 0 refills | Status: DC

## 2018-06-21 NOTE — Discharge Summary (Signed)
Physician Discharge Summary  Gavin Kane TIR:[id number] DOB: 07-31-1955 DOA: 06/19/2018  PCP: Nuala Alpha, DO  Admit date: 06/19/2018 Discharge date: 06/21/2018  Time spent: 37 minutes  Recommendations for Outpatient Follow-up:  1. Patient given a burst of prednisone in addition to 3 more days of p.o. antibiotics 2. Will need outpatient PFTs 3. Recommend continued cessation counseling in the outpatient setting  Discharge Diagnoses:  Principal Problem:   COPD exacerbation ([place]) Active Problems:   Hypertension   Tobacco abuse counseling   Non compliance w medication regimen   Discharge Condition: Improved  Diet recommendation: Heart healthy  Filed Weights   06/20/18 0624  Weight: 60.7 kg    History of present illness:  63 year old male with essentially HTN, COPD, continued smoker admitted after having come to the ED 06/14/2018 with increasing sputum cough Was given prescription at that time for albuterol prednisone and azithromycin Failed outpatient management came back in with further wheeze and discomfort No noted pneumonia on exam He was given Solu-Medrol for a day and continued nebs and improved dramatically He was able to ambulate about 100 feet in the hallway and did not desat although felt a little winded He tells me that he has been feeling winded for the past couple of months I would suggest outpatient PFTs He was discharged with prescriptions for mometasone which it appears he is run out of, prednisone and azithromycin to complete in the outpatient setting and should see his primary care physician probably in 1 to 2 weeks   Discharge Exam: Vitals:   06/20/18 2155 06/21/18 0616  BP: (!) 145/96   Pulse:    Resp:    Temp:    SpO2:  94%    General: Awake alert pleasant no distress Cardiovascular: S1-S2 no murmur rub or gallop Respiratory: Clinically clear no wheeze no rales no rhonchi no additional adventitious sounds Abdomen is soft nontender no rebound  no guarding Neurologically intact Gait is good Power is 5/5  Discharge Instructions   Discharge Instructions    Diet - low sodium heart healthy   Complete by:  As directed    Discharge instructions   Complete by:  As directed    Complete steroids as well as antibiotics as directed I will call in few some cough syrup It appears you might of run out of your mometasone which should be taken twice a day as daily preventative so you should continue this I would recommend you follow-up with your primary physician in about 1 week to ensure that things have resolved You may need some lung testing in the outpatient setting   Increase activity slowly   Complete by:  As directed      Allergies as of 06/21/2018   No Known Allergies     Medication List    STOP taking these medications   sertraline 25 MG tablet Commonly known as:  ZOLOFT     TAKE these medications   albuterol 108 (90 Base) MCG/ACT inhaler Commonly known as:  PROAIR HFA inhale 1 to 2 puffs by mouth every 6 hours if needed for wheezing   albuterol 108 (90 Base) MCG/ACT inhaler Commonly known as:  PROVENTIL HFA;VENTOLIN HFA Inhale 1-2 puffs into the lungs every 4 (four) hours as needed for wheezing or shortness of breath (or cough).   azithromycin 250 MG tablet Commonly known as:  ZITHROMAX Z-PAK Complete 2 tabs daily for 3 days What changed:  additional instructions   guaifenesin 100 MG/5ML syrup Commonly known as:  ALTARUSSIN Take  10 mLs (200 mg total) by mouth 3 (three) times daily as needed for cough.   lisinopril 5 MG tablet Commonly known as:  PRINIVIL,ZESTRIL Take 1 tablet (5 mg total) by mouth daily.   mometasone-formoterol 200-5 MCG/ACT Aero Commonly known as:  DULERA Inhale 2 puffs into the lungs 2 (two) times daily.   predniSONE 20 MG tablet Commonly known as:  DELTASONE 2 tabs po daily x 4 days What changed:  Another medication with the same name was removed. Continue taking this medication,  and follow the directions you see here.   tiotropium 18 MCG inhalation capsule Commonly known as:  SPIRIVA HANDIHALER Place 1 capsule (18 mcg total) into inhaler and inhale daily.      No Known Allergies    The results of significant diagnostics from this hospitalization (including imaging, microbiology, ancillary and laboratory) are listed below for reference.    Significant Diagnostic Studies: Dg Chest 2 View  Result Date: 06/19/2018 CLINICAL DATA:  Shortness of breath.  History of COPD. EXAM: CHEST - 2 VIEW COMPARISON:  06/14/2018 FINDINGS: The cardiomediastinal silhouette is unchanged and within normal limits. The lungs are well inflated and clear. No pleural effusion or pneumothorax is identified. No acute osseous abnormality is seen. IMPRESSION: No active cardiopulmonary disease. Electronically Signed   By: Logan Bores M.D.   On: 06/19/2018 20:31   Dg Chest 2 View  Result Date: 06/14/2018 CLINICAL DATA:  63 year old male with history of chest congestion and cough. EXAM: CHEST - 2 VIEW COMPARISON:  Chest x-ray 02/06/2018. FINDINGS: Lung volumes are normal. No consolidative airspace disease. No pleural effusions. No pneumothorax. No pulmonary nodule or mass noted. Pulmonary vasculature and the cardiomediastinal silhouette are within normal limits. IMPRESSION: No radiographic evidence of acute cardiopulmonary disease. Electronically Signed   By: Vinnie Langton M.D.   On: 06/14/2018 13:18    Microbiology: No results found for this or any previous visit (from the past 240 hour(s)).   Labs: Basic Metabolic Panel: Recent Labs  Lab 06/20/18 0203  NA 138  K 4.0  CL 104  CO2 25  GLUCOSE 121*  BUN 20  CREATININE 0.81  CALCIUM 9.6   Liver Function Tests: No results for input(s): AST, ALT, ALKPHOS, BILITOT, PROT, ALBUMIN in the last 168 hours. No results for input(s): LIPASE, AMYLASE in the last 168 hours. No results for input(s): AMMONIA in the last 168 hours. CBC: Recent  Labs  Lab 06/20/18 0203  WBC 11.2*  NEUTROABS 9.1*  HGB 14.0  HCT 44.6  MCV 92.0  PLT 261   Cardiac Enzymes: No results for input(s): CKTOTAL, CKMB, CKMBINDEX, TROPONINI in the last 168 hours. BNP: BNP (last 3 results) No results for input(s): BNP in the last 8760 hours.  ProBNP (last 3 results) No results for input(s): PROBNP in the last 8760 hours.  CBG: No results for input(s): GLUCAP in the last 168 hours.     Signed:  Nita Sells MD   [facility] 06/21/2018, 8:21 AM

## 2018-06-21 NOTE — Progress Notes (Signed)
Assessment unchanged. Pt verbalized understanding of dc instructions including smoking cessation, follow up care with PCP, and when to call the doctor. Dr. Verlon Au ambulated pt earlier, prior to dc order, and O2 sats with activity maintained in mid 90's. No distress noted then or now. Scripts x 3 given as provided by MD. Pt dc'd via wc to meet cab at ED entrance.

## 2018-07-07 ENCOUNTER — Encounter: Payer: Medicaid Other | Admitting: Family Medicine

## 2018-07-13 ENCOUNTER — Ambulatory Visit
Admission: EM | Admit: 2018-07-13 | Discharge: 2018-07-13 | Disposition: A | Discharge status: Home / Self Care | Payer: Medicaid Other | Attending: Family Medicine | Admitting: Family Medicine

## 2018-07-13 ENCOUNTER — Encounter: Payer: Self-pay | Admitting: Emergency Medicine

## 2018-07-13 DIAGNOSIS — Z8709 Personal history of other diseases of the respiratory system: Secondary | ICD-10-CM

## 2018-07-13 DIAGNOSIS — R05 Cough: Secondary | ICD-10-CM | POA: Diagnosis not present

## 2018-07-13 DIAGNOSIS — R062 Wheezing: Secondary | ICD-10-CM

## 2018-07-13 DIAGNOSIS — R0602 Shortness of breath: Secondary | ICD-10-CM | POA: Diagnosis not present

## 2018-07-13 DIAGNOSIS — R059 Cough, unspecified: Secondary | ICD-10-CM

## 2018-07-13 MED ORDER — PREDNISONE 20 MG PO TABS: 20.0000 mg | ORAL_TABLET | Freq: Two times a day (BID) | ORAL | 0 refills | Status: DC

## 2018-07-13 MED ORDER — GUAIFENESIN 100 MG/5ML PO SYRP: 200.0000 mg | ORAL_SOLUTION | Freq: Three times a day (TID) | ORAL | 1 refills | Status: DC | PRN

## 2018-07-13 MED ORDER — IPRATROPIUM-ALBUTEROL 0.5-2.5 (3) MG/3ML IN SOLN
3.0000 mL | Freq: Once | RESPIRATORY_TRACT | Status: AC
  Administered 2018-07-13: 3 mL via RESPIRATORY_TRACT

## 2018-07-13 NOTE — Discharge Instructions (Signed)
Take prednisone 2 times a day for 5 days Drink lots of fluids Use cough medicine to help loosen mucus and control cough Continue your Dulera inhaler, your Spiriva inhaler every day and albuterol as needed When you get home today, call your primary care doctor to set up an appointment.  You need to discuss with him getting a nebulizer for home use.  Also discussed with him a Prevnar pneumonia shot

## 2018-07-13 NOTE — ED Triage Notes (Signed)
Pt presents to [facility] for assessment of difficulty clearing secretions, which is causing him to feel like he can't catch his breath.  C/o URI symptoms x 1 month, previously admitted for the same.  Hx of COPD.

## 2018-07-13 NOTE — ED Provider Notes (Signed)
[facility]    CSN: [id number] Arrival date & time: 07/13/18  1019     History   Chief Complaint Chief Complaint  Patient presents with  . Cough    HPI Gavin Kane is a 63 y.o. male.   HPI  Patient with chronic COPD and multiple exacerbations requiring ER visits and hospitalizations. He does not have a nebulizer at home.  He has been using his albuterol more than usual. He states that he is compliant with his Dulera and Spiriva.  He is noncompliant with efforts to quit smoking. He is here for increased cough and congestion, increasing shortness of breath.  He would like an nebulized treatment.  He has thick mucus that is hard to cough up. No fever or chills.  No chest pain.  The sputum he is coughing up tends to be thick and white.  No blood.  Past Medical History:  Diagnosis Date  . COPD (chronic obstructive pulmonary disease) ([place])   . HTN (hypertension)     Patient Active Problem List   Diagnosis Date Noted  . COPD exacerbation ([place]) 06/20/2018  . Cough 02/09/2018  . Encounter for smoking cessation counseling 11/17/2017  . Non compliance w medication regimen 04/19/2016  . Benzodiazepine abuse ([place]) 12/28/2015  . Anxiety state 05/18/2014  . Chronic fatigue 12/01/2012  . Tobacco abuse counseling 11/27/2012  . COPD (chronic obstructive pulmonary disease) ([place]) 10/13/2012  . DOE (dyspnea on exertion) 10/11/2012  . Nonspecific ST-T changes 10/11/2012  . Hypertension 10/11/2012    Past Surgical History:  Procedure Laterality Date  . HEMORRHOID SURGERY         Home Medications    Prior to Admission medications   Medication Sig Start Date End Date Taking? Authorizing Provider  albuterol (PROVENTIL HFA;VENTOLIN HFA) 108 (90 Base) MCG/ACT inhaler Inhale 1-2 puffs into the lungs every 4 (four) hours as needed for wheezing or shortness of breath (or cough). 06/14/18  Yes Street, [place], PA-C  lisinopril (PRINIVIL,ZESTRIL) 5 MG tablet Take 1 tablet (5  mg total) by mouth daily. 03/12/18  Yes Lockamy, Timothy, DO  mometasone-formoterol (DULERA) 200-5 MCG/ACT AERO Inhale 2 puffs into the lungs 2 (two) times daily. 06/21/18  Yes Nita Sells, MD  tiotropium (SPIRIVA HANDIHALER) 18 MCG inhalation capsule Place 1 capsule (18 mcg total) into inhaler and inhale daily. 03/12/18  Yes Lockamy, Timothy, DO  guaifenesin (ALTARUSSIN) 100 MG/5ML syrup Take 10 mLs (200 mg total) by mouth 3 (three) times daily as needed for cough. 07/13/18   Raylene Everts, MD  predniSONE (DELTASONE) 20 MG tablet Take 1 tablet (20 mg total) by mouth 2 (two) times daily with a meal. 07/13/18   Raylene Everts, MD    Family History Family History  Problem Relation Age of Onset  . Cancer Other        Aunt    Social History Social History   Tobacco Use  . Smoking status: Current Some Day Smoker    Packs/day: 0.75    Years: 40.00    Pack years: 30.00    Types: Cigarettes  . Smokeless tobacco: Former Systems developer    Quit date: 07/21/2012  Substance Use Topics  . Alcohol use: Yes    Alcohol/week: 0.0 standard drinks    Comment: Social  . Drug use: No     Allergies   Patient has no known allergies.   Review of Systems Review of Systems  Constitutional: Negative for chills and fever.  HENT: Negative for ear pain and  sore throat.   Eyes: Negative for pain and visual disturbance.  Respiratory: Positive for cough, shortness of breath and wheezing.   Cardiovascular: Negative for chest pain and palpitations.  Gastrointestinal: Negative for abdominal pain and vomiting.  Genitourinary: Negative for dysuria and hematuria.  Musculoskeletal: Negative for arthralgias and back pain.  Skin: Negative for color change and rash.  Neurological: Negative for seizures and syncope.  All other systems reviewed and are negative.    Physical Exam Triage Vital Signs ED Triage Vitals [07/13/18 1030]  Enc Vitals Group     BP (!) 169/87     Pulse Rate 87     Resp 20      Temp 98 F (36.7 C)     Temp Source Oral     SpO2 97 %     Weight      Height      Head Circumference      Peak Flow      Pain Score 3     Pain Loc      Pain Edu?      Excl. in [place]?    No data found.  Updated Vital Signs BP (!) 169/87 (BP Location: Left Arm)   Pulse 87   Temp 98 F (36.7 C) (Oral)   Resp 20   SpO2 97%      Physical Exam Constitutional:      General: He is not in acute distress.    Appearance: He is well-developed and normal weight.  HENT:     Head: Normocephalic and atraumatic.     Right Ear: Tympanic membrane and ear canal normal.     Left Ear: Tympanic membrane and ear canal normal.     Nose: Nose normal.     Mouth/Throat:     Mouth: Mucous membranes are moist.     Comments: Edentulous.  Well-hydrated Eyes:     Conjunctiva/sclera: Conjunctivae normal.     Pupils: Pupils are equal, round, and reactive to light.  Neck:     Musculoskeletal: Normal range of motion and neck supple.  Cardiovascular:     Rate and Rhythm: Normal rate and regular rhythm.     Heart sounds: Normal heart sounds.  Pulmonary:     Effort: Pulmonary effort is normal. No respiratory distress.     Breath sounds: Wheezing and rhonchi present.     Comments: Initially patient had diminished breath sounds and wheezing.  After nebulized treatment his lungs have only few scattered wheeze.  Rhonchi centrally Abdominal:     General: There is no distension.     Palpations: Abdomen is soft.  Musculoskeletal: Normal range of motion.  Lymphadenopathy:     Cervical: No cervical adenopathy.  Skin:    General: Skin is warm and dry.  Neurological:     Mental Status: He is alert.      UC Treatments / Results  Labs (all labs ordered are listed, but only abnormal results are displayed) Labs Reviewed - No data to display  EKG None  Radiology No results found.  Procedures Procedures (including critical care time)  Medications Ordered in UC Medications  ipratropium-albuterol  (DUONEB) 0.5-2.5 (3) MG/3ML nebulizer solution 3 mL (3 mLs Nebulization Given 07/13/18 1047)    Initial Impression / Assessment and Plan / UC Course  I have reviewed the triage vital signs and the nursing notes.  Pertinent labs & imaging results that were available during my care of the patient were reviewed by me and considered in my  medical decision making (see chart for details).     I told patient that his primary care doctor should order him nebulizer for him.  Patient states he missed his last primary care visit and he is encouraged to call and see them in follow-up.  I discussed with him the importance of quitting smoking.  I also looked at his immunization records and it appears he may be due for his second pneumonia shot. Final Clinical Impressions(s) / UC Diagnoses   Final diagnoses:  Cough     Discharge Instructions     Take prednisone 2 times a day for 5 days Drink lots of fluids Use cough medicine to help loosen mucus and control cough Continue your Dulera inhaler, your Spiriva inhaler every day and albuterol as needed When you get home today, call your primary care doctor to set up an appointment.  You need to discuss with him getting a nebulizer for home use.  Also discussed with him a Prevnar pneumonia shot   ED Prescriptions    Medication Sig Dispense Auth. Provider   guaifenesin (ALTARUSSIN) 100 MG/5ML syrup Take 10 mLs (200 mg total) by mouth 3 (three) times daily as needed for cough. 355 mL Raylene Everts, MD   predniSONE (DELTASONE) 20 MG tablet Take 1 tablet (20 mg total) by mouth 2 (two) times daily with a meal. 10 tablet Raylene Everts, MD     Controlled Substance Prescriptions [place] Controlled Substance Registry consulted? Not Applicable   Raylene Everts, MD 07/13/18 1118

## 2018-07-13 NOTE — ED Notes (Signed)
Patient able to ambulate independently  

## 2018-07-21 ENCOUNTER — Encounter: Payer: Self-pay | Admitting: Family Medicine

## 2018-07-21 ENCOUNTER — Other Ambulatory Visit: Payer: Self-pay

## 2018-07-21 ENCOUNTER — Ambulatory Visit (INDEPENDENT_AMBULATORY_CARE_PROVIDER_SITE_OTHER): Payer: Medicaid Other | Admitting: Family Medicine

## 2018-07-21 VITALS — BP 130/80 | HR 100 | Temp 98.3°F | Ht 65.0 in | Wt 140.6 lb

## 2018-07-21 DIAGNOSIS — J432 Centrilobular emphysema: Secondary | ICD-10-CM

## 2018-07-21 MED ORDER — LOSARTAN POTASSIUM 50 MG PO TABS: 50.0000 mg | ORAL_TABLET | Freq: Every day | ORAL | 3 refills | Status: DC

## 2018-07-21 MED ORDER — ALBUTEROL SULFATE HFA 108 (90 BASE) MCG/ACT IN AERS: 1.0000 | INHALATION_SPRAY | RESPIRATORY_TRACT | 0 refills | Status: DC | PRN

## 2018-07-21 MED ORDER — ALBUTEROL SULFATE (2.5 MG/3ML) 0.083% IN NEBU
2.5000 mg | INHALATION_SOLUTION | Freq: Once | RESPIRATORY_TRACT | Status: AC
  Administered 2018-07-21: 2.5 mg via RESPIRATORY_TRACT

## 2018-07-21 MED ORDER — TIOTROPIUM BROMIDE MONOHYDRATE 18 MCG IN CAPS: 18.0000 ug | ORAL_CAPSULE | Freq: Every day | RESPIRATORY_TRACT | 0 refills | Status: DC

## 2018-07-21 MED ORDER — MOMETASONE FURO-FORMOTEROL FUM 200-5 MCG/ACT IN AERO: 2.0000 | INHALATION_SPRAY | Freq: Two times a day (BID) | RESPIRATORY_TRACT | 3 refills | Status: DC

## 2018-07-21 MED ORDER — ALBUTEROL SULFATE 0.63 MG/3ML IN NEBU: 1.0000 | INHALATION_SOLUTION | Freq: Four times a day (QID) | RESPIRATORY_TRACT | 12 refills | Status: DC | PRN

## 2018-07-21 NOTE — Patient Instructions (Addendum)
It was great to see you today! Thank you for letting me participate in your care!  Today, we discussed your continued cough and COPD. Please be sure to pick up your medications and take them as prescribed. I have listed below how to properly take them.  Albuterol inhaler/nebulizer - use as needed and at night when you are having difficulty breathing Spiriva - one puff every day Dulera - two puffs in the morning and two puffs in the evening every day  I have also changed your blood pressure medication. Please take it once daily and return for labs in one month   COPD and Physical Activity Chronic obstructive pulmonary disease (COPD) is a long-term (chronic) condition that affects the lungs. COPD is a general term that can be used to describe many different lung problems that cause lung swelling (inflammation) and limit airflow, including chronic bronchitis and emphysema. The main symptom of COPD is shortness of breath, which makes it harder to do even simple tasks. This can also make it harder to exercise and be active. Talk with your health care provider about treatments to help you breathe better and actions you can take to prevent breathing problems during physical activity. What are the benefits of exercising with COPD? Exercising regularly is an important part of a healthy lifestyle. You can still exercise and do physical activities even though you have COPD. Exercise and physical activity improve your shortness of breath by increasing blood flow (circulation). This causes your heart to pump more oxygen through your body. Moderate exercise can improve your:  Oxygen use.  Energy level.  Shortness of breath.  Strength in your breathing muscles.  Heart health.  Sleep.  Self-esteem and feelings of self-worth.  Depression, stress, and anxiety levels. Exercise can benefit everyone with COPD. The severity of your disease may affect how hard you can exercise, especially at first, but  everyone can benefit. Talk with your health care provider about how much exercise is safe for you, and which activities and exercises are safe for you. What actions can I take to prevent breathing problems during physical activity?  Sign up for a pulmonary rehabilitation program. This type of program may include: ? Education about lung diseases. ? Exercise classes that teach you how to exercise and be more active while improving your breathing. This usually involves:  Exercise using your lower extremities, such as a stationary bicycle.  About 30 minutes of exercise, 2 to 5 times per week, for 6 to 12 weeks  Strength training, such as push ups or leg lifts. ? Nutrition education. ? Group classes in which you can talk with others who also have COPD and learn ways to manage stress.  If you use an oxygen tank, you should use it while you exercise. Work with your health care provider to adjust your oxygen for your physical activity. Your resting flow rate is different from your flow rate during physical activity.  While you are exercising: ? Take slow breaths. ? Pace yourself and do not try to go too fast. ? Purse your lips while breathing out. Pursing your lips is similar to a kissing or whistling position. ? If doing exercise that uses a quick burst of effort, such as weight lifting:  Breathe in before starting the exercise.  Breathe out during the hardest part of the exercise (such as raising the weights). Where to find support You can find support for exercising with COPD from:  Your health care provider.  A pulmonary rehabilitation  program.  Your local health department or community health programs.  Support groups, online or in-person. Your health care provider may be able to recommend support groups. Where to find more information You can find more information about exercising with COPD from:  American Lung Association: ClassInsider.se.  COPD Foundation:  https://www.rivera.net/. Contact a health care provider if:  Your symptoms get worse.  You have chest pain.  You have nausea.  You have a fever.  You have trouble talking or catching your breath.  You want to start a new exercise program or a new activity. Summary  COPD is a general term that can be used to describe many different lung problems that cause lung swelling (inflammation) and limit airflow. This includes chronic bronchitis and emphysema.  Exercise and physical activity improve your shortness of breath by increasing blood flow (circulation). This causes your heart to provide more oxygen to your body.  Contact your health care provider before starting any exercise program or new activity. Ask your health care provider what exercises and activities are safe for you. This information is not intended to replace advice given to you by your health care provider. Make sure you discuss any questions you have with your health care provider. Document Released: 05/15/2017 Document Revised: 05/15/2017 Document Reviewed: 05/15/2017 Elsevier Interactive Patient Education  2019 [place] well, Harolyn Rutherford, DO PGY-2, [facility]

## 2018-07-21 NOTE — Progress Notes (Signed)
Subjective: Chief Complaint  Patient presents with  . Annual Exam     HPI: Gavin Kane is a 63 y.o. presenting to clinic today to discuss the following:  COPD Patient has cough but no fever and no URI symptoms. He has been out of his COPD medications and was recently hospitalized at [facility] for a COPD exacerbation. He is not short of breath today but he does complain of chest tightness and a dry cough. He is not struggling to breath and can walk normal distances without stopping. He wants refills of his meds.  Health Maintenance: Colonoscopy to be discussed at next appt.     ROS noted in HPI.   Past Medical, Surgical, Social, and Family History Reviewed & Updated per EMR.   Pertinent Historical Findings include:   Social History   Tobacco Use  Smoking Status Current Some Day Smoker  . Packs/day: 0.75  . Years: 40.00  . Pack years: 30.00  . Types: Cigarettes  Smokeless Tobacco Former Systems developer  . Quit date: 07/21/2012    Objective: BP 130/80   Pulse 100   Temp 98.3 F (36.8 C) (Oral)   Ht 5\' 5"  (1.651 m)   Wt 140 lb 9.6 oz (63.8 kg)   SpO2 96%   BMI 23.40 kg/m  Vitals and nursing notes reviewed  Physical Exam Gen: Alert and Oriented x 3, NAD HEENT: Normocephalic, atraumatic CV: RRR, no murmurs, normal S1, S2 split Resp: NWOB, mild wheezing in bibasilar lung fields, no crackles or rhonchi Ext: no clubbing, cyanosis, or edema Skin: warm, dry, intact, no rashes  No results found for this or any previous visit (from the past 72 hour(s)).  Assessment/Plan:  COPD (chronic obstructive pulmonary disease) SOB and dry cough most likely due to running out of controller medication for his COPD. Wells score of 0 and no chest pain with stable vitals making PE unlikely as well as cardiac source of chest tightness. - Refill of Dulera - Refill Spiriva - Refill of Albuterol prn - Set up with home nebulizer machine   PATIENT EDUCATION PROVIDED: See AVS     Diagnosis and plan along with any newly prescribed medication(s) were discussed in detail with this patient today. The patient verbalized understanding and agreed with the plan. Patient advised if symptoms worsen return to clinic or ER.   Orders Placed This Encounter  Procedures  . DME Nebulizer machine    Order Specific Question:   Patient needs a nebulizer to treat with the following condition    Answer:   COPD exacerbation ([place]) [706237]  . Basic Metabolic Panel    Standing Status:   Future    Standing Expiration Date:   01/21/2019    Meds ordered this encounter  Medications  . mometasone-formoterol (DULERA) 200-5 MCG/ACT AERO    Sig: Inhale 2 puffs into the lungs 2 (two) times daily.    Dispense:  1 Inhaler    Refill:  3  . tiotropium (SPIRIVA HANDIHALER) 18 MCG inhalation capsule    Sig: Place 1 capsule (18 mcg total) into inhaler and inhale daily.    Dispense:  60 capsule    Refill:  0  . albuterol (PROVENTIL HFA;VENTOLIN HFA) 108 (90 Base) MCG/ACT inhaler    Sig: Inhale 1-2 puffs into the lungs every 4 (four) hours as needed for wheezing or shortness of breath (or cough).    Dispense:  1 Inhaler    Refill:  0  . losartan (COZAAR) 50 MG tablet  Sig: Take 1 tablet (50 mg total) by mouth daily.    Dispense:  90 tablet    Refill:  3  . albuterol (ACCUNEB) 0.63 MG/3ML nebulizer solution    Sig: Take 3 mLs (0.63 mg total) by nebulization every 6 (six) hours as needed for wheezing.    Dispense:  75 mL    Refill:  12  . albuterol (PROVENTIL) (2.5 MG/3ML) 0.083% nebulizer solution 2.5 mg     Harolyn Rutherford, DO 07/21/2018, 1:41 PM PGY-2 [place]

## 2018-07-27 ENCOUNTER — Telehealth: Payer: Self-pay | Admitting: Family Medicine

## 2018-07-27 ENCOUNTER — Other Ambulatory Visit: Payer: Self-pay | Admitting: Family Medicine

## 2018-07-27 NOTE — Telephone Encounter (Signed)
Patient calling to get his nebulizer approved, but patient didn't know what company it was through. Please call patient back.

## 2018-07-27 NOTE — Telephone Encounter (Addendum)
Attempted to call patient to clarify his request for a nebulizer. There was no answer and then the operator says that the number has been disconnected or no longer in service.  Called patient's home and was given his correct mobile number [phone]. Updated  patient's chart with correct mobile number.  Will follow up with patient after I do some research. With [place].  Ozella Almond, [place]

## 2018-07-28 NOTE — Telephone Encounter (Signed)
Spoke to patient and informed him that I had spoken to [facility] and that there was not an order for a nebulizer.  Patient was irate and said that he was going to go to [facility] at [address] and "straighten it all out because he remembers giving them his order and was told that they needed to get in touch with Dr. Garlan Fillers prior to being able to give him a nebulizer."  Patient does not remember the name of the person he gave the order to.  Patient was advised that he could get a nebulizer through [facility]. Patient states that he already picked up albuterol form [facility] since the [facility] is closed.  Patient is determined to talk to [facility] and get a nebulizer through them.  Ozella Almond, [place]

## 2018-07-28 NOTE — Telephone Encounter (Signed)
[place] and spoke to [place] [place] concerning order for patient.   Lawerance Bach states that there was no order for a nebulizer in their system for patient.  She looked up patient by name and date of birth.  Will call patient with new information.  Ozella Almond, [place]

## 2018-07-31 NOTE — Assessment & Plan Note (Signed)
SOB and dry cough most likely due to running out of controller medication for his COPD. Wells score of 0 and no chest pain with stable vitals making PE unlikely as well as cardiac source of chest tightness. - Refill of Dulera - Refill Spiriva - Refill of Albuterol prn - Set up with home nebulizer machine

## 2018-08-13 ENCOUNTER — Other Ambulatory Visit: Payer: Self-pay | Admitting: Family Medicine

## 2018-09-29 ENCOUNTER — Other Ambulatory Visit: Payer: Self-pay

## 2018-09-29 ENCOUNTER — Encounter: Payer: Self-pay | Admitting: Emergency Medicine

## 2018-09-29 ENCOUNTER — Ambulatory Visit
Admission: EM | Admit: 2018-09-29 | Discharge: 2018-09-29 | Disposition: A | Discharge status: Home / Self Care | Payer: Medicaid Other

## 2018-09-29 DIAGNOSIS — I10 Essential (primary) hypertension: Secondary | ICD-10-CM

## 2018-09-29 DIAGNOSIS — S70362A Insect bite (nonvenomous), left thigh, initial encounter: Secondary | ICD-10-CM

## 2018-09-29 DIAGNOSIS — W57XXXA Bitten or stung by nonvenomous insect and other nonvenomous arthropods, initial encounter: Secondary | ICD-10-CM

## 2018-09-29 NOTE — Discharge Instructions (Addendum)
No alarming signs right now. Continue to monitor. If experiencing spreading redness, increased warmth, follow up for reevaluation needed. Also monitor for rashes, body aches, headaches, fever, follow up for reevaluation needed.

## 2018-09-29 NOTE — ED Provider Notes (Signed)
[facility]    CSN: [id number] Arrival date & time: 09/29/18  1739     History   Chief Complaint Chief Complaint  Patient presents with  . Insect Bite    HPI Gavin Kane is a 63 y.o. male.   63 year old male with history of COPD, HTN comes in for tick bite. States he felt a "sting" to the left inner thigh and saw a tick. He removed the tick on his own and came in for evaluation. He thinks the tick bit down during the "stinging" feeling. He denies having a tick yesterday. Denies going to the woods. Does have pets that play in the backyard. Denies rashes, fever, chills, body aches, headaches.      Past Medical History:  Diagnosis Date  . COPD (chronic obstructive pulmonary disease) ([place])   . HTN (hypertension)     Patient Active Problem List   Diagnosis Date Noted  . COPD exacerbation ([place]) 06/20/2018  . Cough 02/09/2018  . Encounter for smoking cessation counseling 11/17/2017  . Non compliance w medication regimen 04/19/2016  . Benzodiazepine abuse ([place]) 12/28/2015  . Anxiety state 05/18/2014  . Chronic fatigue 12/01/2012  . Tobacco abuse counseling 11/27/2012  . COPD (chronic obstructive pulmonary disease) ([place]) 10/13/2012  . DOE (dyspnea on exertion) 10/11/2012  . Nonspecific ST-T changes 10/11/2012  . Hypertension 10/11/2012    Past Surgical History:  Procedure Laterality Date  . HEMORRHOID SURGERY         Home Medications    Prior to Admission medications   Medication Sig Start Date End Date Taking? Authorizing Provider  albuterol (ACCUNEB) 0.63 MG/3ML nebulizer solution Take 3 mLs (0.63 mg total) by nebulization every 6 (six) hours as needed for wheezing. 07/21/18   Nuala Alpha, DO  guaifenesin (ALTARUSSIN) 100 MG/5ML syrup Take 10 mLs (200 mg total) by mouth 3 (three) times daily as needed for cough. 07/13/18   Raylene Everts, MD  losartan (COZAAR) 50 MG tablet Take 1 tablet (50 mg total) by mouth daily. 07/21/18   Lockamy, Christia Reading,  DO  mometasone-formoterol (DULERA) 200-5 MCG/ACT AERO Inhale 2 puffs into the lungs 2 (two) times daily. 07/21/18   Nuala Alpha, DO  predniSONE (DELTASONE) 20 MG tablet Take 1 tablet (20 mg total) by mouth 2 (two) times daily with a meal. 07/13/18   Raylene Everts, MD  PROAIR HFA 108 [phone] Base) MCG/ACT inhaler INHALE 1-2 PUFFS INTO THE LUNGS EVERY 4 HOURS AS NEEDED FOR WHEEZING OR SHORTNESS OF BREATH(OR COUGH) 08/13/18   Lockamy, Timothy, DO  tiotropium (SPIRIVA HANDIHALER) 18 MCG inhalation capsule Place 1 capsule (18 mcg total) into inhaler and inhale daily. 07/21/18   Nuala Alpha, DO    Family History Family History  Problem Relation Age of Onset  . Cancer Other        Aunt    Social History Social History   Tobacco Use  . Smoking status: Current Some Day Smoker    Packs/day: 0.75    Years: 40.00    Pack years: 30.00    Types: Cigarettes  . Smokeless tobacco: Former Systems developer    Quit date: 07/21/2012  Substance Use Topics  . Alcohol use: Yes    Alcohol/week: 0.0 standard drinks    Comment: Social  . Drug use: No     Allergies   Patient has no known allergies.   Review of Systems Review of Systems  Reason unable to perform ROS: See HPI as above.  Physical Exam Triage Vital Signs ED Triage Vitals  Enc Vitals Group     BP 09/29/18 1745 (!) 142/88     Pulse Rate 09/29/18 1745 (!) 101     Resp 09/29/18 1745 (!) 22     Temp 09/29/18 1745 98 F (36.7 C)     Temp Source 09/29/18 1745 Oral     SpO2 09/29/18 1745 94 %     Weight --      Height --      Head Circumference --      Peak Flow --      Pain Score 09/29/18 1746 1     Pain Loc --      Pain Edu? --      Excl. in [place]? --    No data found.  Updated Vital Signs BP (!) 142/88 (BP Location: Left Arm)   Pulse (!) 101   Temp 98 F (36.7 C) (Oral)   Resp (!) 22   SpO2 94%   Physical Exam Constitutional:      General: He is not in acute distress.    Appearance: He is well-developed. He is not  diaphoretic.  HENT:     Head: Normocephalic and atraumatic.  Eyes:     Conjunctiva/sclera: Conjunctivae normal.     Pupils: Pupils are equal, round, and reactive to light.  Skin:    General: Skin is warm and dry.     Comments: Pinpoint redness to the bite site at the left inner thigh. No swelling, warmth. No tenderness. No rashes seen.   Neurological:     Mental Status: He is alert and oriented to person, place, and time.      UC Treatments / Results  Labs (all labs ordered are listed, but only abnormal results are displayed) Labs Reviewed - No data to display  EKG None  Radiology No results found.  Procedures Procedures (including critical care time)  Medications Ordered in UC Medications - No data to display  Initial Impression / Assessment and Plan / UC Course  I have reviewed the triage vital signs and the nursing notes.  Pertinent labs & imaging results that were available during my care of the patient were reviewed by me and considered in my medical decision making (see chart for details).    Tick not engorged. Given most likely bit today, low suspicion of tick borne disease transmission. Will have patient monitor for symptoms at this time. return precautions given. Patient expresses understanding and agrees to plan.   Final Clinical Impressions(s) / UC Diagnoses   Final diagnoses:  Tick bite, initial encounter    ED Prescriptions    None        Ok Edwards, PA-C 09/29/18 1804

## 2018-09-29 NOTE — ED Triage Notes (Signed)
Pt presents after being bit by a tick a few minutes ago.  Pt states it took him approx 2 minutes to get him unhinged, tick is in a tissue with him.

## 2018-10-01 ENCOUNTER — Encounter: Payer: Self-pay | Admitting: Emergency Medicine

## 2018-10-01 ENCOUNTER — Ambulatory Visit
Admission: EM | Admit: 2018-10-01 | Discharge: 2018-10-01 | Disposition: A | Discharge status: Home / Self Care | Payer: Medicaid Other | Attending: Physician Assistant | Admitting: Physician Assistant

## 2018-10-01 ENCOUNTER — Other Ambulatory Visit: Payer: Self-pay

## 2018-10-01 DIAGNOSIS — W57XXXD Bitten or stung by nonvenomous insect and other nonvenomous arthropods, subsequent encounter: Secondary | ICD-10-CM | POA: Diagnosis not present

## 2018-10-01 DIAGNOSIS — T148XXA Other injury of unspecified body region, initial encounter: Secondary | ICD-10-CM

## 2018-10-01 MED ORDER — MUPIROCIN 2 % EX OINT: 1.0000 "application " | TOPICAL_OINTMENT | Freq: Two times a day (BID) | CUTANEOUS | 0 refills | Status: DC

## 2018-10-01 NOTE — ED Provider Notes (Signed)
[facility]    CSN: [id number] Arrival date & time: 10/01/18  1337     History   Chief Complaint Chief Complaint  Patient presents with  . Tick Removal    HPI Gavin Kane is a 63 y.o. male.   63 year old male returns for evaluation of his tick bite. He was seen 2 days ago, at the time, the tick was removed shortly prior to arrival. He denies any pain. However, noticed some redness to the area and came in for evaluation. Denies spreading redness, warmth. Denies fever, chills, night sweats. Has not tried anything for the symptoms.      Past Medical History:  Diagnosis Date  . COPD (chronic obstructive pulmonary disease) ([place])   . HTN (hypertension)     Patient Active Problem List   Diagnosis Date Noted  . COPD exacerbation ([place]) 06/20/2018  . Cough 02/09/2018  . Encounter for smoking cessation counseling 11/17/2017  . Non compliance w medication regimen 04/19/2016  . Benzodiazepine abuse ([place]) 12/28/2015  . Anxiety state 05/18/2014  . Chronic fatigue 12/01/2012  . Tobacco abuse counseling 11/27/2012  . COPD (chronic obstructive pulmonary disease) ([place]) 10/13/2012  . DOE (dyspnea on exertion) 10/11/2012  . Nonspecific ST-T changes 10/11/2012  . Hypertension 10/11/2012    Past Surgical History:  Procedure Laterality Date  . HEMORRHOID SURGERY         Home Medications    Prior to Admission medications   Medication Sig Start Date End Date Taking? Authorizing Provider  albuterol (ACCUNEB) 0.63 MG/3ML nebulizer solution Take 3 mLs (0.63 mg total) by nebulization every 6 (six) hours as needed for wheezing. 07/21/18   Nuala Alpha, DO  guaifenesin (ALTARUSSIN) 100 MG/5ML syrup Take 10 mLs (200 mg total) by mouth 3 (three) times daily as needed for cough. 07/13/18   Raylene Everts, MD  losartan (COZAAR) 50 MG tablet Take 1 tablet (50 mg total) by mouth daily. 07/21/18   Lockamy, Christia Reading, DO  mometasone-formoterol (DULERA) 200-5 MCG/ACT AERO Inhale 2  puffs into the lungs 2 (two) times daily. 07/21/18   Nuala Alpha, DO  mupirocin ointment (BACTROBAN) 2 % Apply 1 application topically 2 (two) times daily. 10/01/18   Tasia Catchings, Amy V, PA-C  predniSONE (DELTASONE) 20 MG tablet Take 1 tablet (20 mg total) by mouth 2 (two) times daily with a meal. 07/13/18   Raylene Everts, MD  PROAIR HFA 108 [phone] Base) MCG/ACT inhaler INHALE 1-2 PUFFS INTO THE LUNGS EVERY 4 HOURS AS NEEDED FOR WHEEZING OR SHORTNESS OF BREATH(OR COUGH) 08/13/18   Lockamy, Timothy, DO  tiotropium (SPIRIVA HANDIHALER) 18 MCG inhalation capsule Place 1 capsule (18 mcg total) into inhaler and inhale daily. 07/21/18   Nuala Alpha, DO    Family History Family History  Problem Relation Age of Onset  . Cancer Other        Aunt    Social History Social History   Tobacco Use  . Smoking status: Current Some Day Smoker    Packs/day: 0.75    Years: 40.00    Pack years: 30.00    Types: Cigarettes  . Smokeless tobacco: Former Systems developer    Quit date: 07/21/2012  Substance Use Topics  . Alcohol use: Yes    Alcohol/week: 0.0 standard drinks    Comment: Social  . Drug use: No     Allergies   Patient has no known allergies.   Review of Systems Review of Systems  Reason unable to perform ROS: See HPI as  above.     Physical Exam Triage Vital Signs ED Triage Vitals [10/01/18 1343]  Enc Vitals Group     BP (!) 153/89     Pulse Rate 98     Resp 16     Temp 98.4 F (36.9 C)     Temp Source Oral     SpO2 94 %     Weight      Height      Head Circumference      Peak Flow      Pain Score      Pain Loc      Pain Edu?      Excl. in [place]?    No data found.  Updated Vital Signs BP (!) 153/89 (BP Location: Left Arm)   Pulse 98   Temp 98.4 F (36.9 C) (Oral)   Resp 16   SpO2 94%   Physical Exam Constitutional:      General: He is not in acute distress.    Appearance: He is well-developed. He is not diaphoretic.  HENT:     Head: Normocephalic and atraumatic.  Eyes:      Conjunctiva/sclera: Conjunctivae normal.     Pupils: Pupils are equal, round, and reactive to light.  Skin:    General: Skin is warm and dry.     Comments: Picture below. Small surrounding erythema to the tick bite site. No warmth. No tenderness to palpation. No fluctuance felt.   Neurological:     Mental Status: He is alert and oriented to person, place, and time.         UC Treatments / Results  Labs (all labs ordered are listed, but only abnormal results are displayed) Labs Reviewed - No data to display  EKG None  Radiology No results found.  Procedures Procedures (including critical care time)  Medications Ordered in UC Medications - No data to display  Initial Impression / Assessment and Plan / UC Course  I have reviewed the triage vital signs and the nursing notes.  Pertinent labs & imaging results that were available during my care of the patient were reviewed by me and considered in my medical decision making (see chart for details).    No signs of infection. Can use bactroban for the next 2-3 days. Warm compress. Return precautions given.  Final Clinical Impressions(s) / UC Diagnoses   Final diagnoses:  Tick bite, subsequent encounter   ED Prescriptions    Medication Sig Dispense Auth. Provider   mupirocin ointment (BACTROBAN) 2 % Apply 1 application topically 2 (two) times daily. 22 g Tobin Chad, [place] 10/01/18 1355

## 2018-10-01 NOTE — ED Triage Notes (Signed)
PT presents to [facility] for assessment after being bitten by a tick 2 days ago.  Area is now reddened, and patient wants to be assessed for need for antibiotic.

## 2018-10-01 NOTE — Discharge Instructions (Signed)
No signs of infection at this time. Bactroban ointment for the next 2-3 days. Warm compress. Monitor for spreading redness, increased warmth, fever, follow up for reevaluation.

## 2018-10-01 NOTE — ED Notes (Signed)
Patient able to ambulate independently  

## 2018-10-04 ENCOUNTER — Other Ambulatory Visit: Payer: Self-pay | Admitting: Family Medicine

## 2018-10-07 ENCOUNTER — Telehealth: Payer: Self-pay | Admitting: Family Medicine

## 2018-10-07 NOTE — Telephone Encounter (Signed)
Patient needs the 'red' inhaler filled to [facility] on [address].  He needs asap bc he has to use it more because wearing a mask is hard on his asthma.  (Patient is leaving town for 3 weeks on 10/18/18 and needs enough to use while gone), [facility] # [phone]. Patient has been out since 10/04/18.

## 2018-10-08 ENCOUNTER — Other Ambulatory Visit: Payer: Self-pay | Admitting: Family Medicine

## 2018-10-08 MED ORDER — ALBUTEROL SULFATE HFA 108 (90 BASE) MCG/ACT IN AERS: INHALATION_SPRAY | RESPIRATORY_TRACT | 2 refills | Status: DC

## 2018-10-08 NOTE — Progress Notes (Signed)
Sending albuterol to pharmacy as requested

## 2018-11-18 ENCOUNTER — Ambulatory Visit (INDEPENDENT_AMBULATORY_CARE_PROVIDER_SITE_OTHER): Payer: Medicaid Other | Admitting: Family Medicine

## 2018-11-18 ENCOUNTER — Other Ambulatory Visit: Payer: Self-pay

## 2018-11-18 VITALS — BP 122/80 | HR 85

## 2018-11-18 DIAGNOSIS — Z0000 Encounter for general adult medical examination without abnormal findings: Secondary | ICD-10-CM

## 2018-11-18 DIAGNOSIS — J432 Centrilobular emphysema: Secondary | ICD-10-CM | POA: Diagnosis not present

## 2018-11-18 MED ORDER — DULERA 200-5 MCG/ACT IN AERO: 2.0000 | INHALATION_SPRAY | Freq: Two times a day (BID) | RESPIRATORY_TRACT | 1 refills | Status: DC

## 2018-11-18 MED ORDER — ALBUTEROL SULFATE HFA 108 (90 BASE) MCG/ACT IN AERS: INHALATION_SPRAY | RESPIRATORY_TRACT | 2 refills | Status: DC

## 2018-11-18 MED ORDER — SPIRIVA HANDIHALER 18 MCG IN CAPS: 18.0000 ug | ORAL_CAPSULE | Freq: Every day | RESPIRATORY_TRACT | 0 refills | Status: DC

## 2018-11-18 NOTE — Progress Notes (Signed)
Subjective: Chief Complaint  Patient presents with  . Medication Refill    HPI: Gavin Kane is a 63 y.o. presenting to clinic today to discuss the following:  COPD Follow Up Patient presents for follow up for his COPD. He states sometimes his breathing is "rough". He states he is using Albuterol "almost every day". He states he has been using Spiriva once daily in the morning and Dulera two puffs twice day. He states he is unsure if he is still able to walk to same distance as he used to without getting short of breath. He still smoking and is not ready quit.  He states he needs refills on his medications.  He also wants a referral for a colonoscopy.  ROS noted in HPI.   Past Medical, Surgical, Social, and Family History Reviewed & Updated per EMR.   Pertinent Historical Findings include:   Social History   Tobacco Use  Smoking Status Current Some Day Smoker  . Packs/day: 0.75  . Years: 40.00  . Pack years: 30.00  . Types: Cigarettes  Smokeless Tobacco Former Systems developer  . 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 Patient returns due to being almost out of his medications. Overall he states he feels his breathing is getting worse and he is still a smoker. No desire to quit today as he feels like any attempt will not be successful. - Repeat Spirometry to see if lung function has decreased; if so I will increase his controller medications - Refills on Albuterol, Spiriva, and Dulera - Smoking cessation is the most important thing for him at this point. We  discussed this and I am hopeful during his appointment with Dr. Valentina Lucks of Spirometry he will be open to setting up an additional appointment for smoking cessation with him.   PATIENT EDUCATION PROVIDED: See AVS    Diagnosis and plan along with any newly prescribed medication(s) were discussed in detail with this patient today. The patient verbalized understanding and agreed with the plan. Patient advised if symptoms worsen return to clinic or ER.    Orders Placed This Encounter  Procedures  . Spirometry with graph    Standing Status:   Future    Standing Expiration Date:   02/18/2019    Order Specific Question:   Where should this test be performed?    Answer:   Other    Order Specific Question:   Basic spirometry    Answer:   Yes    Order Specific Question:   Spirometry pre & post bronchodilator    Answer:   Yes    Meds ordered this encounter  Medications  . albuterol (VENTOLIN HFA) 108 (90 Base) MCG/ACT inhaler    Sig: INHALE 1 TO 2 PUFFS INTO THE LUNGS EVERY 4 HOURS AS NEEDED FOR WHEEZING OR SHORTNESS OF BREATH OR COUGH    Dispense:  8.5 g    Refill:  2  . mometasone-formoterol (DULERA) 200-5 MCG/ACT AERO    Sig: Inhale 2 puffs into the lungs 2 (two) times daily.    Dispense:  13  g    Refill:  1  . tiotropium (SPIRIVA HANDIHALER) 18 MCG inhalation capsule    Sig: Place 1 capsule (18 mcg total) into inhaler and inhale daily.    Dispense:  60 capsule    Refill:  0     Tim Garlan Fillers, DO 11/18/2018, 9:13 AM PGY-3 [place]

## 2018-11-18 NOTE — Patient Instructions (Signed)
It was great to see you today! Thank you for letting me participate in your care!  Today, we discussed your COPD/emphysema. The best thing you can do is to stop smoking. I have sent in the refills for your medications.   I will have your return for an assessment of your lung function with Dr. Valentina Lucks. Please schedule this before you leave.  Be well, Gavin Rutherford, DO PGY-3, [facility]

## 2018-11-20 ENCOUNTER — Encounter: Payer: Self-pay | Admitting: Family Medicine

## 2018-11-20 DIAGNOSIS — Z136 Encounter for screening for cardiovascular disorders: Secondary | ICD-10-CM | POA: Insufficient documentation

## 2018-11-20 DIAGNOSIS — Z0000 Encounter for general adult medical examination without abnormal findings: Secondary | ICD-10-CM | POA: Insufficient documentation

## 2018-11-20 NOTE — Assessment & Plan Note (Signed)
Referral for colonoscopy °

## 2018-11-20 NOTE — Assessment & Plan Note (Signed)
Known emphysema type. Patient returns due to being almost out of his medications. Overall he states he feels his breathing is getting worse and he is still a smoker. No desire to quit today as he feels like any attempt will not be successful. - Repeat Spirometry to see if lung function has decreased; if so I will increase his controller medications - Refills on Albuterol, Spiriva, and Dulera - Smoking cessation is the most important thing for him at this point. We discussed this and I am hopeful during his appointment with Dr. Valentina Lucks of Spirometry he will be open to setting up an additional appointment for smoking cessation with him.

## 2018-11-23 ENCOUNTER — Other Ambulatory Visit: Payer: Self-pay

## 2018-11-23 ENCOUNTER — Ambulatory Visit
Admission: EM | Admit: 2018-11-23 | Discharge: 2018-11-23 | Disposition: A | Discharge status: Home / Self Care | Payer: Medicaid Other | Attending: Physician Assistant | Admitting: Physician Assistant

## 2018-11-23 DIAGNOSIS — R05 Cough: Secondary | ICD-10-CM

## 2018-11-23 DIAGNOSIS — R0602 Shortness of breath: Secondary | ICD-10-CM

## 2018-11-23 DIAGNOSIS — R059 Cough, unspecified: Secondary | ICD-10-CM

## 2018-11-23 MED ORDER — IPRATROPIUM BROMIDE 0.06 % NA SOLN: 2.0000 | Freq: Four times a day (QID) | NASAL | 12 refills | Status: DC

## 2018-11-23 MED ORDER — PREDNISONE 50 MG PO TABS: 50.0000 mg | ORAL_TABLET | Freq: Every day | ORAL | 0 refills | Status: DC

## 2018-11-23 MED ORDER — DOXYCYCLINE HYCLATE 100 MG PO CAPS: 100.0000 mg | ORAL_CAPSULE | Freq: Two times a day (BID) | ORAL | 0 refills | Status: DC

## 2018-11-23 NOTE — ED Provider Notes (Signed)
[facility]    CSN: [id number] Arrival date & time: 11/23/18  1320     History   Chief Complaint Chief Complaint  Patient presents with  . Cough    HPI Justn Quale is a 63 y.o. male.   63 year old male comes in for few day history of shortness of breath, cough. He has mild throat irritation. Denies rhinorrhea, nasal congestion. Denies abdominal pain, nausea, vomiting. Denies fever, chills, body aches. Denies loss of taste/smell. He denies any sick contact/COVID contact. However, he went to [place] by driving about 3 weeks ago. He saw his PCP 5 days ago without any symptoms. He has been using inhalers with some relief.      Past Medical History:  Diagnosis Date  . COPD (chronic obstructive pulmonary disease) ([place])   . HTN (hypertension)     Patient Active Problem List   Diagnosis Date Noted  . Healthcare maintenance 11/20/2018  . Screening for AAA (abdominal aortic aneurysm) 11/20/2018  . COPD exacerbation ([place]) 06/20/2018  . Cough 02/09/2018  . Encounter for smoking cessation counseling 11/17/2017  . Non compliance w medication regimen 04/19/2016  . Benzodiazepine abuse ([place]) 12/28/2015  . Anxiety state 05/18/2014  . Chronic fatigue 12/01/2012  . Tobacco abuse counseling 11/27/2012  . COPD (chronic obstructive pulmonary disease) ([place]) 10/13/2012  . DOE (dyspnea on exertion) 10/11/2012  . Nonspecific ST-T changes 10/11/2012  . Hypertension 10/11/2012    Past Surgical History:  Procedure Laterality Date  . HEMORRHOID SURGERY         Home Medications    Prior to Admission medications   Medication Sig Start Date End Date Taking? Authorizing Provider  albuterol (ACCUNEB) 0.63 MG/3ML nebulizer solution Take 3 mLs (0.63 mg total) by nebulization every 6 (six) hours as needed for wheezing. 07/21/18   Lockamy, Christia Reading, DO  albuterol (VENTOLIN HFA) 108 (90 Base) MCG/ACT inhaler INHALE 1 TO 2 PUFFS INTO THE LUNGS EVERY 4 HOURS AS NEEDED FOR WHEEZING OR  SHORTNESS OF BREATH OR COUGH 11/18/18   Nuala Alpha, DO  doxycycline (VIBRAMYCIN) 100 MG capsule Take 1 capsule (100 mg total) by mouth 2 (two) times daily. 11/23/18   Tasia Catchings, Marilea Gwynne V, PA-C  guaifenesin (ALTARUSSIN) 100 MG/5ML syrup Take 10 mLs (200 mg total) by mouth 3 (three) times daily as needed for cough. 07/13/18   Raylene Everts, MD  ipratropium (ATROVENT) 0.06 % nasal spray Place 2 sprays into both nostrils 4 (four) times daily. 11/23/18   Tasia Catchings, Kynslie Ringle V, PA-C  losartan (COZAAR) 50 MG tablet Take 1 tablet (50 mg total) by mouth daily. 07/21/18   Lockamy, Christia Reading, DO  mometasone-formoterol (DULERA) 200-5 MCG/ACT AERO Inhale 2 puffs into the lungs 2 (two) times daily. 11/18/18   Nuala Alpha, DO  mupirocin ointment (BACTROBAN) 2 % Apply 1 application topically 2 (two) times daily. 10/01/18   Tasia Catchings, Jeshawn Melucci V, PA-C  predniSONE (DELTASONE) 50 MG tablet Take 1 tablet (50 mg total) by mouth daily with breakfast. 11/23/18   Tasia Catchings, Odaly Peri V, PA-C  tiotropium (SPIRIVA HANDIHALER) 18 MCG inhalation capsule Place 1 capsule (18 mcg total) into inhaler and inhale daily. 11/18/18   Nuala Alpha, DO    Family History Family History  Problem Relation Age of Onset  . Cancer Other        Aunt    Social History Social History   Tobacco Use  . Smoking status: Current Some Day Smoker    Packs/day: 0.75    Years: 40.00  Pack years: 30.00    Types: Cigarettes  . Smokeless tobacco: Former Systems developer    Quit date: 07/21/2012  Substance Use Topics  . Alcohol use: Not Currently    Alcohol/week: 0.0 standard drinks    Comment: Social  . Drug use: No     Allergies   Patient has no known allergies.   Review of Systems Review of Systems  Reason unable to perform ROS: See HPI as above.     Physical Exam Triage Vital Signs ED Triage Vitals  Enc Vitals Group     BP 11/23/18 1327 137/84     Pulse Rate 11/23/18 1327 88     Resp 11/23/18 1327 18     Temp 11/23/18 1327 98.5 F (36.9 C)     Temp Source 11/23/18  1327 Oral     SpO2 11/23/18 1327 96 %     Weight --      Height --      Head Circumference --      Peak Flow --      Pain Score 11/23/18 1328 0     Pain Loc --      Pain Edu? --      Excl. in [place]? --    No data found.  Updated Vital Signs BP 137/84 (BP Location: Left Arm)   Pulse 88   Temp 98.5 F (36.9 C) (Oral)   Resp 18   SpO2 96%   Physical Exam Constitutional:      General: He is not in acute distress.    Appearance: Normal appearance. He is not ill-appearing, toxic-appearing or diaphoretic.  HENT:     Head: Normocephalic and atraumatic.     Mouth/Throat:     Mouth: Mucous membranes are moist.     Pharynx: Oropharynx is clear. Uvula midline.  Neck:     Musculoskeletal: Normal range of motion and neck supple.  Cardiovascular:     Rate and Rhythm: Normal rate and regular rhythm.     Heart sounds: Normal heart sounds. No murmur. No friction rub. No gallop.   Pulmonary:     Effort: Pulmonary effort is normal. No accessory muscle usage, prolonged expiration, respiratory distress or retractions.     Comments: Speaking in full sentences without difficulty. Lungs clear to auscultation without adventitious lung sounds. Neurological:     General: No focal deficit present.     Mental Status: He is alert and oriented to person, place, and time.      UC Treatments / Results  Labs (all labs ordered are listed, but only abnormal results are displayed) Labs Reviewed - No data to display  EKG   Radiology No results found.  Procedures Procedures (including critical care time)  Medications Ordered in UC Medications - No data to display  Initial Impression / Assessment and Plan / UC Course  I have reviewed the triage vital signs and the nursing notes.  Pertinent labs & imaging results that were available during my care of the patient were reviewed by me and considered in my medical decision making (see chart for details).    Will cover for COPD exacerbation with  prednisone. Written Rx of doxycycline provided, can start if symptoms not improving. Other symptomatic treatment discussed. Will test for COVID given recent travel. Return precautions given. Patient expresses understanding and agrees to plan.  Final Clinical Impressions(s) / UC Diagnoses   Final diagnoses:  Cough  Shortness of breath    ED Prescriptions    Medication Sig Dispense  Auth. Provider   predniSONE (DELTASONE) 50 MG tablet Take 1 tablet (50 mg total) by mouth daily with breakfast. 5 tablet Caoimhe Damron V, PA-C   ipratropium (ATROVENT) 0.06 % nasal spray Place 2 sprays into both nostrils 4 (four) times daily. 15 mL Cianna Kasparian V, PA-C   doxycycline (VIBRAMYCIN) 100 MG capsule Take 1 capsule (100 mg total) by mouth 2 (two) times daily. 20 capsule Tobin Chad, [place] 11/23/18 1512

## 2018-11-23 NOTE — Discharge Instructions (Signed)
Start prednisone and atrovent as directed. COVID testing pending. I would like you to quarantine until testing results. If experiencing shortness of breath, trouble breathing, call 911 and provide them with your current situation.   If experiencing worsening cough, start doxycycline as directed.

## 2018-11-23 NOTE — ED Triage Notes (Signed)
Pt c/o productive cough since Friday, causing his SOB. Pt speaking in complete sentences

## 2018-11-26 ENCOUNTER — Ambulatory Visit (INDEPENDENT_AMBULATORY_CARE_PROVIDER_SITE_OTHER): Payer: Medicaid Other | Admitting: Pharmacist

## 2018-11-26 ENCOUNTER — Encounter: Payer: Self-pay | Admitting: Pharmacist

## 2018-11-26 ENCOUNTER — Other Ambulatory Visit: Payer: Self-pay

## 2018-11-26 DIAGNOSIS — Z716 Tobacco abuse counseling: Secondary | ICD-10-CM

## 2018-11-26 DIAGNOSIS — I10 Essential (primary) hypertension: Secondary | ICD-10-CM | POA: Diagnosis not present

## 2018-11-26 DIAGNOSIS — J432 Centrilobular emphysema: Secondary | ICD-10-CM

## 2018-11-26 LAB — NOVEL CORONAVIRUS, NAA: SARS-CoV-2, NAA: NOT DETECTED

## 2018-11-26 MED ORDER — VARENICLINE TARTRATE 1 MG PO TABS: 1.0000 mg | ORAL_TABLET | Freq: Two times a day (BID) | ORAL | 3 refills | Status: DC

## 2018-11-26 MED ORDER — LOSARTAN POTASSIUM 100 MG PO TABS: 100.0000 mg | ORAL_TABLET | Freq: Every day | ORAL | 3 refills | Status: DC

## 2018-11-26 MED ORDER — CHANTIX STARTING MONTH PAK 0.5 MG X 11 & 1 MG X 42 PO TABS: ORAL_TABLET | ORAL | 0 refills | Status: DC

## 2018-11-26 NOTE — Progress Notes (Signed)
   S:    Patient arrives in good spirits. Presents for lung function evaluation.    Patient was referred and last seen by Primary Care Provider on 11/18/2018  Patient reports breathing has been problematic for 4-5 years.   Patient reports adherence to medications Patient reports last dose of COPD medications was albuterol (2x today) and Spiriva and Dulera this AM.  Current COPD medications: Ventolin (albuterol), Dulera (mometasone/formoterol), and Spiriva (tiotropium) Rescue inhaler use frequency: ~4 times daily Patient exacerbation hx: multiple in the last year  O: Physical Exam Vitals signs reviewed.  Constitutional:      Appearance: Normal appearance. He is normal weight.  Pulmonary:     Effort: Pulmonary effort is normal.  Neurological:     Mental Status: He is alert.  Psychiatric:        Mood and Affect: Mood normal.    Review of Systems  Respiratory: Positive for cough, sputum production and shortness of breath.   Cardiovascular: Negative for leg swelling.  All other systems reviewed and are negative.    Vitals:   11/26/18 1154  BP: (!) 156/96  Pulse: 87  SpO2: 96%    mMRC score= >2 CAT score= 22 See Documentation Flowsheet - CAT/COPD for complete symptom scoring.  See "scanned report" or Documentation Flowsheet (discrete results - PFTs) for Spirometry results. Patient provided good effort while attempting spirometry.   Lung Age = 142  A/P: COPD: Patient has been experiencing SOB for a few years and is currently experiencing cough and excess phlegm in lungs.  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 Spirometry indicative of non-reversible obstructive lung disease. Spirometry GOLD Treatment Group D based on CAT score and exacerbations in the last year.  Patient with fair inhaler technique. -Continue  Ventolin (albuterol), Dulera (mometasone/formoterol), and Spiriva (tiotropium) -Reviewed results of  pulmonary function tests.  Pt verbalized understanding of results and education.   Tobacco Abuse - chronic use of ~ 0.5 ppd willing to attempt use following Spirometry result of Lung - 142.  Initiated Chantix (varenicline) starter pack.   Follow up by phone in 10 days.   Hypertension longstanding and greater than goal.  Increased Losartan from 50mg  to 100mg  daily.  New Rx provided.   Written pt instructions provided.  F/U Clinic visit 6-8 weeks with PCP   Total time in face to face counseling 65 minutes.  Patient seen with Acie Fredrickson, PharmD Candidate. Marland Kitchen

## 2018-11-26 NOTE — Assessment & Plan Note (Signed)
COPD: Patient has been experiencing SOB for a few years and is currently experiencing cough and excess phlegm in lungs.  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 Spirometry indicative of non-reversible obstructive lung disease. Spirometry GOLD Treatment Group D based on CAT score and exacerbations in the last year.  Patient with fair inhaler technique. -Continue  Ventolin (albuterol), Dulera (mometasone/formoterol), and Spiriva (tiotropium) -Reviewed results of pulmonary function tests.  Pt verbalized understanding of results and education.

## 2018-11-26 NOTE — Assessment & Plan Note (Signed)
Tobacco Abuse - chronic use of ~ 0.5 ppd willing to attempt use following Spirometry result of Lung - 142.  Initiated Chantix (varenicline) starter pack.   Follow up by phone in 10 days.

## 2018-11-26 NOTE — Patient Instructions (Signed)
Great to meet you today.    Your lung function test showed Severe Obstruction and your lung age was older than 100 years.   Continue all inhalers as previously dosed.  Please remember to exhale completely prior to administration.   Start Chantix using the starting month pack.  Follow the dosing on the packaging.    Nausea is the main side effect   Please call the 1800 QUIT LINE and enroll in the program.

## 2018-11-27 NOTE — Progress Notes (Signed)
Reviewed: Agree with Dr. Koval's documentation and management. 

## 2018-11-28 ENCOUNTER — Other Ambulatory Visit: Payer: Self-pay

## 2018-11-28 ENCOUNTER — Ambulatory Visit
Admission: EM | Admit: 2018-11-28 | Discharge: 2018-11-28 | Disposition: A | Discharge status: Home / Self Care | Payer: Medicaid Other | Attending: Physician Assistant | Admitting: Physician Assistant

## 2018-11-28 DIAGNOSIS — I10 Essential (primary) hypertension: Secondary | ICD-10-CM | POA: Diagnosis not present

## 2018-11-28 DIAGNOSIS — R0989 Other specified symptoms and signs involving the circulatory and respiratory systems: Secondary | ICD-10-CM

## 2018-11-28 MED ORDER — AZELASTINE HCL 0.1 % NA SOLN: 2.0000 | Freq: Two times a day (BID) | NASAL | 0 refills | Status: DC

## 2018-11-28 MED ORDER — GUAIFENESIN ER 600 MG PO TB12: 600.0000 mg | ORAL_TABLET | Freq: Two times a day (BID) | ORAL | 0 refills | Status: AC

## 2018-11-28 NOTE — ED Triage Notes (Signed)
Per pt he was here Monday had congestion in chest and now it has gotten thicker and now yellow. No cough, no chest pain no sob, no fever

## 2018-11-28 NOTE — Discharge Instructions (Addendum)
Start azelastine nasal spray. Mucinex as directed. You can use albuterol neb to help with symptoms. Steam showers. Follow up with lung doctor if symptoms not improving.

## 2018-11-28 NOTE — ED Provider Notes (Signed)
[facility]    CSN: [id number] Arrival date & time: 11/28/18  1340     History   Chief Complaint Chief Complaint  Patient presents with  . Nasal Congestion    HPI Gavin Kane is a 63 y.o. male.   64 year old male comes in for thickening mucus for the past few days. He was seen 11/23/2018 for similar and was treated with doxycycline, prednisone, atrovent nasal spray. He was seen by PCP 11/26/2018 and was told to have severe COPD. He was put on chantix and discussed smoking cessation. He denies worsening of cough. Denies fever. Denies shortness of breath. States only changes from 7/20 is that mucus is not more thick and is yellow. He has finished his course of prednisone and is currently on doxycycline. He has been using atrovent nasal spray with some relief. Mucous worse at night.      Past Medical History:  Diagnosis Date  . COPD (chronic obstructive pulmonary disease) ([place])   . HTN (hypertension)     Patient Active Problem List   Diagnosis Date Noted  . Healthcare maintenance 11/20/2018  . Screening for AAA (abdominal aortic aneurysm) 11/20/2018  . COPD exacerbation ([place]) 06/20/2018  . Cough 02/09/2018  . Encounter for smoking cessation counseling 11/17/2017  . Non compliance w medication regimen 04/19/2016  . Benzodiazepine abuse ([place]) 12/28/2015  . Anxiety state 05/18/2014  . Chronic fatigue 12/01/2012  . Tobacco abuse counseling 11/27/2012  . COPD (chronic obstructive pulmonary disease) ([place]) 10/13/2012  . DOE (dyspnea on exertion) 10/11/2012  . Nonspecific ST-T changes 10/11/2012  . Hypertension 10/11/2012    Past Surgical History:  Procedure Laterality Date  . HEMORRHOID SURGERY         Home Medications    Prior to Admission medications   Medication Sig Start Date End Date Taking? Authorizing Provider  albuterol (ACCUNEB) 0.63 MG/3ML nebulizer solution Take 3 mLs (0.63 mg total) by nebulization every 6 (six) hours as needed for wheezing.  07/21/18   Lockamy, Christia Reading, DO  albuterol (VENTOLIN HFA) 108 (90 Base) MCG/ACT inhaler INHALE 1 TO 2 PUFFS INTO THE LUNGS EVERY 4 HOURS AS NEEDED FOR WHEEZING OR SHORTNESS OF BREATH OR COUGH 11/18/18   Lockamy, Timothy, DO  azelastine (ASTELIN) 0.1 % nasal spray Place 2 sprays into both nostrils 2 (two) times daily. Use in each nostril as directed 11/28/18   Tasia Catchings, Amy V, PA-C  doxycycline (VIBRAMYCIN) 100 MG capsule Take 1 capsule (100 mg total) by mouth 2 (two) times daily. 11/23/18   Tasia Catchings, Amy V, PA-C  guaiFENesin (MUCINEX) 600 MG 12 hr tablet Take 1 tablet (600 mg total) by mouth 2 (two) times daily for 7 days. 11/28/18 12/05/18  Tasia Catchings, Amy V, PA-C  ipratropium (ATROVENT) 0.06 % nasal spray Place 2 sprays into both nostrils 4 (four) times daily. 11/23/18   Tasia Catchings, Amy V, PA-C  losartan (COZAAR) 100 MG tablet Take 1 tablet (100 mg total) by mouth at bedtime. 11/26/18   Zenia Resides, MD  mometasone-formoterol (DULERA) 200-5 MCG/ACT AERO Inhale 2 puffs into the lungs 2 (two) times daily. 11/18/18   Nuala Alpha, DO  mupirocin ointment (BACTROBAN) 2 % Apply 1 application topically 2 (two) times daily. 10/01/18   Tasia Catchings, Amy V, PA-C  predniSONE (DELTASONE) 50 MG tablet Take 1 tablet (50 mg total) by mouth daily with breakfast. 11/23/18   Tasia Catchings, Amy V, PA-C  tiotropium (SPIRIVA HANDIHALER) 18 MCG inhalation capsule Place 1 capsule (18 mcg total) into inhaler and inhale  daily. 11/18/18   Nuala Alpha, DO  varenicline (CHANTIX CONTINUING MONTH PAK) 1 MG tablet Take 1 tablet (1 mg total) by mouth 2 (two) times daily. 11/26/18   Zenia Resides, MD  varenicline (CHANTIX STARTING MONTH PAK) 0.5 MG X 11 & 1 MG X 42 tablet Take one 0.5 mg tablet by mouth once daily for 3 days, then increase to one 0.5 mg tablet twice daily for 4 days, then increase to one 1 mg tablet twice daily. 11/26/18   Zenia Resides, MD    Family History Family History  Problem Relation Age of Onset  . Cancer Other        Aunt    Social History  Social History   Tobacco Use  . Smoking status: Current Some Day Smoker    Packs/day: 0.50    Years: 48.00    Pack years: 24.00    Types: Cigarettes    Start date: 05/06/1970  . Smokeless tobacco: Former Systems developer  . Tobacco comment: 7-8 per day   Substance Use Topics  . Alcohol use: Not Currently    Alcohol/week: 0.0 standard drinks    Comment: Social  . Drug use: No     Allergies   Patient has no known allergies.   Review of Systems Review of Systems  Reason unable to perform ROS: See HPI as above.     Physical Exam Triage Vital Signs ED Triage Vitals  Enc Vitals Group     BP 11/28/18 1347 (!) 164/94     Pulse Rate 11/28/18 1347 92     Resp 11/28/18 1347 16     Temp 11/28/18 1347 98.6 F (37 C)     Temp Source 11/28/18 1347 Oral     SpO2 11/28/18 1347 96 %     Weight --      Height --      Head Circumference --      Peak Flow --      Pain Score 11/28/18 1348 0     Pain Loc --      Pain Edu? --      Excl. in [place]? --    No data found.  Updated Vital Signs BP (!) 164/94 (BP Location: Right Arm)   Pulse 92   Temp 98.6 F (37 C) (Oral)   Resp 16   SpO2 96%   Physical Exam Constitutional:      General: He is not in acute distress.    Appearance: Normal appearance. He is not ill-appearing, toxic-appearing or diaphoretic.  HENT:     Head: Normocephalic and atraumatic.     Mouth/Throat:     Mouth: Mucous membranes are moist.     Pharynx: Oropharynx is clear. Uvula midline.  Neck:     Musculoskeletal: Normal range of motion and neck supple.  Cardiovascular:     Rate and Rhythm: Normal rate and regular rhythm.     Heart sounds: Normal heart sounds. No murmur. No friction rub. No gallop.   Pulmonary:     Effort: Pulmonary effort is normal. No accessory muscle usage, prolonged expiration, respiratory distress or retractions.     Comments: Lungs clear to auscultation without adventitious lung sounds. Neurological:     General: No focal deficit present.      Mental Status: He is alert and oriented to person, place, and time.    UC Treatments / Results  Labs (all labs ordered are listed, but only abnormal results are displayed) Labs Reviewed - No data  to display  EKG   Radiology No results found.  Procedures Procedures (including critical care time)  Medications Ordered in UC Medications - No data to display  Initial Impression / Assessment and Plan / UC Course  I have reviewed the triage vital signs and the nursing notes.  Pertinent labs & imaging results that were available during my care of the patient were reviewed by me and considered in my medical decision making (see chart for details).    Continue doxycycline. Add azelastine as directed. Mucinex as needed. Return precautions given. Patient expresses understanding and agrees to plan.  Final Clinical Impressions(s) / UC Diagnoses   Final diagnoses:  Chest congestion   ED Prescriptions    Medication Sig Dispense Auth. Provider   azelastine (ASTELIN) 0.1 % nasal spray Place 2 sprays into both nostrils 2 (two) times daily. Use in each nostril as directed 30 mL Yu, Amy V, PA-C   guaiFENesin (MUCINEX) 600 MG 12 hr tablet Take 1 tablet (600 mg total) by mouth 2 (two) times daily for 7 days. 14 tablet Tobin Chad, [place] 11/28/18 [phone]

## 2018-12-09 ENCOUNTER — Telehealth: Payer: Self-pay | Admitting: Pharmacist

## 2018-12-09 NOTE — Telephone Encounter (Signed)
-----   Message from Leavy Cella, [facility] sent at 11/26/2018  5:03 PM EDT ----- Regarding: Tobacco Cessation F/U

## 2018-12-09 NOTE — Telephone Encounter (Signed)
Follow-Up on Tobacco Cessation attempt.   Patient reported ER visit for increased chest congestion and states he has been improving since that time.  He continues to have congestion at this time.   Cut down from 10 to 6 on cigarettes per day.   Willing to start chantix.  Reviewed proper use and potential adverse effects of nausea.  Following instruction patient verbalized understanding of treatment plan.   Follow Up with PCP in 1 week.  Reassess at that time.   Reevaluate BP with losartan 100mg  daily at that time as well.

## 2018-12-16 ENCOUNTER — Ambulatory Visit (INDEPENDENT_AMBULATORY_CARE_PROVIDER_SITE_OTHER): Payer: Medicaid Other | Admitting: Family Medicine

## 2018-12-16 ENCOUNTER — Encounter: Payer: Self-pay | Admitting: Family Medicine

## 2018-12-16 ENCOUNTER — Other Ambulatory Visit: Payer: Self-pay

## 2018-12-16 VITALS — BP 120/80 | HR 96

## 2018-12-16 DIAGNOSIS — Z716 Tobacco abuse counseling: Secondary | ICD-10-CM

## 2018-12-16 DIAGNOSIS — I10 Essential (primary) hypertension: Secondary | ICD-10-CM

## 2018-12-16 DIAGNOSIS — J432 Centrilobular emphysema: Secondary | ICD-10-CM | POA: Diagnosis not present

## 2018-12-16 MED ORDER — ALBUTEROL SULFATE HFA 108 (90 BASE) MCG/ACT IN AERS: INHALATION_SPRAY | RESPIRATORY_TRACT | 3 refills | Status: DC

## 2018-12-16 MED ORDER — DULERA 200-5 MCG/ACT IN AERO: 2.0000 | INHALATION_SPRAY | Freq: Two times a day (BID) | RESPIRATORY_TRACT | 3 refills | Status: DC

## 2018-12-16 MED ORDER — SPIRIVA HANDIHALER 18 MCG IN CAPS: 18.0000 ug | ORAL_CAPSULE | Freq: Every day | RESPIRATORY_TRACT | 3 refills | Status: DC

## 2018-12-16 NOTE — Patient Instructions (Signed)
Coping with Quitting Smoking  Quitting smoking is a physical and mental challenge. You will face cravings, withdrawal symptoms, and temptation. Before quitting, work with your health care provider to make a plan that can help you cope. Preparation can help you quit and keep you from giving in. How can I cope with cravings? Cravings usually last for 5-10 minutes. If you get through it, the craving will pass. Consider taking the following actions to help you cope with cravings:  Keep your mouth busy: ? Chew sugar-free gum. ? Suck on hard candies or a straw. ? Brush your teeth.  Keep your hands and body busy: ? Immediately change to a different activity when you feel a craving. ? Squeeze or play with a ball. ? Do an activity or a hobby, like making bead jewelry, practicing needlepoint, or working with wood. ? Mix up your normal routine. ? Take a short exercise break. Go for a quick walk or run up and down stairs. ? Spend time in public places where smoking is not allowed.  Focus on doing something kind or helpful for someone else.  Call a friend or family member to talk during a craving.  Join a support group.  Call a quit line, such as 1-800-QUIT-NOW.  Talk with your health care provider about medicines that might help you cope with cravings and make quitting easier for you. How can I deal with withdrawal symptoms? Your body may experience negative effects as it tries to get used to not having nicotine in the system. These effects are called withdrawal symptoms. They may include:  Feeling hungrier than normal.  Trouble concentrating.  Irritability.  Trouble sleeping.  Feeling depressed.  Restlessness and agitation.  Craving a cigarette. To manage withdrawal symptoms:  Avoid places, people, and activities that trigger your cravings.  Remember why you want to quit.  Get plenty of sleep.  Avoid coffee and other caffeinated drinks. These may worsen some of your symptoms.  How can I handle social situations? Social situations can be difficult when you are quitting smoking, especially in the first few weeks. To manage this, you can:  Avoid parties, bars, and other social situations where people might be smoking.  Avoid alcohol.  Leave right away if you have the urge to smoke.  Explain to your family and friends that you are quitting smoking. Ask for understanding and support.  Plan activities with friends or family where smoking is not an option. What are some ways I can cope with stress? Wanting to smoke may cause stress, and stress can make you want to smoke. Find ways to manage your stress. Relaxation techniques can help. For example:  Breathe slowly and deeply, in through your nose and out through your mouth.  Listen to soothing, relaxing music.  Talk with a family member or friend about your stress.  Light a candle.  Soak in a bath or take a shower.  Think about a peaceful place. What are some ways I can prevent weight gain? Be aware that many people gain weight after they quit smoking. However, not everyone does. To keep from gaining weight, have a plan in place before you quit and stick to the plan after you quit. Your plan should include:  Having healthy snacks. When you have a craving, it may help to: ? Eat plain popcorn, crunchy carrots, celery, or other cut vegetables. ? Chew sugar-free gum.  Changing how you eat: ? Eat small portion sizes at meals. ? Eat 4-6 small meals   throughout the day instead of 1-2 large meals a day. ? Be mindful when you eat. Do not watch television or do other things that might distract you as you eat.  Exercising regularly: ? Make time to exercise each day. If you do not have time for a long workout, do short bouts of exercise for 5-10 minutes several times a day. ? Do some form of strengthening exercise, like weight lifting, and some form of aerobic exercise, like running or swimming.  Drinking plenty of  water or other low-calorie or no-calorie drinks. Drink 6-8 glasses of water daily, or as much as instructed by your health care provider. Summary  Quitting smoking is a physical and mental challenge. You will face cravings, withdrawal symptoms, and temptation to smoke again. Preparation can help you as you go through these challenges.  You can cope with cravings by keeping your mouth busy (such as by chewing gum), keeping your body and hands busy, and making calls to family, friends, or a helpline for people who want to quit smoking.  You can cope with withdrawal symptoms by avoiding places where people smoke, avoiding drinks with caffeine, and getting plenty of rest.  Ask your health care provider about the different ways to prevent weight gain, avoid stress, and handle social situations. This information is not intended to replace advice given to you by your health care provider. Make sure you discuss any questions you have with your health care provider. Document Released: 04/19/2016 Document Revised: 04/04/2017 Document Reviewed: 04/19/2016 Elsevier Patient Education  2020 [employer]  

## 2018-12-16 NOTE — Assessment & Plan Note (Addendum)
Still smoking 7-10 per day - Cont Chantix - Patient declines nicotine replacement therapy at this time - Will review smoking history with patient on next appointment to see if he needs low dose CT chest scan for lung cancer screening. If he has over 30 pack year smoking history advise he get the CT chest scan as he is over 69 and still smokes.

## 2018-12-16 NOTE — Progress Notes (Signed)
     Subjective: No chief complaint on file.  HPI: Gavin Kane is a 63 y.o. presenting to clinic today to discuss the following:  HTN Patient states he is now taking Losartan 100mg  daily. He does not check his BP at home and does not have a BP cuff at home saying "I don't have enough money to get one". No headaches or vision changes reported today.  COPD Recently seen and evaluated by Dr. Valentina Lucks with Spirometry and still in category GOLD Group D. Breathing today is stable with no recent exacerbations or hospitalizations since last visit.  Tobacco Cessation Patient still taking Chantix daily and states it "helps some". He is still smoking 7-8 cigarettes per day. He declines nicotine replacement therapy at this time.  No fever, chills, SOB, chest pain, abdominal pain, nausea, vomiting, diarrhea, or constipation Endorses chronic cough  ROS noted in HPI.   Past Medical, Surgical, Social, and Family History Reviewed & Updated per EMR.   Pertinent Historical Findings include:   Social History   Tobacco Use  Smoking Status Current Some Day Smoker  . Packs/day: 0.50  . Years: 48.00  . Pack years: 24.00  . Types: Cigarettes  . Start date: 05/06/1970  Smokeless Tobacco Former User  Tobacco Comment   7-8 per day     Objective: BP 120/80   Pulse 96   SpO2 96%  Vitals and nursing notes reviewed  Physical Exam Gen: Alert and Oriented x 3, NAD Neck: no LAD CV: RRR, no murmurs, normal S1, S2 split Resp: CTAB but decreased lung sounds diffusely, no wheezing, rales, or rhonchi, comfortable work of breathing Ext: no clubbing, cyanosis, or edema Skin: warm, dry, intact, no rashes  Assessment/Plan:  Hypertension At goal today with BP of 120/80 on Losartan 100mg  daily - cont Losartan 100mg  - Patient to get BP cuff for at home measurements and keep a log and bring to his next appointment to ensure adequate control  COPD (chronic obstructive pulmonary disease) (HCC) Stable,  chronic COPD GOLD Group D - Cont triple therapy with LAMA+LABA+ICS (Dulera, Spiriva) - Cont Albuterol as needed - Advised smoking cessation  Tobacco abuse counseling Still smoking 7-10 per day - Cont Chantix - Patient declines nicotine replacement therapy at this time - Once patient is 34 he will be due for a low dose CT chest scan to screen for lung cancer    PATIENT EDUCATION PROVIDED: See AVS    Diagnosis and plan along with any newly prescribed medication(s) were discussed in detail with this patient today. The patient verbalized understanding and agreed with the plan. Patient advised if symptoms worsen return to clinic or ER.    Meds ordered this encounter  Medications  . mometasone-formoterol (DULERA) 200-5 MCG/ACT AERO    Sig: Inhale 2 puffs into the lungs 2 (two) times daily.    Dispense:  13 g    Refill:  3  . tiotropium (SPIRIVA HANDIHALER) 18 MCG inhalation capsule    Sig: Place 1 capsule (18 mcg total) into inhaler and inhale daily.    Dispense:  60 capsule    Refill:  3  . albuterol (VENTOLIN HFA) 108 (90 Base) MCG/ACT inhaler    Sig: INHALE 1 TO 2 PUFFS INTO THE LUNGS EVERY 4 HOURS AS NEEDED FOR WHEEZING OR SHORTNESS OF BREATH OR COUGH    Dispense:  8.5 g    Refill:  [place], DO 12/16/2018, 1:28 PM PGY-3 [place]

## 2018-12-16 NOTE — Assessment & Plan Note (Signed)
Stable, chronic COPD GOLD Group D - Cont triple therapy with LAMA+LABA+ICS (Dulera, Spiriva) - Cont Albuterol as needed - Advised smoking cessation

## 2018-12-16 NOTE — Assessment & Plan Note (Signed)
At goal today with BP of 120/80 on Losartan 100mg  daily - cont Losartan 100mg  - Patient to get BP cuff for at home measurements and keep a log and bring to his next appointment to ensure adequate control

## 2018-12-21 ENCOUNTER — Encounter: Payer: Self-pay | Admitting: Family Medicine

## 2018-12-24 ENCOUNTER — Telehealth: Payer: Self-pay | Admitting: Pharmacist

## 2018-12-24 NOTE — Telephone Encounter (Signed)
-----   Message from Leavy Cella, [facility] sent at 12/09/2018  3:15 PM EDT ----- Regarding: tobacco cessation

## 2018-12-24 NOTE — Telephone Encounter (Signed)
Phone F/U tobacco cessation.   Reports use of Varenicline 1 mg BID with meals AND denies any intolerance.   Reports reduction to ~ 7 cigarettes per day.  Plan for next week is 6 cigarettes per day.  Encouraged continued progress with tapering off cigarettes.  Discussed post meal strategies (getting busy).   Phone follow-up planned in 2 weeks.

## 2019-01-07 ENCOUNTER — Telehealth: Payer: Self-pay | Admitting: Pharmacist

## 2019-01-07 NOTE — Telephone Encounter (Signed)
Contacted RE tobacco intake reduction/cessation.   Patient reported continued intake ~ 5-7 cigarettes per day.  He is a fair candidate for long-term cessation.   He welcomed follow-up to assist with further reduction of intake.  Plan follow-up in 2-3 weeks.

## 2019-01-07 NOTE — Telephone Encounter (Signed)
-----   Message from Leavy Cella, [facility] sent at 12/24/2018 10:26 AM EDT ----- Regarding: Tobacco Cessation - Chantix use

## 2019-01-29 ENCOUNTER — Telehealth: Payer: Self-pay | Admitting: Pharmacist

## 2019-01-29 NOTE — Telephone Encounter (Signed)
Attempted phone calls 9/24 and 9/25 (left message) requesting call be RE follow-up.  Left HIPAA compliant message.  Provided direct line number for call back to reassess tobacco intake reduction or cessation.

## 2019-03-07 ENCOUNTER — Ambulatory Visit
Admission: EM | Admit: 2019-03-07 | Discharge: 2019-03-07 | Disposition: A | Discharge status: Home / Self Care | Payer: Medicaid Other | Attending: Physician Assistant | Admitting: Physician Assistant

## 2019-03-07 ENCOUNTER — Other Ambulatory Visit: Payer: Self-pay

## 2019-03-07 ENCOUNTER — Encounter: Payer: Self-pay | Admitting: Emergency Medicine

## 2019-03-07 DIAGNOSIS — Z20828 Contact with and (suspected) exposure to other viral communicable diseases: Secondary | ICD-10-CM

## 2019-03-07 DIAGNOSIS — J029 Acute pharyngitis, unspecified: Secondary | ICD-10-CM | POA: Diagnosis not present

## 2019-03-07 DIAGNOSIS — R0981 Nasal congestion: Secondary | ICD-10-CM | POA: Diagnosis not present

## 2019-03-07 DIAGNOSIS — I10 Essential (primary) hypertension: Secondary | ICD-10-CM | POA: Diagnosis not present

## 2019-03-07 DIAGNOSIS — F17200 Nicotine dependence, unspecified, uncomplicated: Secondary | ICD-10-CM

## 2019-03-07 NOTE — ED Notes (Signed)
Patient able to ambulate independently  

## 2019-03-07 NOTE — ED Triage Notes (Signed)
Pt presents to [facility] for assessment of 3-4 days of scratchy, itchy throat with mild soreness.  C/o dry cough in the morning when he wakes, improves through out the day.  Patient denies worsening of his normal shortness of breath,

## 2019-03-07 NOTE — ED Provider Notes (Addendum)
[facility]    CSN: [id number] Arrival date & time: 03/07/19  0844      History   Chief Complaint Chief Complaint  Patient presents with  . Sore Throat    HPI Gavin Kane is a 63 y.o. male.   63 year old male comes in for 3 to 4-day history of cough, throat irritation.  States he has nonproductive cough first thing in the morning, and resolves throughout the day.  He also feels scratchy/itchy throat first thing in the morning, that resolves throughout the day.  He denies any rhinorrhea, nasal congestion.  No obvious postnasal drip.  Denies fever, chills, body aches.  Has shortness of breath at baseline due to COPD/current smoker, this has not worsened.  Denies loss of taste or smell.  Denies abdominal pain, nausea, vomiting, diarrhea.  Denies known sick/Covid contact.  Currently on Chantix for smoking cessation, smoking about 8 to 9 cigarettes a day.     Past Medical History:  Diagnosis Date  . COPD (chronic obstructive pulmonary disease) ([place])   . HTN (hypertension)     Patient Active Problem List   Diagnosis Date Noted  . Healthcare maintenance 11/20/2018  . Screening for AAA (abdominal aortic aneurysm) 11/20/2018  . COPD exacerbation ([place]) 06/20/2018  . Cough 02/09/2018  . Encounter for smoking cessation counseling 11/17/2017  . Non compliance w medication regimen 04/19/2016  . Benzodiazepine abuse ([place]) 12/28/2015  . Anxiety state 05/18/2014  . Chronic fatigue 12/01/2012  . Tobacco abuse counseling 11/27/2012  . COPD (chronic obstructive pulmonary disease) ([place]) 10/13/2012  . DOE (dyspnea on exertion) 10/11/2012  . Nonspecific ST-T changes 10/11/2012  . Hypertension 10/11/2012    Past Surgical History:  Procedure Laterality Date  . HEMORRHOID SURGERY         Home Medications    Prior to Admission medications   Medication Sig Start Date End Date Taking? Authorizing Provider  albuterol (ACCUNEB) 0.63 MG/3ML nebulizer solution Take 3 mLs  (0.63 mg total) by nebulization every 6 (six) hours as needed for wheezing. 07/21/18   Lockamy, Christia [place], DO  albuterol (VENTOLIN HFA) 108 (90 Base) MCG/ACT inhaler INHALE 1 TO 2 PUFFS INTO THE LUNGS EVERY 4 HOURS AS NEEDED FOR WHEEZING OR SHORTNESS OF BREATH OR COUGH 12/16/18   Lockamy, Timothy, DO  azelastine (ASTELIN) 0.1 % nasal spray Place 2 sprays into both nostrils 2 (two) times daily. Use in each nostril as directed 11/28/18   Tasia Catchings, Amy V, PA-C  ipratropium (ATROVENT) 0.06 % nasal spray Place 2 sprays into both nostrils 4 (four) times daily. 11/23/18   Tasia Catchings, Amy V, PA-C  losartan (COZAAR) 100 MG tablet Take 1 tablet (100 mg total) by mouth at bedtime. 11/26/18   Zenia Resides, MD  mometasone-formoterol (DULERA) 200-5 MCG/ACT AERO Inhale 2 puffs into the lungs 2 (two) times daily. 12/16/18   Nuala Alpha, DO  mupirocin ointment (BACTROBAN) 2 % Apply 1 application topically 2 (two) times daily. 10/01/18   Tasia Catchings, Amy V, PA-C  tiotropium (SPIRIVA HANDIHALER) 18 MCG inhalation capsule Place 1 capsule (18 mcg total) into inhaler and inhale daily. 12/16/18   Nuala Alpha, DO  varenicline (CHANTIX CONTINUING MONTH PAK) 1 MG tablet Take 1 tablet (1 mg total) by mouth 2 (two) times daily. 11/26/18   Zenia Resides, MD    Family History Family History  Problem Relation Age of Onset  . Cancer Other        Aunt    Social History Social History   Tobacco  Use  . Smoking status: Current Some Day Smoker    Packs/day: 0.50    Years: 48.00    Pack years: 24.00    Types: Cigarettes    Start date: 05/06/1970  . Smokeless tobacco: Former Systems developer  . Tobacco comment: 7-8 per day   Substance Use Topics  . Alcohol use: Not Currently    Alcohol/week: 0.0 standard drinks    Comment: Social  . Drug use: No     Allergies   Patient has no known allergies.   Review of Systems Review of Systems  Reason unable to perform ROS: See HPI as above.     Physical Exam Triage Vital Signs ED Triage Vitals   Enc Vitals Group     BP 03/07/19 0855 (!) 154/98     Pulse Rate 03/07/19 0855 85     Resp 03/07/19 0855 18     Temp 03/07/19 0855 98.7 F (37.1 C)     Temp Source 03/07/19 0855 Temporal     SpO2 03/07/19 0855 95 %     Weight --      Height --      Head Circumference --      Peak Flow --      Pain Score 03/07/19 0856 2     Pain Loc --      Pain Edu? --      Excl. in [place]? --    No data found.  Updated Vital Signs BP (!) 154/98 (BP Location: Left Arm)   Pulse 85   Temp 98.7 F (37.1 C) (Temporal)   Resp 18   SpO2 95%   Physical Exam Constitutional:      General: He is not in acute distress.    Appearance: Normal appearance. He is not ill-appearing, toxic-appearing or diaphoretic.  HENT:     Head: Normocephalic and atraumatic.     Mouth/Throat:     Mouth: Mucous membranes are moist.     Pharynx: Oropharynx is clear. Uvula midline.  Neck:     Musculoskeletal: Normal range of motion and neck supple.  Cardiovascular:     Rate and Rhythm: Normal rate and regular rhythm.     Heart sounds: Normal heart sounds. No murmur. No friction rub. No gallop.   Pulmonary:     Effort: Pulmonary effort is normal. No accessory muscle usage, prolonged expiration, respiratory distress or retractions.     Comments: Lungs clear to auscultation without adventitious lung sounds. Neurological:     General: No focal deficit present.     Mental Status: He is alert and oriented to person, place, and time.      UC Treatments / Results  Labs (all labs ordered are listed, but only abnormal results are displayed) Labs Reviewed  NOVEL CORONAVIRUS, NAA    EKG   Radiology No results found.  Procedures Procedures (including critical care time)  Medications Ordered in UC Medications - No data to display  Initial Impression / Assessment and Plan / UC Course  I have reviewed the triage vital signs and the nursing notes.  Pertinent labs & imaging results that were available during my care  of the patient were reviewed by me and considered in my medical decision making (see chart for details).    No alarming signs on exam.  Patient speaking in full sentences without respiratory distress. ?PND vs viral illness vs GERD.  COVID testing ordered.  Patient to quarantine until testing results return.  Symptomatic treatment discussed.  Push fluids.  Return precautions given.  Patient expresses understanding and agrees to plan.  Final Clinical Impressions(s) / UC Diagnoses   Final diagnoses:  Nasal congestion  Sore throat   ED Prescriptions    None     PDMP not reviewed this encounter.   Ok Edwards, PA-C 03/07/19 Gregory, Amy V, PA-C 03/07/19 1243

## 2019-03-07 NOTE — Discharge Instructions (Addendum)
As discussed, cannot rule out COVID. Currently, no alarming signs. Testing ordered. I would like you to quarantine until testing results. Restart atrovent and azelastine nasal spray. You can add saline spray if you feel nose being dry. If experiencing shortness of breath, trouble breathing, go to the emergency department for further evaluation needed.

## 2019-03-09 LAB — NOVEL CORONAVIRUS, NAA: SARS-CoV-2, NAA: NOT DETECTED

## 2019-03-16 ENCOUNTER — Other Ambulatory Visit: Payer: Self-pay

## 2019-03-16 ENCOUNTER — Ambulatory Visit
Admission: EM | Admit: 2019-03-16 | Discharge: 2019-03-16 | Disposition: A | Discharge status: Home / Self Care | Payer: Medicaid Other | Attending: Physician Assistant | Admitting: Physician Assistant

## 2019-03-16 DIAGNOSIS — Z20828 Contact with and (suspected) exposure to other viral communicable diseases: Secondary | ICD-10-CM

## 2019-03-16 DIAGNOSIS — J209 Acute bronchitis, unspecified: Secondary | ICD-10-CM | POA: Diagnosis not present

## 2019-03-16 DIAGNOSIS — Z20822 Contact with and (suspected) exposure to covid-19: Secondary | ICD-10-CM

## 2019-03-16 DIAGNOSIS — J22 Unspecified acute lower respiratory infection: Secondary | ICD-10-CM

## 2019-03-16 MED ORDER — DOXYCYCLINE HYCLATE 100 MG PO CAPS: 100.0000 mg | ORAL_CAPSULE | Freq: Two times a day (BID) | ORAL | 0 refills | Status: DC

## 2019-03-16 MED ORDER — PREDNISONE 50 MG PO TABS: 50.0000 mg | ORAL_TABLET | Freq: Every day | ORAL | 0 refills | Status: DC

## 2019-03-16 NOTE — ED Triage Notes (Signed)
Pt states his daughter that lives with him tested positive for COVID. Pt c/o SOB, headache and sore throat x 1wk

## 2019-03-16 NOTE — ED Provider Notes (Signed)
[facility]    CSN: [id number] Arrival date & time: 03/16/19  1412      History   Chief Complaint Chief Complaint  Patient presents with  . Shortness of Breath    HPI Gavin Kane is a 63 y.o. male.   63 year old male returns to clinic for repeat Covid testing after being exposed.  He was here 03/07/2019 with URI symptoms.  At that time, Covid testing was negative.  However, lives with daughter, who recently tested positive for Covid.  Symptoms has not resolved since last visit, but without significant worsening.  He continues to have shortness of breath, but states this is his baseline, denies any worsening.  Denies fever, chills, body aches.  HPI 03/07/2019 63 year old male comes in for 3 to 4-day history of cough, throat irritation.  States he has nonproductive cough first thing in the morning, and resolves throughout the day.  He also feels scratchy/itchy throat first thing in the morning, that resolves throughout the day.  He denies any rhinorrhea, nasal congestion.  No obvious postnasal drip.  Denies fever, chills, body aches.  Has shortness of breath at baseline due to COPD/current smoker, this has not worsened.  Denies loss of taste or smell.  Denies abdominal pain, nausea, vomiting, diarrhea.  Denies known sick/Covid contact.  Currently on Chantix for smoking cessation, smoking about 8 to 9 cigarettes a day.     Past Medical History:  Diagnosis Date  . COPD (chronic obstructive pulmonary disease) ([place])   . HTN (hypertension)     Patient Active Problem List   Diagnosis Date Noted  . Healthcare maintenance 11/20/2018  . Screening for AAA (abdominal aortic aneurysm) 11/20/2018  . COPD exacerbation ([place]) 06/20/2018  . Cough 02/09/2018  . Encounter for smoking cessation counseling 11/17/2017  . Non compliance w medication regimen 04/19/2016  . Benzodiazepine abuse ([place]) 12/28/2015  . Anxiety state 05/18/2014  . Chronic fatigue 12/01/2012  . Tobacco abuse  counseling 11/27/2012  . COPD (chronic obstructive pulmonary disease) ([place]) 10/13/2012  . DOE (dyspnea on exertion) 10/11/2012  . Nonspecific ST-T changes 10/11/2012  . Hypertension 10/11/2012    Past Surgical History:  Procedure Laterality Date  . HEMORRHOID SURGERY         Home Medications    Prior to Admission medications   Medication Sig Start Date End Date Taking? Authorizing Provider  albuterol (ACCUNEB) 0.63 MG/3ML nebulizer solution Take 3 mLs (0.63 mg total) by nebulization every 6 (six) hours as needed for wheezing. 07/21/18   Lockamy, Christia [place], DO  albuterol (VENTOLIN HFA) 108 (90 Base) MCG/ACT inhaler INHALE 1 TO 2 PUFFS INTO THE LUNGS EVERY 4 HOURS AS NEEDED FOR WHEEZING OR SHORTNESS OF BREATH OR COUGH 12/16/18   Lockamy, Timothy, DO  azelastine (ASTELIN) 0.1 % nasal spray Place 2 sprays into both nostrils 2 (two) times daily. Use in each nostril as directed 11/28/18   Tasia Catchings, Pearlee Arvizu V, PA-C  doxycycline (VIBRAMYCIN) 100 MG capsule Take 1 capsule (100 mg total) by mouth 2 (two) times daily. 03/16/19   Tasia Catchings, Torren Maffeo V, PA-C  ipratropium (ATROVENT) 0.06 % nasal spray Place 2 sprays into both nostrils 4 (four) times daily. 11/23/18   Tasia Catchings, Keionte Swicegood V, PA-C  losartan (COZAAR) 100 MG tablet Take 1 tablet (100 mg total) by mouth at bedtime. 11/26/18   Zenia Resides, MD  mometasone-formoterol (DULERA) 200-5 MCG/ACT AERO Inhale 2 puffs into the lungs 2 (two) times daily. 12/16/18   Nuala Alpha, DO  mupirocin ointment (BACTROBAN) 2 %  Apply 1 application topically 2 (two) times daily. 10/01/18   Tasia Catchings, Chucky Homes V, PA-C  predniSONE (DELTASONE) 50 MG tablet Take 1 tablet (50 mg total) by mouth daily with breakfast. 03/16/19   Tasia Catchings, Allannah Kempen V, PA-C  tiotropium (SPIRIVA HANDIHALER) 18 MCG inhalation capsule Place 1 capsule (18 mcg total) into inhaler and inhale daily. 12/16/18   Nuala Alpha, DO  varenicline (CHANTIX CONTINUING MONTH PAK) 1 MG tablet Take 1 tablet (1 mg total) by mouth 2 (two) times daily. 11/26/18    Zenia Resides, MD    Family History Family History  Problem Relation Age of Onset  . Cancer Other        Aunt    Social History Social History   Tobacco Use  . Smoking status: Current Some Day Smoker    Packs/day: 0.50    Years: 48.00    Pack years: 24.00    Types: Cigarettes    Start date: 05/06/1970  . Smokeless tobacco: Former Systems developer  . Tobacco comment: 7-8 per day   Substance Use Topics  . Alcohol use: Not Currently    Alcohol/week: 0.0 standard drinks    Comment: Social  . Drug use: No     Allergies   Patient has no known allergies.   Review of Systems Review of Systems  Reason unable to perform ROS: See HPI as above.     Physical Exam Triage Vital Signs ED Triage Vitals  Enc Vitals Group     BP 03/16/19 1424 (!) 164/75     Pulse Rate 03/16/19 1424 84     Resp 03/16/19 1424 18     Temp 03/16/19 1424 98.3 F (36.8 C)     Temp Source 03/16/19 1424 Oral     SpO2 03/16/19 1424 100 %     Weight --      Height --      Head Circumference --      Peak Flow --      Pain Score 03/16/19 1425 0     Pain Loc --      Pain Edu? --      Excl. in [place]? --    No data found.  Updated Vital Signs BP (!) 164/75 (BP Location: Left Arm)   Pulse 84   Temp 98.3 F (36.8 C) (Oral)   Resp 18   SpO2 100%   Physical Exam Constitutional:      General: He is not in acute distress.    Appearance: Normal appearance. He is not ill-appearing, toxic-appearing or diaphoretic.  HENT:     Head: Normocephalic and atraumatic.     Mouth/Throat:     Mouth: Mucous membranes are moist.     Pharynx: Oropharynx is clear. Uvula midline.  Neck:     Musculoskeletal: Normal range of motion and neck supple.  Cardiovascular:     Rate and Rhythm: Normal rate and regular rhythm.     Heart sounds: Normal heart sounds. No murmur. No friction rub. No gallop.   Pulmonary:     Effort: Pulmonary effort is normal. No accessory muscle usage, prolonged expiration, respiratory distress or  retractions.     Comments: Patient speaking in full sentences without difficulty.  Mild inspiratory and expiratory wheezing to the periphery.  No obvious rales or rhonchi's.  Good air movement. Skin:    General: Skin is warm and dry.  Neurological:     General: No focal deficit present.     Mental Status: He is alert and oriented  to person, place, and time.      UC Treatments / Results  Labs (all labs ordered are listed, but only abnormal results are displayed) Labs Reviewed  NOVEL CORONAVIRUS, NAA    EKG   Radiology No results found.  Procedures Procedures (including critical care time)  Medications Ordered in UC Medications - No data to display  Initial Impression / Assessment and Plan / UC Course  I have reviewed the triage vital signs and the nursing notes.  Pertinent labs & imaging results that were available during my care of the patient were reviewed by me and considered in my medical decision making (see chart for details).    Covid testing ordered.  Patient to remain in quarantine until results return.  Patient with stable vitals.  He is able to speak in full sentences without difficulty. However, he has history of significant COPD requiring admission.  Given continued symptoms from visit 03/07/2019 now with wheezing despite albuterol use, will start patient on prednisone and doxycycline to cover for bronchitis.  Strict return precautions given.  Patient expresses understanding and agrees to plan.  Final Clinical Impressions(s) / UC Diagnoses   Final diagnoses:  Exposure to COVID-19 virus  Lower respiratory infection   ED Prescriptions    Medication Sig Dispense Auth. Provider   predniSONE (DELTASONE) 50 MG tablet Take 1 tablet (50 mg total) by mouth daily with breakfast. 5 tablet Kaely Hollan V, PA-C   doxycycline (VIBRAMYCIN) 100 MG capsule Take 1 capsule (100 mg total) by mouth 2 (two) times daily. 20 capsule Ok Edwards, PA-C     PDMP not reviewed this  encounter.   Ok Edwards, PA-C 03/16/19 1531

## 2019-03-16 NOTE — Discharge Instructions (Signed)
As discussed, cannot rule out COVID. Currently, no alarming signs. Testing ordered. I would like you to quarantine until testing results. Start prednisone and doxycycline as directed. Continue inhalers. If experiencing shortness of breath, trouble breathing, go to the emergency department for further evaluation needed.

## 2019-03-17 LAB — NOVEL CORONAVIRUS, NAA: SARS-CoV-2, NAA: NOT DETECTED

## 2019-04-09 ENCOUNTER — Other Ambulatory Visit: Payer: Self-pay | Admitting: *Deleted

## 2019-04-09 MED ORDER — ALBUTEROL SULFATE HFA 108 (90 BASE) MCG/ACT IN AERS: INHALATION_SPRAY | RESPIRATORY_TRACT | 3 refills | Status: DC

## 2019-04-09 MED ORDER — DULERA 200-5 MCG/ACT IN AERO: 2.0000 | INHALATION_SPRAY | Freq: Two times a day (BID) | RESPIRATORY_TRACT | 3 refills | Status: DC

## 2019-04-16 ENCOUNTER — Other Ambulatory Visit: Payer: Self-pay

## 2019-04-16 ENCOUNTER — Ambulatory Visit (INDEPENDENT_AMBULATORY_CARE_PROVIDER_SITE_OTHER): Payer: Medicaid Other | Admitting: Family Medicine

## 2019-04-16 ENCOUNTER — Encounter: Payer: Self-pay | Admitting: Family Medicine

## 2019-04-16 VITALS — BP 128/72 | HR 97 | Wt 143.8 lb

## 2019-04-16 DIAGNOSIS — J432 Centrilobular emphysema: Secondary | ICD-10-CM

## 2019-04-16 MED ORDER — SPIRIVA HANDIHALER 18 MCG IN CAPS: ORAL_CAPSULE | RESPIRATORY_TRACT | 3 refills | Status: DC

## 2019-04-16 MED ORDER — ALBUTEROL SULFATE HFA 108 (90 BASE) MCG/ACT IN AERS: INHALATION_SPRAY | RESPIRATORY_TRACT | 3 refills | Status: DC

## 2019-04-16 MED ORDER — SPIRIVA HANDIHALER 18 MCG IN CAPS: 18.0000 ug | ORAL_CAPSULE | Freq: Two times a day (BID) | RESPIRATORY_TRACT | 3 refills | Status: DC

## 2019-04-16 MED ORDER — ALBUTEROL SULFATE 0.63 MG/3ML IN NEBU: 1.0000 | INHALATION_SOLUTION | Freq: Four times a day (QID) | RESPIRATORY_TRACT | 12 refills | Status: DC | PRN

## 2019-04-16 NOTE — Progress Notes (Signed)
Subjective: Chief Complaint  Patient presents with  . Medication Refill     HPI: Gavin Kane is a 63 y.o. presenting to clinic today to discuss the following:  COPD Patient with PMH of COPD presents today for medication refills and for continued management of his symptoms. He has not been admitted to the hospital for COPD since our last appointment and states his breathing is "okay". He states having to wear his mask is making his symptoms worse however he continues to wear it when appropriate. He is still using albuterol 4-5 times per day due to chest tightness. His symptoms do get better with albuterol and he is taking his daily Dulera and Spiriva. No chest pain, SOB, or current difficulty breathing today in clinic.     ROS noted in HPI.    Social History   Tobacco Use  Smoking Status Current Some Day Smoker  . Packs/day: 0.50  . Years: 48.00  . Pack years: 24.00  . Types: Cigarettes  . Start date: 05/06/1970  Smokeless Tobacco Former User  Tobacco Comment   7-8 per day    Objective: BP 128/72   Pulse 97   Wt 143 lb 12.8 oz (65.2 kg)   SpO2 96%   BMI 23.93 kg/m  Vitals and nursing notes reviewed  Physical Exam Gen: Alert and Oriented x 3, NAD HEENT: Normocephalic, atraumatic CV: RRR, no murmurs, normal S1, S2 split Resp: low lung sounds, diffuse wheezing, no rales or rhonchi, comfortable work of breathing Ext: no clubbing, cyanosis, or edema Skin: warm, dry, intact, no rashes  Assessment/Plan:  COPD (chronic obstructive pulmonary disease) ([place]) Patient with severe COPD not well controlled. - Referral to Pulmonology as they may have more access to helping him get on triple therapy that is cost effective; I tried before but couldn't get triple therapy covered - Increasing Spiriva from one puff per day to two puffs per day at max dose of one capsule 27mcg - Cont Dulera at max dose - Besides adding ICS as a separate inhaler not sure what else can be done,  which again, was not covered under Medicaid. Will consider adding a third daily inhaler if patient is still poorly controlled but compliance with this many inhalers may be an issue. - Refilled albuterol   PATIENT EDUCATION PROVIDED: See AVS    Diagnosis and plan along with any newly prescribed medication(s) were discussed in detail with this patient today. The patient verbalized understanding and agreed with the plan. Patient advised if symptoms worsen return to clinic or ER.     Orders Placed This Encounter  Procedures  . Ambulatory referral to Pulmonology    Referral Priority:   Routine    Referral Type:   Consultation    Referral Reason:   Specialty Services Required    Requested Specialty:   Pulmonary Disease    Number of Visits Requested:   1    Meds ordered this encounter  Medications  . albuterol (VENTOLIN HFA) 108 (90 Base) MCG/ACT inhaler    Sig: INHALE 1 TO 2 PUFFS INTO THE LUNGS EVERY 4 HOURS AS NEEDED FOR WHEEZING OR SHORTNESS OF BREATH OR COUGH    Dispense:  8.5 g    Refill:  3  . albuterol (ACCUNEB) 0.63 MG/3ML nebulizer solution    Sig: Take 3 mLs (0.63 mg total) by nebulization every 6 (six) hours as needed for wheezing.    Dispense:  75 mL    Refill:  12  .  DISCONTD: tiotropium (SPIRIVA HANDIHALER) 18 MCG inhalation capsule    Sig: Place 1 capsule (18 mcg total) into inhaler and inhale 2 (two) times daily.    Dispense:  60 capsule    Refill:  3  . tiotropium (SPIRIVA HANDIHALER) 18 MCG inhalation capsule    Sig: Use on capsule with 2 oral inhalations once per day    Dispense:  60 capsule    Refill:  [place], DO 04/16/2019, 2:30 PM PGY-3 [place]

## 2019-04-16 NOTE — Patient Instructions (Signed)
COPD and Physical Activity °Chronic obstructive pulmonary disease (COPD) is a long-term (chronic) condition that affects the lungs. COPD is a general term that can be used to describe many different lung problems that cause lung swelling (inflammation) and limit airflow, including chronic bronchitis and emphysema. °The main symptom of COPD is shortness of breath, which makes it harder to do even simple tasks. This can also make it harder to exercise and be active. Talk with your health care provider about treatments to help you breathe better and actions you can take to prevent breathing problems during physical activity. °What are the benefits of exercising with COPD? °Exercising regularly is an important part of a healthy lifestyle. You can still exercise and do physical activities even though you have COPD. Exercise and physical activity improve your shortness of breath by increasing blood flow (circulation). This causes your heart to pump more oxygen through your body. Moderate exercise can improve your: °· Oxygen use. °· Energy level. °· Shortness of breath. °· Strength in your breathing muscles. °· Heart health. °· Sleep. °· Self-esteem and feelings of self-worth. °· Depression, stress, and anxiety levels. °Exercise can benefit everyone with COPD. The severity of your disease may affect how hard you can exercise, especially at first, but everyone can benefit. Talk with your health care provider about how much exercise is safe for you, and which activities and exercises are safe for you. °What actions can I take to prevent breathing problems during physical activity? °· Sign up for a pulmonary rehabilitation program. This type of program may include: °? Education about lung diseases. °? Exercise classes that teach you how to exercise and be more active while improving your breathing. This usually involves: °§ Exercise using your lower extremities, such as a stationary bicycle. °§ About 30 minutes of exercise, 2  to 5 times per week, for 6 to 12 weeks °§ Strength training, such as push ups or leg lifts. °? Nutrition education. °? Group classes in which you can talk with others who also have COPD and learn ways to manage stress. °· If you use an oxygen tank, you should use it while you exercise. Work with your health care provider to adjust your oxygen for your physical activity. Your resting flow rate is different from your flow rate during physical activity. °· While you are exercising: °? Take slow breaths. °? Pace yourself and do not try to go too fast. °? Purse your lips while breathing out. Pursing your lips is similar to a kissing or whistling position. °? If doing exercise that uses a quick burst of effort, such as weight lifting: °§ Breathe in before starting the exercise. °§ Breathe out during the hardest part of the exercise (such as raising the weights). °Where to find support °You can find support for exercising with COPD from: °· Your health care provider. °· A pulmonary rehabilitation program. °· Your local health department or community health programs. °· Support groups, online or in-person. Your health care provider may be able to recommend support groups. °Where to find more information °You can find more information about exercising with COPD from: °· American Lung Association: lung.org. °· COPD Foundation: copdfoundation.org. °Contact a health care provider if: °· Your symptoms get worse. °· You have chest pain. °· You have nausea. °· You have a fever. °· You have trouble talking or catching your breath. °· You want to start a new exercise program or a new activity. °Summary °· COPD is a general term that can   be used to describe many different lung problems that cause lung swelling (inflammation) and limit airflow. This includes chronic bronchitis and emphysema. °· Exercise and physical activity improve your shortness of breath by increasing blood flow (circulation). This causes your heart to provide more  oxygen to your body. °· Contact your health care provider before starting any exercise program or new activity. Ask your health care provider what exercises and activities are safe for you. °This information is not intended to replace advice given to you by your health care provider. Make sure you discuss any questions you have with your health care provider. °Document Released: 05/15/2017 Document Revised: 08/12/2018 Document Reviewed: 05/15/2017 °Elsevier Patient Education © 2020 [employer]. ° °

## 2019-04-16 NOTE — Assessment & Plan Note (Addendum)
Patient with severe COPD not well controlled. - Referral to Pulmonology as they may have more access to helping him get on triple therapy that is cost effective; I tried before but couldn't get triple therapy covered - Increasing Spiriva from one puff per day to two puffs per day at max dose of one capsule 20mcg - Cont Dulera at max dose - Besides adding ICS as a separate inhaler not sure what else can be done, which again, was not covered under Medicaid. Will consider adding a third daily inhaler if patient is still poorly controlled but compliance with this many inhalers may be an issue. - Refilled albuterol

## 2019-05-17 ENCOUNTER — Encounter: Payer: Self-pay | Admitting: Emergency Medicine

## 2019-05-17 ENCOUNTER — Ambulatory Visit
Admission: EM | Admit: 2019-05-17 | Discharge: 2019-05-17 | Disposition: A | Discharge status: Home / Self Care | Payer: Medicaid Other | Attending: Emergency Medicine | Admitting: Emergency Medicine

## 2019-05-17 ENCOUNTER — Other Ambulatory Visit: Payer: Self-pay

## 2019-05-17 DIAGNOSIS — Z20822 Contact with and (suspected) exposure to covid-19: Secondary | ICD-10-CM | POA: Diagnosis not present

## 2019-05-17 DIAGNOSIS — I10 Essential (primary) hypertension: Secondary | ICD-10-CM | POA: Diagnosis not present

## 2019-05-17 DIAGNOSIS — J029 Acute pharyngitis, unspecified: Secondary | ICD-10-CM | POA: Diagnosis not present

## 2019-05-17 NOTE — Discharge Instructions (Addendum)
Your COVID test is pending - it is important to quarantine / isolate at home until your results are back. °If you test positive and would like further evaluation for persistent or worsening symptoms, you may schedule an E-visit or virtual (video) visit throughout the [place] MyChart app or website. ° °PLEASE NOTE: If you develop severe chest pain or shortness of breath please go to the ER or call [id number] for further evaluation --> DO NOT schedule electronic or virtual visits for this. °Please call our office for further guidance / recommendations as needed. °

## 2019-05-17 NOTE — ED Triage Notes (Signed)
Pt presents to [facility] for assessment after exposure to someone in his family who tested positive for COVID.  Patient states he's been around them almost every day for the last week.  C/o scratchy throat, denies nasal congestion, denies worsening of cough, denies worsening of SOB.

## 2019-05-17 NOTE — ED Provider Notes (Signed)
[facility]    CSN: [id number] Arrival date & time: 05/17/19  0835      History   Chief Complaint Chief Complaint  Patient presents with  . COVID Exposure    HPI Ajan Cutrer is a 64 y.o. male with history of COPD, hypertension  Presenting for Covid testing: Exposure: granddaughters (3) who tested positive last Monday (results 2 days ago) [place] of exposure[place] Any fever, symptoms since exposure: scratchy throat.  Not taking anything for symptoms  Past Medical History:  Diagnosis Date  . COPD (chronic obstructive pulmonary disease) ([place])   . HTN (hypertension)     Patient Active Problem List   Diagnosis Date Noted  . Healthcare maintenance 11/20/2018  . Screening for AAA (abdominal aortic aneurysm) 11/20/2018  . COPD exacerbation ([place]) 06/20/2018  . Cough 02/09/2018  . Encounter for smoking cessation counseling 11/17/2017  . Non compliance w medication regimen 04/19/2016  . Benzodiazepine abuse ([place]) 12/28/2015  . Anxiety state 05/18/2014  . Chronic fatigue 12/01/2012  . Tobacco abuse counseling 11/27/2012  . COPD (chronic obstructive pulmonary disease) ([place]) 10/13/2012  . DOE (dyspnea on exertion) 10/11/2012  . Nonspecific ST-T changes 10/11/2012  . Hypertension 10/11/2012    Past Surgical History:  Procedure Laterality Date  . HEMORRHOID SURGERY         Home Medications    Prior to Admission medications   Medication Sig Start Date End Date Taking? Authorizing Provider  albuterol (ACCUNEB) 0.63 MG/3ML nebulizer solution Take 3 mLs (0.63 mg total) by nebulization every 6 (six) hours as needed for wheezing. 04/16/19   Lockamy, Christia Reading, DO  albuterol (VENTOLIN HFA) 108 (90 Base) MCG/ACT inhaler INHALE 1 TO 2 PUFFS INTO THE LUNGS EVERY 4 HOURS AS NEEDED FOR WHEEZING OR SHORTNESS OF BREATH OR COUGH 04/16/19   Lockamy, Timothy, DO  azelastine (ASTELIN) 0.1 % nasal spray Place 2 sprays into both nostrils 2 (two) times daily. Use in each  nostril as directed 11/28/18   Tasia Catchings, Amy V, PA-C  ipratropium (ATROVENT) 0.06 % nasal spray Place 2 sprays into both nostrils 4 (four) times daily. 11/23/18   Tasia Catchings, Amy V, PA-C  losartan (COZAAR) 100 MG tablet Take 1 tablet (100 mg total) by mouth at bedtime. 11/26/18   Zenia Resides, MD  mometasone-formoterol (DULERA) 200-5 MCG/ACT AERO Inhale 2 puffs into the lungs 2 (two) times daily. 04/09/19   Nuala Alpha, DO  mupirocin ointment (BACTROBAN) 2 % Apply 1 application topically 2 (two) times daily. 10/01/18   Ok Edwards, PA-C  tiotropium (SPIRIVA HANDIHALER) 18 MCG inhalation capsule Use on capsule with 2 oral inhalations once per day 04/16/19   Nuala Alpha, DO  varenicline (CHANTIX CONTINUING MONTH PAK) 1 MG tablet Take 1 tablet (1 mg total) by mouth 2 (two) times daily. 11/26/18   Zenia Resides, MD    Family History Family History  Problem Relation Age of Onset  . Cancer Other        Aunt    Social History Social History   Tobacco Use  . Smoking status: Current Some Day Smoker    Packs/day: 0.50    Years: 48.00    Pack years: 24.00    Types: Cigarettes    Start date: 05/06/1970  . Smokeless tobacco: Former Systems developer  . Tobacco comment: 7-8 per day   Substance Use Topics  . Alcohol use: Not Currently    Alcohol/week: 0.0 standard drinks    Comment: Social  . Drug use: No  Allergies   Patient has no known allergies.   Review of Systems Review of Systems  Constitutional: Negative for fatigue and fever.  HENT: Positive for sore throat. Negative for congestion, dental problem, ear pain, facial swelling, hearing loss, sinus pain, trouble swallowing and voice change.   Eyes: Negative for photophobia, pain and visual disturbance.  Respiratory: Negative for cough and shortness of breath.   Cardiovascular: Negative for chest pain and palpitations.  Gastrointestinal: Negative for diarrhea and vomiting.  Musculoskeletal: Negative for arthralgias and myalgias.  Neurological:  Negative for dizziness and headaches.     Physical Exam Triage Vital Signs ED Triage Vitals  Enc Vitals Group     BP 05/17/19 0843 (!) 142/88     Pulse Rate 05/17/19 0843 87     Resp 05/17/19 0843 20     Temp 05/17/19 0843 98 F (36.7 C)     Temp Source 05/17/19 0843 Temporal     SpO2 05/17/19 0843 95 %     Weight --      Height --      Head Circumference --      Peak Flow --      Pain Score 05/17/19 0844 0     Pain Loc --      Pain Edu? --      Excl. in [place]? --    No data found.  Updated Vital Signs BP (!) 142/88 (BP Location: Left Arm)   Pulse 87   Temp 98 F (36.7 C) (Temporal)   Resp 20   SpO2 95%   Visual Acuity Right Eye Distance:   Left Eye Distance:   Bilateral Distance:    Right Eye Near:   Left Eye Near:    Bilateral Near:     Physical Exam Constitutional:      General: He is not in acute distress.    Appearance: He is normal weight. He is not toxic-appearing or diaphoretic.  HENT:     Head: Normocephalic and atraumatic.     Mouth/Throat:     Mouth: Mucous membranes are moist.     Pharynx: Oropharynx is clear. No oropharyngeal exudate or posterior oropharyngeal erythema.  Eyes:     General: No scleral icterus.    Pupils: Pupils are equal, round, and reactive to light.  Cardiovascular:     Rate and Rhythm: Normal rate.  Pulmonary:     Effort: Pulmonary effort is normal. No respiratory distress.     Breath sounds: No wheezing.  Musculoskeletal:     Cervical back: Neck supple. No tenderness.  Lymphadenopathy:     Cervical: No cervical adenopathy.  Skin:    Coloration: Skin is not jaundiced or pale.  Neurological:     Mental Status: He is alert and oriented to person, place, and time.      UC Treatments / Results  Labs (all labs ordered are listed, but only abnormal results are displayed) Labs Reviewed  NOVEL CORONAVIRUS, NAA    EKG   Radiology No results found.  Procedures Procedures (including critical care  time)  Medications Ordered in UC Medications - No data to display  Initial Impression / Assessment and Plan / UC Course  I have reviewed the triage vital signs and the nursing notes.  Pertinent labs & imaging results that were available during my care of the patient were reviewed by me and considered in my medical decision making (see chart for details).     Patient afebrile, nontoxic, with SpO2 95%.  Covid  PCR pending.  Patient to quarantine until results are back.  We will continue supportive management.  Return precautions discussed, patient verbalized understanding and is agreeable to plan. Final Clinical Impressions(s) / UC Diagnoses   Final diagnoses:  Exposure to COVID-19 virus  Sore throat     Discharge Instructions     Your COVID test is pending - it is important to quarantine / isolate at home until your results are back. If you test positive and would like further evaluation for persistent or worsening symptoms, you may schedule an E-visit or virtual (video) visit throughout the [facility] app or website.  PLEASE NOTE: If you develop severe chest pain or shortness of breath please go to the ER or call 9-1-1 for further evaluation --> DO NOT schedule electronic or virtual visits for this. Please call our office for further guidance / recommendations as needed.    ED Prescriptions    None     PDMP not reviewed this encounter.   Hall-Potvin, [place], PA-C 05/17/19 0900

## 2019-05-18 LAB — NOVEL CORONAVIRUS, NAA: SARS-CoV-2, NAA: NOT DETECTED

## 2019-05-19 ENCOUNTER — Telehealth (HOSPITAL_COMMUNITY): Payer: Self-pay | Admitting: Emergency Medicine

## 2019-05-19 NOTE — Telephone Encounter (Signed)
error 

## 2019-05-22 ENCOUNTER — Other Ambulatory Visit: Payer: Self-pay

## 2019-05-22 ENCOUNTER — Ambulatory Visit
Admission: EM | Admit: 2019-05-22 | Discharge: 2019-05-22 | Disposition: A | Discharge status: Home / Self Care | Payer: Medicaid Other | Attending: Emergency Medicine | Admitting: Emergency Medicine

## 2019-05-22 ENCOUNTER — Encounter: Payer: Self-pay | Admitting: Emergency Medicine

## 2019-05-22 DIAGNOSIS — Z20822 Contact with and (suspected) exposure to covid-19: Secondary | ICD-10-CM | POA: Diagnosis not present

## 2019-05-22 DIAGNOSIS — F17210 Nicotine dependence, cigarettes, uncomplicated: Secondary | ICD-10-CM

## 2019-05-22 DIAGNOSIS — J069 Acute upper respiratory infection, unspecified: Secondary | ICD-10-CM

## 2019-05-22 DIAGNOSIS — R059 Cough, unspecified: Secondary | ICD-10-CM

## 2019-05-22 DIAGNOSIS — R05 Cough: Secondary | ICD-10-CM | POA: Diagnosis not present

## 2019-05-22 MED ORDER — LIDOCAINE VISCOUS HCL 2 % MT SOLN: 15.0000 mL | OROMUCOSAL | 0 refills | Status: DC | PRN

## 2019-05-22 MED ORDER — PREDNISONE 20 MG PO TABS: 20.0000 mg | ORAL_TABLET | Freq: Every day | ORAL | 0 refills | Status: DC

## 2019-05-22 NOTE — ED Provider Notes (Addendum)
[facility]    CSN: [id number] Arrival date & time: 05/22/19  0803      History   Chief Complaint Chief Complaint  Patient presents with  . Cough    HPI Gavin Kane is a 64 y.o. male with history of hypertension, COPD presenting for repeat covid testing. Patient seen by me 05/17/19 for testing due to exposure; negative.  Since then, he has developed worsening symptoms including chills, myalgias, cough; particularly worse since Monday.  No new sick contacts. Currently drinking more water and taking advil for headaches w/ adequate relief.  No fever, chest pain, SOB, N/V/D.  Patient still smoking 0.5 ppd, using inhaler which helps with dry cough.    Past Medical History:  Diagnosis Date  . COPD (chronic obstructive pulmonary disease) ([place])   . HTN (hypertension)     Patient Active Problem List   Diagnosis Date Noted  . Healthcare maintenance 11/20/2018  . Screening for AAA (abdominal aortic aneurysm) 11/20/2018  . COPD exacerbation ([place]) 06/20/2018  . Cough 02/09/2018  . Encounter for smoking cessation counseling 11/17/2017  . Non compliance w medication regimen 04/19/2016  . Benzodiazepine abuse ([place]) 12/28/2015  . Anxiety state 05/18/2014  . Chronic fatigue 12/01/2012  . Tobacco abuse counseling 11/27/2012  . COPD (chronic obstructive pulmonary disease) ([place]) 10/13/2012  . DOE (dyspnea on exertion) 10/11/2012  . Nonspecific ST-T changes 10/11/2012  . Hypertension 10/11/2012    Past Surgical History:  Procedure Laterality Date  . HEMORRHOID SURGERY         Home Medications    Prior to Admission medications   Medication Sig Start Date End Date Taking? Authorizing Provider  albuterol (ACCUNEB) 0.63 MG/3ML nebulizer solution Take 3 mLs (0.63 mg total) by nebulization every 6 (six) hours as needed for wheezing. 04/16/19   Lockamy, Christia Reading, DO  albuterol (VENTOLIN HFA) 108 (90 Base) MCG/ACT inhaler INHALE 1 TO 2 PUFFS INTO THE LUNGS EVERY 4 HOURS AS  NEEDED FOR WHEEZING OR SHORTNESS OF BREATH OR COUGH 04/16/19   Lockamy, Timothy, DO  azelastine (ASTELIN) 0.1 % nasal spray Place 2 sprays into both nostrils 2 (two) times daily. Use in each nostril as directed 11/28/18   Tasia Catchings, Amy V, PA-C  ipratropium (ATROVENT) 0.06 % nasal spray Place 2 sprays into both nostrils 4 (four) times daily. 11/23/18   Tasia Catchings, Amy V, PA-C  lidocaine (XYLOCAINE) 2 % solution Use as directed 15 mLs in the mouth or throat as needed for mouth pain. 05/22/19   Hall-Potvin, [place], PA-C  losartan (COZAAR) 100 MG tablet Take 1 tablet (100 mg total) by mouth at bedtime. 11/26/18   Zenia Resides, MD  mometasone-formoterol (DULERA) 200-5 MCG/ACT AERO Inhale 2 puffs into the lungs 2 (two) times daily. 04/09/19   Nuala Alpha, DO  mupirocin ointment (BACTROBAN) 2 % Apply 1 application topically 2 (two) times daily. 10/01/18   Tasia Catchings, Amy V, PA-C  predniSONE (DELTASONE) 20 MG tablet Take 1 tablet (20 mg total) by mouth daily. 05/22/19   Hall-Potvin, [place], PA-C  tiotropium (SPIRIVA HANDIHALER) 18 MCG inhalation capsule Use on capsule with 2 oral inhalations once per day 04/16/19   Nuala Alpha, DO  varenicline (CHANTIX CONTINUING MONTH PAK) 1 MG tablet Take 1 tablet (1 mg total) by mouth 2 (two) times daily. 11/26/18   Zenia Resides, MD    Family History Family History  Problem Relation Age of Onset  . Cancer Other        Aunt    Social History  Social History   Tobacco Use  . Smoking status: Current Some Day Smoker    Packs/day: 0.50    Years: 48.00    Pack years: 24.00    Types: Cigarettes    Start date: 05/06/1970  . Smokeless tobacco: Former Systems developer  . Tobacco comment: 7-8 per day   Substance Use Topics  . Alcohol use: Not Currently    Alcohol/week: 0.0 standard drinks    Comment: Social  . Drug use: No     Allergies   Patient has no known allergies.   Review of Systems As per HPI   Physical Exam Triage Vital Signs ED Triage Vitals  Enc Vitals Group       BP      Pulse      Resp      Temp      Temp src      SpO2      Weight      Height      Head Circumference      Peak Flow      Pain Score      Pain Loc      Pain Edu?      Excl. in [place]?    No data found.  Updated Vital Signs BP 130/87 (BP Location: Left Arm)   Pulse 95   Temp 97.8 F (36.6 C) (Temporal)   Resp (!) 22   SpO2 95%   Visual Acuity Right Eye Distance:   Left Eye Distance:   Bilateral Distance:    Right Eye Near:   Left Eye Near:    Bilateral Near:     Physical Exam Constitutional:      General: He is not in acute distress.    Appearance: He is not toxic-appearing or diaphoretic.  HENT:     Head: Normocephalic and atraumatic.     Mouth/Throat:     Mouth: Mucous membranes are moist.     Pharynx: Oropharynx is clear.  Eyes:     General: No scleral icterus.    Conjunctiva/sclera: Conjunctivae normal.     Pupils: Pupils are equal, round, and reactive to light.  Neck:     Comments: Trachea midline, negative JVD Cardiovascular:     Rate and Rhythm: Normal rate and regular rhythm.  Pulmonary:     Effort: Pulmonary effort is normal. No respiratory distress.     Breath sounds: No stridor. No wheezing, rhonchi or rales.     Comments: Decreased BS bilaterally.  Respiratory rate 18 at bedside Musculoskeletal:     Cervical back: Neck supple. No tenderness.  Lymphadenopathy:     Cervical: No cervical adenopathy.  Skin:    Capillary Refill: Capillary refill takes less than 2 seconds.     Coloration: Skin is not jaundiced or pale.     Findings: No rash.  Neurological:     Mental Status: He is alert and oriented to person, place, and time.      UC Treatments / Results  Labs (all labs ordered are listed, but only abnormal results are displayed) Labs Reviewed  NOVEL CORONAVIRUS, NAA    EKG   Radiology No results found.  Procedures Procedures (including critical care time)  Medications Ordered in UC Medications - No data to  display  Initial Impression / Assessment and Plan / UC Course  I have reviewed the triage vital signs and the nursing notes.  Pertinent labs & imaging results that were available during my care of the patient were reviewed  by me and considered in my medical decision making (see chart for details).     Patient afebrile, nontoxic, with SpO2 95%.  Covid PCR pending.  Patient to quarantine until results are back.  We will continue supportive management, add prednisone for suspected bronchitis vs URI vs covid.  Return precautions discussed, patient verbalized understanding and is agreeable to plan. Final Clinical Impressions(s) / UC Diagnoses   Final diagnoses:  Cough  Upper respiratory tract infection, unspecified type     Discharge Instructions     Your COVID test is pending - it is important to quarantine / isolate at home until your results are back. If you test positive and would like further evaluation for persistent or worsening symptoms, you may schedule an E-visit or virtual (video) visit throughout the [facility] app or website.  PLEASE NOTE: If you develop severe chest pain or shortness of breath please go to the ER or call 9-1-1 for further evaluation --> DO NOT schedule electronic or virtual visits for this. Please call our office for further guidance / recommendations as needed.    ED Prescriptions    Medication Sig Dispense Auth. Provider   lidocaine (XYLOCAINE) 2 % solution Use as directed 15 mLs in the mouth or throat as needed for mouth pain. 100 mL Hall-Potvin, [place], PA-C   predniSONE (DELTASONE) 20 MG tablet Take 1 tablet (20 mg total) by mouth daily. 7 tablet Hall-Potvin, [place], PA-C     PDMP not reviewed this encounter.   Hall-Potvin, [place], PA-C 05/22/19 0830    Hall-Potvin, [place], [place] 05/22/19 1008

## 2019-05-22 NOTE — ED Triage Notes (Signed)
Pt presents for continued scratchy throat since Monday with cough and body aches developing since.  Patient states he mainly just wants to be sure he still doesn't have COVID.

## 2019-05-22 NOTE — Discharge Instructions (Addendum)
Your COVID test is pending - it is important to quarantine / isolate at home until your results are back. °If you test positive and would like further evaluation for persistent or worsening symptoms, you may schedule an E-visit or virtual (video) visit throughout the [facility] MyChart app or website. ° °PLEASE NOTE: If you develop severe chest pain or shortness of breath please go to the ER or call 9-1-1 for further evaluation --> DO NOT schedule electronic or virtual visits for this. °Please call our office for further guidance / recommendations as needed. °

## 2019-05-22 NOTE — ED Notes (Signed)
Patient able to ambulate independently  

## 2019-05-23 LAB — NOVEL CORONAVIRUS, NAA: SARS-CoV-2, NAA: DETECTED — AB

## 2019-05-24 ENCOUNTER — Other Ambulatory Visit: Payer: Self-pay | Admitting: Physician Assistant

## 2019-05-24 ENCOUNTER — Telehealth (HOSPITAL_COMMUNITY): Payer: Self-pay | Admitting: Emergency Medicine

## 2019-05-24 DIAGNOSIS — I10 Essential (primary) hypertension: Secondary | ICD-10-CM

## 2019-05-24 DIAGNOSIS — U071 COVID-19: Secondary | ICD-10-CM

## 2019-05-24 DIAGNOSIS — J441 Chronic obstructive pulmonary disease with (acute) exacerbation: Secondary | ICD-10-CM

## 2019-05-24 NOTE — Progress Notes (Signed)
Hello Gavin Kane,   You have been scheduled to receive bamlanivumab (the monoclonal antibody we discussed) on : 05/26/19 at 1230pm.  Please arrive 15 minutes early.   The address for the infusion clinic site is:  [place], [place] (previously this was the [place]   When you drive in the main entrance you will see security -  tell them they are there to get an infusion and they will take you to where you need to go.   If you have questions please call [phone].   Should you develop worsening shortness of breath or symptoms over the holiday weekend please do not wait for this appointment and go to the Emergency room for evaluation and treatment.   The day of your visit you should: Marland Kitchen Get plenty of rest the night before and drink plenty of water . Eat a light meal/snack before coming and take your medications as prescribed  . Wear warm, comfortable clothes with a shirt that can roll-up over the elbow (will need IV start).  . Wear a mask  . Consider bringing some activity to help pass the time  I hope this helps find you feeling better,  Montey Hora, PA - C

## 2019-05-24 NOTE — Telephone Encounter (Signed)

## 2019-05-26 ENCOUNTER — Ambulatory Visit (HOSPITAL_COMMUNITY)
Admission: RE | Admit: 2019-05-26 | Discharge: 2019-05-26 | Disposition: A | Discharge status: Home / Self Care | Payer: Medicaid Other | Source: Ambulatory Visit | Attending: Pulmonary Disease | Admitting: Pulmonary Disease

## 2019-05-26 DIAGNOSIS — J441 Chronic obstructive pulmonary disease with (acute) exacerbation: Secondary | ICD-10-CM | POA: Diagnosis present

## 2019-05-26 DIAGNOSIS — Z23 Encounter for immunization: Secondary | ICD-10-CM | POA: Insufficient documentation

## 2019-05-26 DIAGNOSIS — U071 COVID-19: Secondary | ICD-10-CM

## 2019-05-26 DIAGNOSIS — I10 Essential (primary) hypertension: Secondary | ICD-10-CM | POA: Insufficient documentation

## 2019-05-26 MED ORDER — SODIUM CHLORIDE 0.9 % IV SOLN
700.0000 mg | Freq: Once | INTRAVENOUS | Status: AC
  Administered 2019-05-26: 12:00:00 700 mg via INTRAVENOUS
  Filled 2019-05-26: qty 20

## 2019-05-26 MED ORDER — SODIUM CHLORIDE 0.9 % IV SOLN
INTRAVENOUS | Status: DC | PRN
  Administered 2019-05-26: 250 mL via INTRAVENOUS

## 2019-05-26 MED ORDER — DIPHENHYDRAMINE HCL 50 MG/ML IJ SOLN: 50.0000 mg | Freq: Once | INTRAMUSCULAR | Status: DC | PRN

## 2019-05-26 MED ORDER — FAMOTIDINE IN NACL 20-0.9 MG/50ML-% IV SOLN: 20.0000 mg | Freq: Once | INTRAVENOUS | Status: DC | PRN

## 2019-05-26 MED ORDER — EPINEPHRINE 0.3 MG/0.3ML IJ SOAJ: 0.3000 mg | Freq: Once | INTRAMUSCULAR | Status: DC | PRN

## 2019-05-26 MED ORDER — ALBUTEROL SULFATE HFA 108 (90 BASE) MCG/ACT IN AERS: 2.0000 | INHALATION_SPRAY | Freq: Once | RESPIRATORY_TRACT | Status: DC | PRN

## 2019-05-26 MED ORDER — METHYLPREDNISOLONE SODIUM SUCC 125 MG IJ SOLR: 125.0000 mg | Freq: Once | INTRAMUSCULAR | Status: DC | PRN

## 2019-05-26 NOTE — Discharge Instructions (Signed)

## 2019-05-26 NOTE — Progress Notes (Signed)
  Diagnosis: COVID-19  Physician: Dr. Nuala Alpha  Procedure: Covid Infusion Clinic Med: bamlanivimab infusion - Provided patient with bamlanimivab fact sheet for patients, parents and caregivers prior to infusion.  Complications: No immediate complications noted.  Discharge: Discharged home   Gavin Kane L 05/26/2019

## 2019-05-31 ENCOUNTER — Ambulatory Visit (INDEPENDENT_AMBULATORY_CARE_PROVIDER_SITE_OTHER): Payer: Medicaid Other

## 2019-05-31 ENCOUNTER — Ambulatory Visit
Admission: EM | Admit: 2019-05-31 | Discharge: 2019-05-31 | Disposition: A | Discharge status: Home / Self Care | Payer: Medicaid Other | Attending: Emergency Medicine | Admitting: Emergency Medicine

## 2019-05-31 ENCOUNTER — Other Ambulatory Visit: Payer: Self-pay

## 2019-05-31 ENCOUNTER — Encounter: Payer: Self-pay | Admitting: Emergency Medicine

## 2019-05-31 DIAGNOSIS — R05 Cough: Secondary | ICD-10-CM | POA: Diagnosis not present

## 2019-05-31 DIAGNOSIS — U071 COVID-19: Secondary | ICD-10-CM

## 2019-05-31 DIAGNOSIS — R059 Cough, unspecified: Secondary | ICD-10-CM

## 2019-05-31 MED ORDER — AZITHROMYCIN 250 MG PO TABS: 250.0000 mg | ORAL_TABLET | Freq: Every day | ORAL | 0 refills | Status: DC

## 2019-05-31 MED ORDER — AEROCHAMBER PLUS FLO-VU MEDIUM MISC: 1.0000 | Freq: Once | 0 refills | Status: AC

## 2019-05-31 MED ORDER — PREDNISONE 20 MG PO TABS: 40.0000 mg | ORAL_TABLET | Freq: Every day | ORAL | 0 refills | Status: AC

## 2019-05-31 NOTE — ED Provider Notes (Signed)
[facility]    CSN: [id number] Arrival date & time: 05/31/19  [place]      History   Chief Complaint Chief Complaint  Patient presents with  . Shortness of Breath    HPI Gavin Kane is a 64 y.o. male.  With history of COPD, hypertension presenting for worsening cough and shortness of breath since Covid diagnosis at previous UC visit with me 1/16.  Since last visit, patient states sputum has become more productive: Yellow without blood.  Patient feels breathing has worsened as compared to baseline.  Patient has been using albuterol inhaler every 4 hours as needed with moderate relief.  Underwent infusion therapy for Covid on 1/20: Tolerated this well without adverse reaction.  Patient denies fever, lightheadedness, dizziness, chest pain, nausea, abdominal pain, myalgias in office today.  Denies history of blood clot, lower extremity edema, prolonged stasis.   Past Medical History:  Diagnosis Date  . COPD (chronic obstructive pulmonary disease) ([place])   . HTN (hypertension)     Patient Active Problem List   Diagnosis Date Noted  . Healthcare maintenance 11/20/2018  . Screening for AAA (abdominal aortic aneurysm) 11/20/2018  . COPD exacerbation ([place]) 06/20/2018  . Cough 02/09/2018  . Encounter for smoking cessation counseling 11/17/2017  . Non compliance w medication regimen 04/19/2016  . Benzodiazepine abuse ([place]) 12/28/2015  . Anxiety state 05/18/2014  . Chronic fatigue 12/01/2012  . Tobacco abuse counseling 11/27/2012  . COPD (chronic obstructive pulmonary disease) ([place]) 10/13/2012  . DOE (dyspnea on exertion) 10/11/2012  . Nonspecific ST-T changes 10/11/2012  . Hypertension 10/11/2012    Past Surgical History:  Procedure Laterality Date  . HEMORRHOID SURGERY         Home Medications    Prior to Admission medications   Medication Sig Start Date End Date Taking? Authorizing Provider  albuterol (ACCUNEB) 0.63 MG/3ML nebulizer solution Take 3 mLs (0.63  mg total) by nebulization every 6 (six) hours as needed for wheezing. 04/16/19   Lockamy, Christia Reading, DO  albuterol (VENTOLIN HFA) 108 (90 Base) MCG/ACT inhaler INHALE 1 TO 2 PUFFS INTO THE LUNGS EVERY 4 HOURS AS NEEDED FOR WHEEZING OR SHORTNESS OF BREATH OR COUGH 04/16/19   Lockamy, Timothy, DO  azelastine (ASTELIN) 0.1 % nasal spray Place 2 sprays into both nostrils 2 (two) times daily. Use in each nostril as directed 11/28/18   Tasia Catchings, Amy V, PA-C  azithromycin (ZITHROMAX) 250 MG tablet Take 1 tablet (250 mg total) by mouth daily. Take first 2 tablets together, then 1 every day until finished. 05/31/19   Hall-Potvin, [place], PA-C  ipratropium (ATROVENT) 0.06 % nasal spray Place 2 sprays into both nostrils 4 (four) times daily. 11/23/18   Tasia Catchings, Amy V, PA-C  lidocaine (XYLOCAINE) 2 % solution Use as directed 15 mLs in the mouth or throat as needed for mouth pain. 05/22/19   Hall-Potvin, [place], PA-C  losartan (COZAAR) 100 MG tablet Take 1 tablet (100 mg total) by mouth at bedtime. 11/26/18   Zenia Resides, MD  mometasone-formoterol (DULERA) 200-5 MCG/ACT AERO Inhale 2 puffs into the lungs 2 (two) times daily. 04/09/19   Nuala Alpha, DO  mupirocin ointment (BACTROBAN) 2 % Apply 1 application topically 2 (two) times daily. 10/01/18   Tasia Catchings, Amy V, PA-C  predniSONE (DELTASONE) 20 MG tablet Take 2 tablets (40 mg total) by mouth daily for 5 days. 05/31/19 06/05/19  Hall-Potvin, [place], PA-C  Spacer/Aero-Holding Chambers (AEROCHAMBER PLUS FLO-VU MEDIUM) MISC 1 each by Other route once for 1 dose.  05/31/19 05/31/19  Hall-Potvin, [place], PA-C  tiotropium (SPIRIVA HANDIHALER) 18 MCG inhalation capsule Use on capsule with 2 oral inhalations once per day 04/16/19   Nuala Alpha, DO  varenicline (CHANTIX CONTINUING MONTH PAK) 1 MG tablet Take 1 tablet (1 mg total) by mouth 2 (two) times daily. 11/26/18   Zenia Resides, MD    Family History Family History  Problem Relation Age of Onset  . Cancer Other         Aunt    Social History Social History   Tobacco Use  . Smoking status: Current Some Day Smoker    Packs/day: 0.50    Years: 48.00    Pack years: 24.00    Types: Cigarettes    Start date: 05/06/1970  . Smokeless tobacco: Former Systems developer  . Tobacco comment: 7-8 per day   Substance Use Topics  . Alcohol use: Not Currently    Alcohol/week: 0.0 standard drinks    Comment: Social  . Drug use: No     Allergies   Patient has no known allergies.   Review of Systems As per HPI   Physical Exam Triage Vital Signs ED Triage Vitals  Enc Vitals Group     BP      Pulse      Resp      Temp      Temp src      SpO2      Weight      Height      Head Circumference      Peak Flow      Pain Score      Pain Loc      Pain Edu?      Excl. in [place]?    No data found.  Updated Vital Signs BP (!) 157/93 (BP Location: Left Arm)   Pulse (!) 103 Comment: Taken by APP during assessment  Temp 98.8 F (37.1 C) (Temporal)   Resp 20 Comment: Taken by APP during assessment  SpO2 96%   Visual Acuity Right Eye Distance:   Left Eye Distance:   Bilateral Distance:    Right Eye Near:   Left Eye Near:    Bilateral Near:     Physical Exam Constitutional:      General: He is not in acute distress.    Appearance: He is well-developed. He is obese. He is not ill-appearing.  HENT:     Head: Normocephalic and atraumatic.     Mouth/Throat:     Pharynx: Oropharynx is clear.  Eyes:     General: No scleral icterus.    Pupils: Pupils are equal, round, and reactive to light.  Cardiovascular:     Rate and Rhythm: Regular rhythm. Tachycardia present.     Heart sounds: No murmur. No gallop.   Pulmonary:     Effort: Pulmonary effort is normal. No accessory muscle usage or respiratory distress.     Breath sounds: No stridor. Decreased breath sounds and wheezing present.  Chest:     Chest wall: No mass, deformity, tenderness or crepitus.  Musculoskeletal:        General: Normal range of motion.      Right lower leg: No tenderness.     Left lower leg: No tenderness.  Skin:    General: Skin is warm.     Capillary Refill: Capillary refill takes less than 2 seconds.     Coloration: Skin is not cyanotic, jaundiced or pale.     Nails: There is no clubbing.  Comments: Negative for mottling  Neurological:     General: No focal deficit present.     Mental Status: He is alert and oriented to person, place, and time.      UC Treatments / Results  Labs (all labs ordered are listed, but only abnormal results are displayed) Labs Reviewed - No data to display  EKG   Radiology DG Chest 2 View  Result Date: 05/31/2019 CLINICAL DATA:  COVID positive with worsening cough EXAM: CHEST - 2 VIEW COMPARISON:  06/19/2018 FINDINGS: Mild increased bronchitic changes. No focal opacity or pleural effusion. Normal heart size. No pneumothorax. IMPRESSION: Mild bronchitic changes.  No focal pneumonia Electronically Signed   By: Donavan Foil M.D.   On: 05/31/2019 19:16    Procedures Procedures (including critical care time)  Medications Ordered in UC Medications - No data to display  Initial Impression / Assessment and Plan / UC Course  I have reviewed the triage vital signs and the nursing notes.  Pertinent labs & imaging results that were available during my care of the patient were reviewed by me and considered in my medical decision making (see chart for details).      I have reviewed the triage vital signs and the nursing notes.  All pertinent labs & imaging results that were available during my care of the patient were reviewed by me and considered in my medical decision making (see chart for details).  Patient afebrile, nontoxic in office today.  Patient initially tachypneic and tachycardic on presentation, though this trended towards normal after 3 minutes of being seated.  Chest x-ray done in office, reviewed by me radiology: Negative for consolidation, focal opacity, pleural effusion.   Mildly increased bronchitic changes.  Will resume prednisone short-term, and azithromycin given patient's history of COPD, and anti-inflammatory properties.  Patient to follow-up with PCP for further COPD management.  Return precautions discussed, patient verbalized understanding and is agreeable to plan. Final Clinical Impressions(s) / UC Diagnoses   Final diagnoses:  [id number] virus infection  Cough     Discharge Instructions     Use inhaler as prescribed: Recommend use spacing device with this for better medication ministration. Take steroid as prescribed every morning with breakfast. Take Z-Pak as prescribed: 2 tabs on day 1, 1 tab each day thereafter until completed. Go to ER for worsening difficulty breathing, cough, chest pain.  Important follow-up with PCP regarding COPD management.    ED Prescriptions    Medication Sig Dispense Auth. Provider   predniSONE (DELTASONE) 20 MG tablet Take 2 tablets (40 mg total) by mouth daily for 5 days. 10 tablet Hall-Potvin, [place], PA-C   azithromycin (ZITHROMAX) 250 MG tablet Take 1 tablet (250 mg total) by mouth daily. Take first 2 tablets together, then 1 every day until finished. 6 tablet Hall-Potvin, [place], PA-C   Spacer/Aero-Holding Chambers (AEROCHAMBER PLUS FLO-VU MEDIUM) MISC 1 each by Other route once for 1 dose. 1 each Hall-Potvin, [place], PA-C     PDMP not reviewed this encounter.   Neldon [place], [place] 05/31/19 1926

## 2019-05-31 NOTE — ED Notes (Signed)
APP discharging patient.  Patient states during discharge "my breathing has been like this for the past few years!" when APP encouraged patient to follow up in ER if worsening shortness of breath.

## 2019-05-31 NOTE — ED Triage Notes (Signed)
Patient presents to [facility] after testing positive for COVID 9 days ago.  Patient states he woke up this morning to left sided rib pain with deep inspiration and coughing.  States his coughing has turned from non-productive, to productive with yellow phlegm.  Patient short of breath at triage, having to focus more on his breathing than normal.  O2 on rA 96%, HR 120.

## 2019-05-31 NOTE — Discharge Instructions (Signed)
Use inhaler as prescribed: Recommend use spacing device with this for better medication ministration. Take steroid as prescribed every morning with breakfast. Take Z-Pak as prescribed: 2 tabs on day 1, 1 tab each day thereafter until completed. Go to ER for worsening difficulty breathing, cough, chest pain.  Important follow-up with PCP regarding COPD management.

## 2019-06-03 ENCOUNTER — Emergency Department (HOSPITAL_COMMUNITY): Payer: Medicaid Other

## 2019-06-03 ENCOUNTER — Emergency Department (HOSPITAL_COMMUNITY)
Admission: EM | Admit: 2019-06-03 | Discharge: 2019-06-03 | Disposition: A | Discharge status: Home / Self Care | Payer: Medicaid Other | Attending: Emergency Medicine | Admitting: Emergency Medicine

## 2019-06-03 ENCOUNTER — Encounter (HOSPITAL_COMMUNITY): Payer: Self-pay | Admitting: Emergency Medicine

## 2019-06-03 ENCOUNTER — Ambulatory Visit
Admission: EM | Admit: 2019-06-03 | Discharge: 2019-06-03 | Disposition: A | Discharge status: Home / Self Care | Payer: Medicaid Other

## 2019-06-03 ENCOUNTER — Other Ambulatory Visit: Payer: Self-pay

## 2019-06-03 DIAGNOSIS — R1012 Left upper quadrant pain: Secondary | ICD-10-CM

## 2019-06-03 DIAGNOSIS — F17210 Nicotine dependence, cigarettes, uncomplicated: Secondary | ICD-10-CM | POA: Insufficient documentation

## 2019-06-03 DIAGNOSIS — U071 COVID-19: Secondary | ICD-10-CM

## 2019-06-03 DIAGNOSIS — R1011 Right upper quadrant pain: Secondary | ICD-10-CM | POA: Diagnosis not present

## 2019-06-03 DIAGNOSIS — Z8616 Personal history of COVID-19: Secondary | ICD-10-CM

## 2019-06-03 DIAGNOSIS — J449 Chronic obstructive pulmonary disease, unspecified: Secondary | ICD-10-CM | POA: Insufficient documentation

## 2019-06-03 DIAGNOSIS — R000 Tachycardia, unspecified: Secondary | ICD-10-CM

## 2019-06-03 DIAGNOSIS — Z79899 Other long term (current) drug therapy: Secondary | ICD-10-CM | POA: Diagnosis not present

## 2019-06-03 DIAGNOSIS — R1032 Left lower quadrant pain: Secondary | ICD-10-CM | POA: Diagnosis not present

## 2019-06-03 DIAGNOSIS — I10 Essential (primary) hypertension: Secondary | ICD-10-CM | POA: Insufficient documentation

## 2019-06-03 LAB — CBC WITH DIFFERENTIAL/PLATELET
Abs Immature Granulocytes: 0.12 10*3/uL — ABNORMAL HIGH (ref 0.00–0.07)
Basophils Absolute: 0 10*3/uL (ref 0.0–0.1)
Basophils Relative: 0 %
Eosinophils Absolute: 0 10*3/uL (ref 0.0–0.5)
Eosinophils Relative: 0 %
HCT: 40.6 % (ref 39.0–52.0)
Hemoglobin: 12.7 g/dL — ABNORMAL LOW (ref 13.0–17.0)
Immature Granulocytes: 1 %
Lymphocytes Relative: 7 %
Lymphs Abs: 1 10*3/uL (ref 0.7–4.0)
MCH: 28.8 pg (ref 26.0–34.0)
MCHC: 31.3 g/dL (ref 30.0–36.0)
MCV: 92.1 fL (ref 80.0–100.0)
Monocytes Absolute: 0.2 10*3/uL (ref 0.1–1.0)
Monocytes Relative: 2 %
Neutro Abs: 13.2 10*3/uL — ABNORMAL HIGH (ref 1.7–7.7)
Neutrophils Relative %: 90 %
Platelets: 327 10*3/uL (ref 150–400)
RBC: 4.41 MIL/uL (ref 4.22–5.81)
RDW: 13.4 % (ref 11.5–15.5)
WBC: 14.6 10*3/uL — ABNORMAL HIGH (ref 4.0–10.5)
nRBC: 0 % (ref 0.0–0.2)

## 2019-06-03 LAB — COMPREHENSIVE METABOLIC PANEL
ALT: 16 U/L (ref 0–44)
AST: 17 U/L (ref 15–41)
Albumin: 3.6 g/dL (ref 3.5–5.0)
Alkaline Phosphatase: 54 U/L (ref 38–126)
Anion gap: 10 (ref 5–15)
BUN: 16 mg/dL (ref 8–23)
CO2: 25 mmol/L (ref 22–32)
Calcium: 9.5 mg/dL (ref 8.9–10.3)
Chloride: 109 mmol/L (ref 98–111)
Creatinine, Ser: 0.82 mg/dL (ref 0.61–1.24)
GFR calc Af Amer: >60 mL/min (ref >60)
GFR calc non Af Amer: >60 mL/min (ref >60)
Glucose, Bld: 129 mg/dL — ABNORMAL HIGH (ref 70–99)
Potassium: 4.5 mmol/L (ref 3.5–5.1)
Sodium: 144 mmol/L (ref 135–145)
Total Bilirubin: 0.5 mg/dL (ref 0.3–1.2)
Total Protein: 6.8 g/dL (ref 6.5–8.1)

## 2019-06-03 LAB — URINALYSIS, ROUTINE W REFLEX MICROSCOPIC
Bilirubin Urine: NEGATIVE
Glucose, UA: NEGATIVE mg/dL
Hgb urine dipstick: NEGATIVE
Ketones, ur: NEGATIVE mg/dL
Leukocytes,Ua: NEGATIVE
Nitrite: NEGATIVE
Protein, ur: NEGATIVE mg/dL
Specific Gravity, Urine: 1.023 (ref 1.005–1.030)
pH: 7 (ref 5.0–8.0)

## 2019-06-03 LAB — LIPASE, BLOOD: Lipase: 22 U/L (ref 11–51)

## 2019-06-03 MED ORDER — ONDANSETRON HCL 4 MG/2ML IJ SOLN
4.0000 mg | Freq: Once | INTRAMUSCULAR | Status: AC
  Administered 2019-06-03: 4 mg via INTRAVENOUS
  Filled 2019-06-03: qty 2

## 2019-06-03 MED ORDER — MORPHINE SULFATE (PF) 4 MG/ML IV SOLN
4.0000 mg | Freq: Once | INTRAVENOUS | Status: AC
  Administered 2019-06-03: 4 mg via INTRAVENOUS
  Filled 2019-06-03: qty 1

## 2019-06-03 MED ORDER — IOHEXOL 300 MG/ML  SOLN
100.0000 mL | Freq: Once | INTRAMUSCULAR | Status: AC | PRN
  Administered 2019-06-03: 100 mL via INTRAVENOUS

## 2019-06-03 NOTE — Discharge Instructions (Signed)
Return for fever, inability to eat or drink.  

## 2019-06-03 NOTE — ED Notes (Signed)
PT provided sandwich and apple juice. 200 ml output

## 2019-06-03 NOTE — ED Triage Notes (Signed)
Pt c/o severe center abdominal pain radiates around abdomen to back. States was seen 3days ago for same. States pain on breathing. Took a laxative and had a BM that eased the pain but now worse.

## 2019-06-03 NOTE — ED Provider Notes (Addendum)
[facility]    CSN: [id number] Arrival date & time: 06/03/19  1118      History   Chief Complaint Chief Complaint  Patient presents with  . Abdominal Pain  . COVID + 1/16    HPI Gavin Kane is a 64 y.o. male.   64 year old male with history of HTN, COPD comes in for 3 to 4-day history of abdominal pain.  He was diagnosed with Covid 05/22/2019, and was seen for worsening symptoms 05/31/2019.  At that time, chest x-ray showed bronchitic changes, and who was put on azithromycin and prednisone.  Since then, he has had pain to the abdomen, where he points to bilateral upper abdomen.  States it is band-like radiating to the back, cramping sensation, that is worse with deep breathing.  He denies any nausea or vomiting.  Has still been having normal oral intake.  Denies pain with ambulation. Took laxatives with one BM today without relief. Denies history of abdominal surgery. Has baseline shortness of breath due to COPD, does not think it has worsened. Denies chest pain, leg swelling.      Past Medical History:  Diagnosis Date  . COPD (chronic obstructive pulmonary disease) ([place])   . HTN (hypertension)     Patient Active Problem List   Diagnosis Date Noted  . Healthcare maintenance 11/20/2018  . Screening for AAA (abdominal aortic aneurysm) 11/20/2018  . COPD exacerbation ([place]) 06/20/2018  . Cough 02/09/2018  . Encounter for smoking cessation counseling 11/17/2017  . Non compliance w medication regimen 04/19/2016  . Benzodiazepine abuse ([place]) 12/28/2015  . Anxiety state 05/18/2014  . Chronic fatigue 12/01/2012  . Tobacco abuse counseling 11/27/2012  . COPD (chronic obstructive pulmonary disease) ([place]) 10/13/2012  . DOE (dyspnea on exertion) 10/11/2012  . Nonspecific ST-T changes 10/11/2012  . Hypertension 10/11/2012    Past Surgical History:  Procedure Laterality Date  . HEMORRHOID SURGERY        Home Medications    Prior to Admission medications     Medication Sig Start Date End Date Taking? Authorizing Provider  albuterol (ACCUNEB) 0.63 MG/3ML nebulizer solution Take 3 mLs (0.63 mg total) by nebulization every 6 (six) hours as needed for wheezing. 04/16/19   Lockamy, Christia Reading, DO  albuterol (VENTOLIN HFA) 108 (90 Base) MCG/ACT inhaler INHALE 1 TO 2 PUFFS INTO THE LUNGS EVERY 4 HOURS AS NEEDED FOR WHEEZING OR SHORTNESS OF BREATH OR COUGH 04/16/19   Lockamy, Timothy, DO  azelastine (ASTELIN) 0.1 % nasal spray Place 2 sprays into both nostrils 2 (two) times daily. Use in each nostril as directed 11/28/18   Tasia Catchings, [place] Harral V, PA-C  azithromycin (ZITHROMAX) 250 MG tablet Take 1 tablet (250 mg total) by mouth daily. Take first 2 tablets together, then 1 every day until finished. 05/31/19   Hall-Potvin, [place], PA-C  ipratropium (ATROVENT) 0.06 % nasal spray Place 2 sprays into both nostrils 4 (four) times daily. 11/23/18   Tasia Catchings, Rigo Letts V, PA-C  lidocaine (XYLOCAINE) 2 % solution Use as directed 15 mLs in the mouth or throat as needed for mouth pain. 05/22/19   Hall-Potvin, [place], PA-C  losartan (COZAAR) 100 MG tablet Take 1 tablet (100 mg total) by mouth at bedtime. 11/26/18   Zenia Resides, MD  mometasone-formoterol (DULERA) 200-5 MCG/ACT AERO Inhale 2 puffs into the lungs 2 (two) times daily. 04/09/19   Nuala Alpha, DO  mupirocin ointment (BACTROBAN) 2 % Apply 1 application topically 2 (two) times daily. 10/01/18   Ok Edwards, PA-C  predniSONE (DELTASONE) 20 MG tablet Take 2 tablets (40 mg total) by mouth daily for 5 days. 05/31/19 06/05/19  Hall-Potvin, [place], PA-C  tiotropium (SPIRIVA HANDIHALER) 18 MCG inhalation capsule Use on capsule with 2 oral inhalations once per day 04/16/19   Nuala Alpha, DO  varenicline (CHANTIX CONTINUING MONTH PAK) 1 MG tablet Take 1 tablet (1 mg total) by mouth 2 (two) times daily. 11/26/18   Zenia Resides, MD    Family History Family History  Problem Relation Age of Onset  . Cancer Other        Aunt     Social History Social History   Tobacco Use  . Smoking status: Current Some Day Smoker    Packs/day: 0.50    Years: 48.00    Pack years: 24.00    Types: Cigarettes    Start date: 05/06/1970  . Smokeless tobacco: Former Systems developer  . Tobacco comment: 7-8 per day   Substance Use Topics  . Alcohol use: Not Currently    Alcohol/week: 0.0 standard drinks    Comment: Social  . Drug use: No     Allergies   Patient has no known allergies.   Review of Systems Review of Systems  Reason unable to perform ROS: See HPI as above.     Physical Exam Triage Vital Signs ED Triage Vitals  Enc Vitals Group     BP 06/03/19 1128 (!) 152/108     Pulse Rate 06/03/19 1128 (!) 122     Resp 06/03/19 1128 20     Temp 06/03/19 1128 97.9 F (36.6 C)     Temp Source 06/03/19 1128 Oral     SpO2 06/03/19 1128 97 %     Weight --      Height --      Head Circumference --      Peak Flow --      Pain Score 06/03/19 1129 8     Pain Loc --      Pain Edu? --      Excl. in [place]? --    No data found.  Updated Vital Signs BP (!) 152/108 (BP Location: Left Arm)   Pulse (!) 122   Temp 97.9 F (36.6 C) (Oral)   Resp 20   SpO2 97%   Physical Exam Constitutional:      Appearance: Normal appearance.     Comments: Patient grunting, obvious pain at times. Nontoxic in appearance. Nondiaphoretic.   HENT:     Head: Normocephalic and atraumatic.  Cardiovascular:     Rate and Rhythm: Regular rhythm. Tachycardia present.  Pulmonary:     Comments: Difficult exam as patient grunting. Short shallow breaths with obvious expiratory wheezing. Abdominal:     General: Bowel sounds are normal.     Tenderness: There is no abdominal tenderness. There is no right CVA tenderness, left CVA tenderness, guarding or rebound.     Comments: Abdomen slightly distended, soft  Musculoskeletal:     Cervical back: Normal range of motion and neck supple.  Skin:    General: Skin is warm and dry.  Neurological:     Mental  Status: He is alert and oriented to person, place, and time.      UC Treatments / Results  Labs (all labs ordered are listed, but only abnormal results are displayed) Labs Reviewed - No data to display  EKG   Radiology No results found.  Procedures Procedures (including critical care time)  Medications Ordered in UC Medications - No  data to display  Initial Impression / Assessment and Plan / UC Course  I have reviewed the triage vital signs and the nursing notes.  Pertinent labs & imaging results that were available during my care of the patient were reviewed by me and considered in my medical decision making (see chart for details).    64 year old male with history of HTN, COPD comes in for 3 to 4-day history of abdominal pain.  He was diagnosed with Covid 05/22/2019, and was seen for worsening symptoms 05/31/2019.  At that time, chest x-ray showed bronchitic changes, and who was put on azithromycin and prednisone.  Since then, he has had pain to the abdomen, where he points to bilateral upper abdomen.  States it is band-like radiating to the back, cramping sensation, that is worse with deep breathing.  He denies any nausea or vomiting.  Has still been having normal oral intake.  Denies pain with ambulation. Took laxatives with one BM today without relief. Denies history of abdominal surgery. Has baseline shortness of breath due to COPD, does not think it has worsened.   On exam, patient tachycardic to 122, grunting throughout exam, in obvious pain. Heart: Tachycardic, regular rhythm Lungs: Difficult exam as patient taking short shallow breaths with grunting, obvious expiratory wheezing Abdomen: +BS, slightly distended, no tenderness to palpation, no guarding or rebound  Patient with obvious pain, stating it is abdominal, though not reproducible. He continues to be tachycardic throughout visit. Patient has history of COPD and has baseline shortness of breath, in the past few visit I  have seen him without respiratory complaints, he is often wheezing on exam. However, grunting is new for me, though patient feels that this is normal for him. His O2 sat was stable throughout visit.  However, given obvious pain, tachycardia without reproducible abdominal pain, COVID +, will discharge to ED for further evaluation needed.    Final Clinical Impressions(s) / UC Diagnoses   Final diagnoses:  Bilateral upper abdominal pain  COVID-19   ED Prescriptions    None     PDMP not reviewed this encounter.   Ok Edwards, PA-C 06/03/19 1213    Cathlean Sauer V, PA-C 06/03/19 1213

## 2019-06-03 NOTE — Discharge Instructions (Addendum)
64 year old male with history of HTN, COPD comes in for 3 to 4-day history of abdominal pain.  He was diagnosed with Covid 05/22/2019, and was seen for worsening symptoms 05/31/2019.  At that time, chest x-ray showed bronchitic changes, and who was put on azithromycin and prednisone.  Since then, he has had pain to the abdomen, where he points to bilateral upper abdomen.  States it is band-like radiating to the back, cramping sensation, that is worse with deep breathing.  He denies any nausea or vomiting.  Has still been having normal oral intake.  Denies pain with ambulation. Took laxatives with one BM today without relief. Denies history of abdominal surgery. Has baseline shortness of breath due to COPD, does not think it has worsened.   On exam, patient tachycardic to 122, grunting throughout exam, in obvious pain. Heart: Tachycardic, regular rhythm Lungs: Difficult exam as patient taking short shallow breaths with grunting, obvious expiratory wheezing Abdomen: +BS, slightly distended, no tenderness to palpation, no guarding or rebound  Given obvious pain, tachycardia without reproducible abdominal pain, will discharge to ED for further evaluation needed.

## 2019-06-03 NOTE — ED Triage Notes (Signed)
Patient reports abdominal pain x5 days. Denies N/V/D. Reports Covid+. Grunting and SOB during triage. Patient reports this is baseline. Hx COPD.

## 2019-06-03 NOTE — ED Provider Notes (Signed)
[facility] DEPT Provider Note   CSN: [id number] Arrival date & time: 06/03/19  1431     History Chief Complaint  Patient presents with  . Covid+  . Abdominal Pain    Kedarius Verba is a 64 y.o. male.  HPI   Patient presents to the ED for evaluation ofabdominal pain.  Patient states he started having pain several days ago.  He thought he might be constipated so he tried taking some laxatives.  He does have history of recent Covid diagnosis and has history of COPD but does not feel like his respiratory symptoms are any different than usual.  Patient states the pain earlier was very severe at an 8 out of 10.  He ultimately had a bowel movement and noticed some improvement is now more of a 2 out of 10.  He went to an urgent care earlier today and was noted to have significant pain he was sent to the ED for further evaluation.  He denies any fever.  No vomiting or diarrhea.  Past Medical History:  Diagnosis Date  . COPD (chronic obstructive pulmonary disease) ([place])   . HTN (hypertension)     Patient Active Problem List   Diagnosis Date Noted  . Healthcare maintenance 11/20/2018  . Screening for AAA (abdominal aortic aneurysm) 11/20/2018  . COPD exacerbation ([place]) 06/20/2018  . Cough 02/09/2018  . Encounter for smoking cessation counseling 11/17/2017  . Non compliance w medication regimen 04/19/2016  . Benzodiazepine abuse ([place]) 12/28/2015  . Anxiety state 05/18/2014  . Chronic fatigue 12/01/2012  . Tobacco abuse counseling 11/27/2012  . COPD (chronic obstructive pulmonary disease) ([place]) 10/13/2012  . DOE (dyspnea on exertion) 10/11/2012  . Nonspecific ST-T changes 10/11/2012  . Hypertension 10/11/2012    Past Surgical History:  Procedure Laterality Date  . HEMORRHOID SURGERY         Family History  Problem Relation Age of Onset  . Cancer Other        Aunt    Social History   Tobacco Use  . Smoking status: Current Some Day Smoker   Packs/day: 0.50    Years: 48.00    Pack years: 24.00    Types: Cigarettes    Start date: 05/06/1970  . Smokeless tobacco: Former Systems developer  . Tobacco comment: 7-8 per day   Substance Use Topics  . Alcohol use: Not Currently    Alcohol/week: 0.0 standard drinks    Comment: Social  . Drug use: No    Home Medications Prior to Admission medications   Medication Sig Start Date End Date Taking? Authorizing Provider  albuterol (ACCUNEB) 0.63 MG/3ML nebulizer solution Take 3 mLs (0.63 mg total) by nebulization every 6 (six) hours as needed for wheezing. 04/16/19  Yes Lockamy, Timothy, DO  albuterol (VENTOLIN HFA) 108 (90 Base) MCG/ACT inhaler INHALE 1 TO 2 PUFFS INTO THE LUNGS EVERY 4 HOURS AS NEEDED FOR WHEEZING OR SHORTNESS OF BREATH OR COUGH Patient taking differently: Inhale 2 puffs into the lungs daily. SHORTNESS OF BREATH OR COUGH 04/16/19  Yes Lockamy, Timothy, DO  azithromycin (ZITHROMAX) 250 MG tablet Take 1 tablet (250 mg total) by mouth daily. Take first 2 tablets together, then 1 every day until finished. 05/31/19  Yes Hall-Potvin, [place], PA-C  losartan (COZAAR) 100 MG tablet Take 1 tablet (100 mg total) by mouth at bedtime. 11/26/18  Yes Hensel, Jamal Collin, MD  mometasone-formoterol (DULERA) 200-5 MCG/ACT AERO Inhale 2 puffs into the lungs 2 (two) times daily. 04/09/19  Yes  Nuala Alpha, DO  predniSONE (DELTASONE) 20 MG tablet Take 2 tablets (40 mg total) by mouth daily for 5 days. 05/31/19 06/05/19 Yes Hall-Potvin, [place], PA-C  tiotropium (SPIRIVA HANDIHALER) 18 MCG inhalation capsule Use on capsule with 2 oral inhalations once per day 04/16/19  Yes Lockamy, Timothy, DO  azelastine (ASTELIN) 0.1 % nasal spray Place 2 sprays into both nostrils 2 (two) times daily. Use in each nostril as directed Patient not taking: Reported on 06/03/2019 11/28/18   Cathlean Sauer V, PA-C  ipratropium (ATROVENT) 0.06 % nasal spray Place 2 sprays into both nostrils 4 (four) times daily. Patient not taking:  Reported on 06/03/2019 11/23/18   Ok Edwards, PA-C  lidocaine (XYLOCAINE) 2 % solution Use as directed 15 mLs in the mouth or throat as needed for mouth pain. Patient not taking: Reported on 06/03/2019 05/22/19   Hall-Potvin, [place], PA-C  mupirocin ointment (BACTROBAN) 2 % Apply 1 application topically 2 (two) times daily. Patient not taking: Reported on 06/03/2019 10/01/18   Ok Edwards, PA-C  varenicline (CHANTIX CONTINUING MONTH PAK) 1 MG tablet Take 1 tablet (1 mg total) by mouth 2 (two) times daily. Patient not taking: Reported on 06/03/2019 11/26/18   Zenia Resides, MD    Allergies    Patient has no known allergies.  Review of Systems   Review of Systems  All other systems reviewed and are negative.   Physical Exam Updated Vital Signs BP (!) 170/124   Pulse (!) 104   Temp 98.3 F (36.8 C)   Resp (!) 25   SpO2 98%   Physical Exam Vitals and nursing note reviewed.  Constitutional:      General: He is not in acute distress.    Appearance: He is well-developed.  HENT:     Head: Normocephalic and atraumatic.     Right Ear: External ear normal.     Left Ear: External ear normal.  Eyes:     General: No scleral icterus.       Right eye: No discharge.        Left eye: No discharge.     Conjunctiva/sclera: Conjunctivae normal.  Neck:     Trachea: No tracheal deviation.  Cardiovascular:     Rate and Rhythm: Normal rate and regular rhythm.  Pulmonary:     Effort: No respiratory distress.     Breath sounds: No stridor. No wheezing, rhonchi or rales.  Abdominal:     General: Bowel sounds are normal. There is no distension.     Palpations: Abdomen is soft.     Tenderness: There is no abdominal tenderness. There is no guarding or rebound.  Musculoskeletal:        General: No tenderness.     Cervical back: Neck supple.  Skin:    General: Skin is warm and dry.     Findings: No rash.  Neurological:     Mental Status: He is alert.     Cranial Nerves: No cranial nerve deficit  (no facial droop, extraocular movements intact, no slurred speech).     Sensory: No sensory deficit.     Motor: No abnormal muscle tone or seizure activity.     Coordination: Coordination normal.     ED Results / Procedures / Treatments   Labs (all labs ordered are listed, but only abnormal results are displayed) Labs Reviewed  CBC WITH DIFFERENTIAL/PLATELET - Abnormal; Notable for the following components:      Result Value   WBC 14.6 (*)  Hemoglobin 12.7 (*)    Neutro Abs 13.2 (*)    Abs Immature Granulocytes 0.12 (*)    All other components within normal limits  COMPREHENSIVE METABOLIC PANEL  LIPASE, BLOOD  URINALYSIS, ROUTINE W REFLEX MICROSCOPIC    EKG None  Radiology DG Abd Acute W/Chest  Result Date: 06/03/2019 CLINICAL DATA:  3-4 day history of abdominal pain. EXAM: DG ABDOMEN ACUTE W/ 1V CHEST COMPARISON:  Chest x-ray 05/31/2019 FINDINGS: The upright chest x-ray demonstrates normal cardiomediastinal contours. Mild stable tortuosity of the thoracic aorta. No acute pulmonary findings. Stable emphysematous changes. No pleural effusions. Scattered air throughout the small bowel and colon without significant distension. Findings could be due to a mild ileus or gastroenteritis. Moderate stool in the right colon. No free air. The soft tissue shadows are maintained. No worrisome calcifications. Calcified phleboliths are noted in the pelvis. The bony structures are intact. IMPRESSION: 1. No acute cardiopulmonary findings. 2. Possible mild ileus or gastroenteritis. No findings for obstruction or perforation. Electronically Signed   By: Marijo Sanes M.D.   On: 06/03/2019 16:21    Procedures Procedures (including critical care time)  Medications Ordered in ED Medications  morphine 4 MG/ML injection 4 mg (4 mg Intravenous Given 06/03/19 1526)  ondansetron (ZOFRAN) injection 4 mg (4 mg Intravenous Given 06/03/19 1525)    ED Course  I have reviewed the triage vital signs and the  nursing notes.  Pertinent labs & imaging results that were available during my care of the patient were reviewed by me and considered in my medical decision making (see chart for details).  Clinical Course as of Jun 03 1651  Thu Jun 03, 2019  1647 Patient's laboratory test show an elevated white blood cell count.  We will proceed with CT scan for further evaluation.   [JK]    Clinical Course User Index [JK] Dorie Rank, MD   MDM Rules/Calculators/A&P                      Pt with elevated WBC.  TTP llq. Will ct to evaluate for diverticulitis.  Hx of copd but seems to be at baseline.  Pt able to speak in full sentences.  Care turned over to Dr Tyrone Nine Final Clinical Impression(s) / ED Diagnoses pending   Dorie Rank, MD 06/03/19 1654

## 2019-06-03 NOTE — ED Notes (Signed)
Pt verbalizes understanding of DC instructions. Pt belongings returned and is ambulatory out of ED.  

## 2019-06-03 NOTE — ED Notes (Signed)
Knapp MD at bedside  

## 2019-06-22 ENCOUNTER — Ambulatory Visit
Admission: EM | Admit: 2019-06-22 | Discharge: 2019-06-22 | Disposition: A | Discharge status: Home / Self Care | Payer: Medicaid Other | Attending: Physician Assistant | Admitting: Physician Assistant

## 2019-06-22 DIAGNOSIS — I10 Essential (primary) hypertension: Secondary | ICD-10-CM

## 2019-06-22 DIAGNOSIS — J441 Chronic obstructive pulmonary disease with (acute) exacerbation: Secondary | ICD-10-CM

## 2019-06-22 DIAGNOSIS — F17200 Nicotine dependence, unspecified, uncomplicated: Secondary | ICD-10-CM | POA: Diagnosis not present

## 2019-06-22 DIAGNOSIS — Z8616 Personal history of COVID-19: Secondary | ICD-10-CM | POA: Diagnosis not present

## 2019-06-22 MED ORDER — DOXYCYCLINE HYCLATE 100 MG PO CAPS: 100.0000 mg | ORAL_CAPSULE | Freq: Two times a day (BID) | ORAL | 0 refills | Status: DC

## 2019-06-22 MED ORDER — PREDNISONE 50 MG PO TABS: 50.0000 mg | ORAL_TABLET | Freq: Every day | ORAL | 0 refills | Status: DC

## 2019-06-22 MED ORDER — DEXAMETHASONE SODIUM PHOSPHATE 10 MG/ML IJ SOLN
10.0000 mg | Freq: Once | INTRAMUSCULAR | Status: AC
  Administered 2019-06-22: 10 mg via INTRAMUSCULAR

## 2019-06-22 NOTE — ED Provider Notes (Signed)
[facility]    CSN: [id number] Arrival date & time: 06/22/19  1322      History   Chief Complaint Chief Complaint  Patient presents with  . Cough    HPI Gavin Kane is a 64 y.o. male.   64 year old male with history of COPD, HTN comes in for 4-week history of productive cough.  He recently recovered from Covid.  He denies any fever, chills, body aches.  Denies abdominal pain, nausea, vomiting, diarrhea.  Has shortness of breath at baseline due to COPD, denies any worsening.  Still able to proceed with normal activity.  Has been using inhalers as directed.     Past Medical History:  Diagnosis Date  . COPD (chronic obstructive pulmonary disease) ([place])   . HTN (hypertension)     Patient Active Problem List   Diagnosis Date Noted  . Healthcare maintenance 11/20/2018  . Screening for AAA (abdominal aortic aneurysm) 11/20/2018  . COPD exacerbation ([place]) 06/20/2018  . Cough 02/09/2018  . Encounter for smoking cessation counseling 11/17/2017  . Non compliance w medication regimen 04/19/2016  . Benzodiazepine abuse ([place]) 12/28/2015  . Anxiety state 05/18/2014  . Chronic fatigue 12/01/2012  . Tobacco abuse counseling 11/27/2012  . COPD (chronic obstructive pulmonary disease) ([place]) 10/13/2012  . DOE (dyspnea on exertion) 10/11/2012  . Nonspecific ST-T changes 10/11/2012  . Hypertension 10/11/2012    Past Surgical History:  Procedure Laterality Date  . HEMORRHOID SURGERY         Home Medications    Prior to Admission medications   Medication Sig Start Date End Date Taking? Authorizing Provider  albuterol (ACCUNEB) 0.63 MG/3ML nebulizer solution Take 3 mLs (0.63 mg total) by nebulization every 6 (six) hours as needed for wheezing. 04/16/19   Lockamy, Christia [place], DO  albuterol (VENTOLIN HFA) 108 (90 Base) MCG/ACT inhaler INHALE 1 TO 2 PUFFS INTO THE LUNGS EVERY 4 HOURS AS NEEDED FOR WHEEZING OR SHORTNESS OF BREATH OR COUGH Patient taking differently: Inhale  2 puffs into the lungs daily. SHORTNESS OF BREATH OR COUGH 04/16/19   Nuala Alpha, DO  doxycycline (VIBRAMYCIN) 100 MG capsule Take 1 capsule (100 mg total) by mouth 2 (two) times daily. 06/22/19   Tasia Catchings, Amy V, PA-C  losartan (COZAAR) 100 MG tablet Take 1 tablet (100 mg total) by mouth at bedtime. 11/26/18   Zenia Resides, MD  mometasone-formoterol (DULERA) 200-5 MCG/ACT AERO Inhale 2 puffs into the lungs 2 (two) times daily. 04/09/19   Nuala Alpha, DO  predniSONE (DELTASONE) 50 MG tablet Take 1 tablet (50 mg total) by mouth daily with breakfast. 06/22/19   Tasia Catchings, Amy V, PA-C  tiotropium (SPIRIVA HANDIHALER) 18 MCG inhalation capsule Use on capsule with 2 oral inhalations once per day 04/16/19   Nuala Alpha, DO  ipratropium (ATROVENT) 0.06 % nasal spray Place 2 sprays into both nostrils 4 (four) times daily. Patient not taking: Reported on 06/03/2019 11/23/18 06/22/19  Arturo Morton    Family History Family History  Problem Relation Age of Onset  . Cancer Other        Aunt    Social History Social History   Tobacco Use  . Smoking status: Current Some Day Smoker    Packs/day: 0.50    Years: 48.00    Pack years: 24.00    Types: Cigarettes    Start date: 05/06/1970  . Smokeless tobacco: Former Systems developer  . Tobacco comment: 7-8 per day   Substance Use Topics  . Alcohol use: Not  Currently    Alcohol/week: 0.0 standard drinks    Comment: Social  . Drug use: No     Allergies   Patient has no known allergies.   Review of Systems Review of Systems  Reason unable to perform ROS: See HPI as above.     Physical Exam Triage Vital Signs ED Triage Vitals  Enc Vitals Group     BP 06/22/19 1400 (!) 147/77     Pulse Rate 06/22/19 1400 86     Resp 06/22/19 1400 20     Temp 06/22/19 1400 98.2 F (36.8 C)     Temp Source 06/22/19 1400 Oral     SpO2 06/22/19 1400 96 %     Weight --      Height --      Head Circumference --      Peak Flow --      Pain Score 06/22/19 1401 0      Pain Loc --      Pain Edu? --      Excl. in [place]? --    No data found.  Updated Vital Signs BP (!) 147/77 (BP Location: Left Arm)   Pulse 86   Temp 98.2 F (36.8 C) (Oral)   Resp 20   SpO2 96%   Physical Exam Constitutional:      General: He is not in acute distress.    Appearance: Normal appearance. He is not ill-appearing, toxic-appearing or diaphoretic.  HENT:     Head: Normocephalic and atraumatic.     Mouth/Throat:     Mouth: Mucous membranes are moist.     Pharynx: Oropharynx is clear. Uvula midline.  Cardiovascular:     Rate and Rhythm: Normal rate and regular rhythm.     Heart sounds: Normal heart sounds. No murmur. No friction rub. No gallop.   Pulmonary:     Effort: Pulmonary effort is normal. No accessory muscle usage, prolonged expiration, respiratory distress or retractions.     Comments: Decreased air movement with mild wheezing. No obvious rhonchi/rales.  Musculoskeletal:     Cervical back: Normal range of motion and neck supple.  Neurological:     General: No focal deficit present.     Mental Status: He is alert and oriented to person, place, and time.    UC Treatments / Results  Labs (all labs ordered are listed, but only abnormal results are displayed) Labs Reviewed - No data to display  EKG   Radiology No results found.  Procedures Procedures (including critical care time)  Medications Ordered in UC Medications  dexamethasone (DECADRON) injection 10 mg (10 mg Intramuscular Given 06/22/19 1438)    Initial Impression / Assessment and Plan / UC Course  I have reviewed the triage vital signs and the nursing notes.  Pertinent labs & imaging results that were available during my care of the patient were reviewed by me and considered in my medical decision making (see chart for details).    Recent Covid infection 1 month ago, completed resolution of symptoms prior to current symptom onset.  He has history of COPD with frequent exacerbations.  In  the past year, has had 5 exacerbations requiring prednisone use, and multiple requiring antibiotic use.  At this time, will treat for COPD exacerbation with prednisone, doxycycline.  However, feel that patient would benefit with follow-up evaluation by PCP or pulmonology for better control of COPD, especially post Covid infection.  Return precautions given.  Patient expresses understanding and agrees to plan.  Final  Clinical Impressions(s) / UC Diagnoses   Final diagnoses:  COPD exacerbation [facility])   ED Prescriptions    Medication Sig Dispense Auth. Provider   predniSONE (DELTASONE) 50 MG tablet Take 1 tablet (50 mg total) by mouth daily with breakfast. 5 tablet Yu, Amy V, PA-C   doxycycline (VIBRAMYCIN) 100 MG capsule Take 1 capsule (100 mg total) by mouth 2 (two) times daily. 14 capsule Ok Edwards, PA-C     PDMP not reviewed this encounter.   Ok Edwards, PA-C 06/22/19 1754

## 2019-06-22 NOTE — Discharge Instructions (Signed)
Decadron injection in office. Prednisone, doxycycline as directed. Continue inhalers. Follow up with PCP for further evaluation. Go to ED for worsening symptoms

## 2019-06-22 NOTE — ED Triage Notes (Signed)
Pt c/o productive cough with yellow sputum and chest congestion x4wks and covid positive 01/16

## 2019-07-29 ENCOUNTER — Other Ambulatory Visit: Payer: Self-pay | Admitting: *Deleted

## 2019-07-29 MED ORDER — ALBUTEROL SULFATE HFA 108 (90 BASE) MCG/ACT IN AERS: 2.0000 | INHALATION_SPRAY | Freq: Every day | RESPIRATORY_TRACT | 1 refills | Status: DC

## 2019-08-11 ENCOUNTER — Other Ambulatory Visit: Payer: Self-pay | Admitting: Family Medicine

## 2019-08-11 ENCOUNTER — Telehealth: Payer: Self-pay

## 2019-08-11 MED ORDER — ALBUTEROL SULFATE HFA 108 (90 BASE) MCG/ACT IN AERS: 2.0000 | INHALATION_SPRAY | RESPIRATORY_TRACT | 3 refills | Status: DC | PRN

## 2019-08-11 NOTE — Telephone Encounter (Signed)
Patient comes into front office stating that his inhaler was sent in incorrectly. Patient needs albuterol inhaler resent to pharmacy with PRN instructions to inhale 2 puffs into the lungs every 4 hours as needed for wheezing or shortness of breath.   Dr. Owens Shark sent in new prescription to pharmacy.   Patient informed.   Talbot Grumbling, RN

## 2019-09-13 ENCOUNTER — Other Ambulatory Visit: Payer: Self-pay | Admitting: *Deleted

## 2019-09-13 MED ORDER — DULERA 200-5 MCG/ACT IN AERO: 2.0000 | INHALATION_SPRAY | Freq: Two times a day (BID) | RESPIRATORY_TRACT | 3 refills | Status: DC

## 2019-09-14 ENCOUNTER — Other Ambulatory Visit: Payer: Self-pay

## 2019-09-14 ENCOUNTER — Telehealth: Payer: Self-pay

## 2019-09-14 NOTE — Telephone Encounter (Signed)
Received fax from pharmacy, PA needed on [facility]. Clinical questions submitted via Cover My Meds. Waiting on response, could take up to 72 hours.  Cover My Meds info: Key: [id number]

## 2019-09-14 NOTE — Telephone Encounter (Signed)
Patient comes into office regarding issues in receiving dulera inhaler. Patient reports that pharmacy states that he needs a PA to receive medication. [name] is on preferred medication list for medicaid. Called pharmacy, patient has Medicare as primary, and Medicare requires PA.   Patient is completely out of medication. Spoke with Dr. Valentina Lucks regarding samples. We do not receive samples of this medication.   Pharmacy is faxing over PA information for processing.   Talbot Grumbling, RN

## 2019-09-15 NOTE — Telephone Encounter (Signed)
[place] informing pharmacy. Christen Bame, CMA

## 2019-09-22 ENCOUNTER — Ambulatory Visit
Admission: EM | Admit: 2019-09-22 | Discharge: 2019-09-22 | Disposition: A | Discharge status: Home / Self Care | Payer: Medicare Other | Attending: Physician Assistant | Admitting: Physician Assistant

## 2019-09-22 ENCOUNTER — Encounter: Payer: Self-pay | Admitting: Emergency Medicine

## 2019-09-22 ENCOUNTER — Other Ambulatory Visit: Payer: Self-pay

## 2019-09-22 DIAGNOSIS — J441 Chronic obstructive pulmonary disease with (acute) exacerbation: Secondary | ICD-10-CM

## 2019-09-22 DIAGNOSIS — R059 Cough, unspecified: Secondary | ICD-10-CM

## 2019-09-22 MED ORDER — DEXAMETHASONE SODIUM PHOSPHATE 10 MG/ML IJ SOLN
10.0000 mg | Freq: Once | INTRAMUSCULAR | Status: AC
  Administered 2019-09-22: 10 mg via INTRAMUSCULAR

## 2019-09-22 MED ORDER — AEROCHAMBER PLUS FLO-VU MEDIUM MISC
1.0000 | Freq: Once | Status: AC
  Administered 2019-09-22: 1

## 2019-09-22 MED ORDER — PREDNISONE 50 MG PO TABS: 50.0000 mg | ORAL_TABLET | Freq: Every day | ORAL | 0 refills | Status: DC

## 2019-09-22 NOTE — ED Triage Notes (Signed)
Obtained send out covid swab.  Labeled and verified name and dob

## 2019-09-22 NOTE — Discharge Instructions (Signed)
COVID PCR testing ordered. I would like you to quarantine until testing results. Decadron injection in office today. Prednisone as directed. Albuterol inhaler or nebulizer every 4-6 hours as needed. When using inhalers, please use the chamber to make sure you are getting all the dosage. If worsening symptoms, trouble breathing, go to the ED for further evaluation.

## 2019-09-22 NOTE — ED Provider Notes (Signed)
[facility]    CSN: [id number] Arrival date & time: 09/22/19  1032      History   Chief Complaint Chief Complaint  Patient presents with  . Cough    HPI Gavin Kane is a 64 y.o. male.   64 year old male with history of COPD, HTN comes in for 2 day of URI symptoms. Cough, throat irritation, post nasal drip. Shortness of breath at baseline, slightly worse than normal, relieved by albuterol use. Denies fever, chills, body aches. Denies abdominal pain, nausea, vomiting, diarrhea. Finished COVID vaccine.      Past Medical History:  Diagnosis Date  . COPD (chronic obstructive pulmonary disease) ([place])   . HTN (hypertension)     Patient Active Problem List   Diagnosis Date Noted  . Healthcare maintenance 11/20/2018  . Screening for AAA (abdominal aortic aneurysm) 11/20/2018  . COPD exacerbation ([place]) 06/20/2018  . Cough 02/09/2018  . Encounter for smoking cessation counseling 11/17/2017  . Non compliance w medication regimen 04/19/2016  . Benzodiazepine abuse ([place]) 12/28/2015  . Anxiety state 05/18/2014  . Chronic fatigue 12/01/2012  . Tobacco abuse counseling 11/27/2012  . COPD (chronic obstructive pulmonary disease) ([place]) 10/13/2012  . DOE (dyspnea on exertion) 10/11/2012  . Nonspecific ST-T changes 10/11/2012  . Hypertension 10/11/2012    Past Surgical History:  Procedure Laterality Date  . HEMORRHOID SURGERY         Home Medications    Prior to Admission medications   Medication Sig Start Date End Date Taking? Authorizing Provider  albuterol (ACCUNEB) 0.63 MG/3ML nebulizer solution Take 3 mLs (0.63 mg total) by nebulization every 6 (six) hours as needed for wheezing. 04/16/19  Yes Lockamy, Timothy, DO  albuterol (VENTOLIN HFA) 108 (90 Base) MCG/ACT inhaler Inhale 2 puffs into the lungs every 4 (four) hours as needed for wheezing or shortness of breath. SHORTNESS OF BREATH OR COUGH 08/11/19  Yes Martyn Malay, MD  losartan (COZAAR) 100 MG tablet  Take 1 tablet (100 mg total) by mouth at bedtime. 11/26/18  Yes Hensel, Jamal Collin, MD  mometasone-formoterol (DULERA) 200-5 MCG/ACT AERO Inhale 2 puffs into the lungs 2 (two) times daily. 09/13/19  Yes Nuala Alpha, DO  tiotropium (SPIRIVA HANDIHALER) 18 MCG inhalation capsule Use on capsule with 2 oral inhalations once per day 04/16/19  Yes Lockamy, Timothy, DO  predniSONE (DELTASONE) 50 MG tablet Take 1 tablet (50 mg total) by mouth daily with breakfast. 09/22/19   Tasia Catchings, Edgar Reisz V, PA-C  ipratropium (ATROVENT) 0.06 % nasal spray Place 2 sprays into both nostrils 4 (four) times daily. Patient not taking: Reported on 06/03/2019 11/23/18 06/22/19  Arturo Morton    Family History Family History  Problem Relation Age of Onset  . Cancer Other        Aunt    Social History Social History   Tobacco Use  . Smoking status: Current Some Day Smoker    Packs/day: 0.50    Years: 48.00    Pack years: 24.00    Types: Cigarettes    Start date: 05/06/1970  . Smokeless tobacco: Former Systems developer  . Tobacco comment: 7-8 per day   Substance Use Topics  . Alcohol use: Not Currently    Alcohol/week: 0.0 standard drinks    Comment: Social  . Drug use: No     Allergies   Patient has no known allergies.   Review of Systems Review of Systems  Reason unable to perform ROS: See HPI as above.  Physical Exam Triage Vital Signs ED Triage Vitals  Enc Vitals Group     BP 09/22/19 1117 (!) 146/84     Pulse Rate 09/22/19 1117 95     Resp 09/22/19 1117 (!) 26     Temp 09/22/19 1117 98.9 F (37.2 C)     Temp Source 09/22/19 1117 Oral     SpO2 09/22/19 1117 96 %     Weight --      Height --      Head Circumference --      Peak Flow --      Pain Score 09/22/19 1113 0     Pain Loc --      Pain Edu? --      Excl. in [place]? --    No data found.  Updated Vital Signs BP (!) 146/84 (BP Location: Left Arm)   Pulse 95   Temp 98.9 F (37.2 C) (Oral)   Resp (!) 26 Comment: intermittently coughing  SpO2  96%   Physical Exam Constitutional:      General: He is not in acute distress.    Appearance: Normal appearance. He is not ill-appearing, toxic-appearing or diaphoretic.  HENT:     Head: Normocephalic and atraumatic.     Mouth/Throat:     Mouth: Mucous membranes are moist.     Pharynx: Oropharynx is clear. Uvula midline.  Cardiovascular:     Rate and Rhythm: Normal rate and regular rhythm.     Heart sounds: Normal heart sounds. No murmur. No friction rub. No gallop.   Pulmonary:     Effort: Pulmonary effort is normal. No accessory muscle usage, prolonged expiration, respiratory distress or retractions.     Comments: Decreased air movement to bilateral middle and lower fields.  Albuterol 2 puffs x 1: Improved air movement, inspiratory and expiratory wheezing throughout.  Albuterol 2 puffs x 2: Continued improved air movement, improved inspiratory and expiratory wheezing.  Musculoskeletal:     Cervical back: Normal range of motion and neck supple.  Neurological:     General: No focal deficit present.     Mental Status: He is alert and oriented to person, place, and time.      UC Treatments / Results  Labs (all labs ordered are listed, but only abnormal results are displayed) Labs Reviewed  NOVEL CORONAVIRUS, NAA    EKG   Radiology No results found.  Procedures Procedures (including critical care time)  Medications Ordered in UC Medications  dexamethasone (DECADRON) injection 10 mg (10 mg Intramuscular Given 09/22/19 1159)  AeroChamber Plus Flo-Vu Medium MISC 1 each (1 each Other Given 09/22/19 1152)    Initial Impression / Assessment and Plan / UC Course  I have reviewed the triage vital signs and the nursing notes.  Pertinent labs & imaging results that were available during my care of the patient were reviewed by me and considered in my medical decision making (see chart for details).    While watching patient use albuterol, concerns for inadequate use and not  receiving full dosage. Provided spacer to help with inhaler use.   Patient requesting repeat Covid testing.  Covid testing ordered, to quarantine until testing results return.  Decadron injection in office today.  Prednisone as directed.  Return precautions given.  Patient expresses understanding and agrees to plan.  Final Clinical Impressions(s) / UC Diagnoses   Final diagnoses:  COPD exacerbation ([place])  Cough   ED Prescriptions    Medication Sig Dispense Auth. Provider   predniSONE (  DELTASONE) 50 MG tablet Take 1 tablet (50 mg total) by mouth daily with breakfast. 5 tablet Ok Edwards, PA-C     PDMP not reviewed this encounter.   Ok Edwards, PA-C 09/22/19 2004

## 2019-09-22 NOTE — ED Triage Notes (Signed)
Received second vaccine over a month ago.  Patient has cough and sob that worsened 2 days ago.  Patient says he has copd.  Patient reports this is worse than usual, itchy throat, more frequent cough

## 2019-09-23 LAB — SARS-COV-2, NAA 2 DAY TAT

## 2019-09-23 LAB — NOVEL CORONAVIRUS, NAA: SARS-CoV-2, NAA: NOT DETECTED

## 2019-09-26 ENCOUNTER — Ambulatory Visit (INDEPENDENT_AMBULATORY_CARE_PROVIDER_SITE_OTHER): Payer: Medicare Other

## 2019-09-26 ENCOUNTER — Encounter: Payer: Self-pay | Admitting: Emergency Medicine

## 2019-09-26 ENCOUNTER — Ambulatory Visit
Admission: EM | Admit: 2019-09-26 | Discharge: 2019-09-26 | Disposition: A | Discharge status: Home / Self Care | Payer: Medicare Other | Attending: Emergency Medicine | Admitting: Emergency Medicine

## 2019-09-26 ENCOUNTER — Other Ambulatory Visit: Payer: Self-pay

## 2019-09-26 DIAGNOSIS — Z716 Tobacco abuse counseling: Secondary | ICD-10-CM

## 2019-09-26 DIAGNOSIS — J209 Acute bronchitis, unspecified: Secondary | ICD-10-CM

## 2019-09-26 DIAGNOSIS — J441 Chronic obstructive pulmonary disease with (acute) exacerbation: Secondary | ICD-10-CM

## 2019-09-26 DIAGNOSIS — R05 Cough: Secondary | ICD-10-CM

## 2019-09-26 DIAGNOSIS — R0602 Shortness of breath: Secondary | ICD-10-CM

## 2019-09-26 LAB — POC SARS CORONAVIRUS 2 AG -  ED: SARS Coronavirus 2 Ag: NEGATIVE

## 2019-09-26 MED ORDER — DOXYCYCLINE HYCLATE 100 MG PO CAPS: 100.0000 mg | ORAL_CAPSULE | Freq: Two times a day (BID) | ORAL | 0 refills | Status: DC

## 2019-09-26 MED ORDER — BENZONATATE 100 MG PO CAPS: 100.0000 mg | ORAL_CAPSULE | Freq: Three times a day (TID) | ORAL | 0 refills | Status: DC

## 2019-09-26 MED ORDER — CETIRIZINE HCL 10 MG PO TABS
10.0000 mg | ORAL_TABLET | Freq: Every day | ORAL | 0 refills | Status: DC
Start: 2019-09-26 — End: 2019-11-24

## 2019-09-26 NOTE — ED Triage Notes (Signed)
Patient seen 09/22/19.  Patient reports he is coughing up thick, yellow phlegm.  Patient says breathing is worse.  Patient has a cough ..patient denies fever

## 2019-09-26 NOTE — ED Provider Notes (Signed)
[facility]    CSN: [id number] Arrival date & time: 09/26/19  1053      History   Chief Complaint Chief Complaint  Patient presents with  . URI    HPI Gavin Kane is a 64 y.o. male with history of hypertension, COPD, tobacco use presenting for persistent cough and difficulty breathing.  Patient previously evaluated for symptoms on 09/22/2019: Given prednisone x5 days, Decadron injection and inhaler in office.  Covid test since resulted negative.  Patient has albuterol inhaler at home: Using this with some relief.  States he finished prednisone.  No fever, arthralgia, myalgias.  Does endorse malaise.  No chest pain, lower extreme edema, nausea, vomiting, diarrhea.  Past Medical History:  Diagnosis Date  . COPD (chronic obstructive pulmonary disease) ([place])   . HTN (hypertension)     Patient Active Problem List   Diagnosis Date Noted  . Healthcare maintenance 11/20/2018  . Screening for AAA (abdominal aortic aneurysm) 11/20/2018  . COPD exacerbation ([place]) 06/20/2018  . Cough 02/09/2018  . Encounter for smoking cessation counseling 11/17/2017  . Non compliance w medication regimen 04/19/2016  . Benzodiazepine abuse ([place]) 12/28/2015  . Anxiety state 05/18/2014  . Chronic fatigue 12/01/2012  . Tobacco abuse counseling 11/27/2012  . COPD (chronic obstructive pulmonary disease) ([place]) 10/13/2012  . DOE (dyspnea on exertion) 10/11/2012  . Nonspecific ST-T changes 10/11/2012  . Hypertension 10/11/2012    Past Surgical History:  Procedure Laterality Date  . HEMORRHOID SURGERY         Home Medications    Prior to Admission medications   Medication Sig Start Date End Date Taking? Authorizing Provider  albuterol (ACCUNEB) 0.63 MG/3ML nebulizer solution Take 3 mLs (0.63 mg total) by nebulization every 6 (six) hours as needed for wheezing. 04/16/19   Lockamy, Christia Reading, DO  albuterol (VENTOLIN HFA) 108 (90 Base) MCG/ACT inhaler Inhale 2 puffs into the lungs every 4  (four) hours as needed for wheezing or shortness of breath. SHORTNESS OF BREATH OR COUGH 08/11/19   Martyn Malay, MD  benzonatate (TESSALON) 100 MG capsule Take 1 capsule (100 mg total) by mouth every 8 (eight) hours. 09/26/19   Hall-Potvin, [place], PA-C  cetirizine (ZYRTEC ALLERGY) 10 MG tablet Take 1 tablet (10 mg total) by mouth daily. 09/26/19   Hall-Potvin, [place], PA-C  doxycycline (VIBRAMYCIN) 100 MG capsule Take 1 capsule (100 mg total) by mouth 2 (two) times daily. 09/26/19   Hall-Potvin, [place], PA-C  losartan (COZAAR) 100 MG tablet Take 1 tablet (100 mg total) by mouth at bedtime. 11/26/18   Zenia Resides, MD  mometasone-formoterol (DULERA) 200-5 MCG/ACT AERO Inhale 2 puffs into the lungs 2 (two) times daily. 09/13/19   Nuala Alpha, DO  tiotropium (SPIRIVA HANDIHALER) 18 MCG inhalation capsule Use on capsule with 2 oral inhalations once per day 04/16/19   Nuala Alpha, DO  ipratropium (ATROVENT) 0.06 % nasal spray Place 2 sprays into both nostrils 4 (four) times daily. Patient not taking: Reported on 06/03/2019 11/23/18 06/22/19  Arturo Morton    Family History Family History  Problem Relation Age of Onset  . Cancer Other        Aunt    Social History Social History   Tobacco Use  . Smoking status: Current Some Day Smoker    Packs/day: 0.50    Years: 48.00    Pack years: 24.00    Types: Cigarettes    Start date: 05/06/1970  . Smokeless tobacco: Former Systems developer  . Tobacco  comment: 7-8 per day   Substance Use Topics  . Alcohol use: Not Currently    Alcohol/week: 0.0 standard drinks    Comment: Social  . Drug use: No     Allergies   Patient has no known allergies.   Review of Systems As per HPI   Physical Exam Triage Vital Signs ED Triage Vitals  Enc Vitals Group     BP      Pulse      Resp      Temp      Temp src      SpO2      Weight      Height      Head Circumference      Peak Flow      Pain Score      Pain Loc      Pain Edu?       Excl. in [place]?    No data found.  Updated Vital Signs BP 139/71 (BP Location: Left Arm)   Pulse 90   Temp 98 F (36.7 C) (Oral)   Resp (!) 24   SpO2 94%   Visual Acuity Right Eye Distance:   Left Eye Distance:   Bilateral Distance:    Right Eye Near:   Left Eye Near:    Bilateral Near:     Physical Exam Constitutional:      General: He is not in acute distress.    Appearance: He is not toxic-appearing or diaphoretic.  HENT:     Head: Normocephalic and atraumatic.     Mouth/Throat:     Mouth: Mucous membranes are moist.     Pharynx: Oropharynx is clear.  Eyes:     General: No scleral icterus.    Conjunctiva/sclera: Conjunctivae normal.     Pupils: Pupils are equal, round, and reactive to light.  Neck:     Comments: Trachea midline, negative JVD Cardiovascular:     Rate and Rhythm: Normal rate and regular rhythm.  Pulmonary:     Effort: Pulmonary effort is normal. No respiratory distress.     Breath sounds: Wheezing and rhonchi present.  Musculoskeletal:     Cervical back: Neck supple. No tenderness.  Lymphadenopathy:     Cervical: No cervical adenopathy.  Skin:    Capillary Refill: Capillary refill takes less than 2 seconds.     Coloration: Skin is not jaundiced or pale.     Findings: No rash.  Neurological:     Mental Status: He is alert and oriented to person, place, and time.      UC Treatments / Results  Labs (all labs ordered are listed, but only abnormal results are displayed) Labs Reviewed  POC SARS CORONAVIRUS 2 AG -  ED - Normal    EKG   Radiology DG Chest 2 View  Result Date: 09/26/2019 CLINICAL DATA:  Cough.  Shortness of breath. EXAM: CHEST - 2 VIEW COMPARISON:  May 31, 2019 FINDINGS: Hyperinflation of the lungs with flattening of the diaphragm on the lateral view. The heart, hila, mediastinum, lungs, and pleura are otherwise unremarkable. IMPRESSION: Hyperinflation of the lungs with flattening of the diaphragm on the lateral view  suggest the possibility of COPD/emphysema. No other acute abnormalities. Electronically Signed   By: Dorise Bullion III M.D   On: 09/26/2019 12:22    Procedures Procedures (including critical care time)  Medications Ordered in UC Medications - No data to display  Initial Impression / Assessment and Plan / UC Course  I  have reviewed the triage vital signs and the nursing notes.  Pertinent labs & imaging results that were available during my care of the patient were reviewed by me and considered in my medical decision making (see chart for details).     Patient febrile, nontoxic, without respiratory distress.  Rapid Covid done in office: Negative-we will defer PCR as patient already had negative test.  Chest x-ray done office, reviewed by me radiology, compared to previous from 05/31/2019: Hyperinflation of lungs with flattening of diaphragm on the lateral view suggestive of COPD/emphysema.  Discussed importance of smoking sensation, particularly with COPD flares and bronchitis.  Will start antibiotic, defer repeat steroids as patient just completed course and has adequate O2 saturations.  Provide benzonatate, encourage increase albuterol use for chest tightness.  Return precautions discussed, patient verbalized understanding and is agreeable to plan. Final Clinical Impressions(s) / UC Diagnoses   Final diagnoses:  Acute bronchitis, unspecified organism  COPD exacerbation ([place])  Encounter for smoking cessation counseling     Discharge Instructions     Take antibiotic twice daily with food. Important take allergy medication every day as well. Please use albuterol inhaler up to every 4 hours as needed: Important to use spacing device with this. No smoking while you are taking these medications! Go to ER for worsening cough, breathing, fever, chest pain.    ED Prescriptions    Medication Sig Dispense Auth. Provider   doxycycline (VIBRAMYCIN) 100 MG capsule Take 1 capsule (100 mg  total) by mouth 2 (two) times daily. 20 capsule Hall-Potvin, [place], PA-C   benzonatate (TESSALON) 100 MG capsule Take 1 capsule (100 mg total) by mouth every 8 (eight) hours. 21 capsule Hall-Potvin, [place], PA-C   cetirizine (ZYRTEC ALLERGY) 10 MG tablet Take 1 tablet (10 mg total) by mouth daily. 30 tablet Hall-Potvin, [place], PA-C     PDMP not reviewed this encounter.   Hall-Potvin, [place], [place] 09/26/19 1226

## 2019-09-26 NOTE — Discharge Instructions (Signed)
Take antibiotic twice daily with food. Important take allergy medication every day as well. Please use albuterol inhaler up to every 4 hours as needed: Important to use spacing device with this. No smoking while you are taking these medications! Go to ER for worsening cough, breathing, fever, chest pain.

## 2019-10-24 ENCOUNTER — Encounter: Payer: Self-pay | Admitting: Emergency Medicine

## 2019-10-24 ENCOUNTER — Other Ambulatory Visit: Payer: Self-pay

## 2019-10-24 ENCOUNTER — Ambulatory Visit
Admission: EM | Admit: 2019-10-24 | Discharge: 2019-10-24 | Disposition: A | Discharge status: Home / Self Care | Payer: Medicare Other | Attending: Emergency Medicine | Admitting: Emergency Medicine

## 2019-10-24 DIAGNOSIS — J42 Unspecified chronic bronchitis: Secondary | ICD-10-CM | POA: Diagnosis not present

## 2019-10-24 DIAGNOSIS — J449 Chronic obstructive pulmonary disease, unspecified: Secondary | ICD-10-CM

## 2019-10-24 MED ORDER — PREDNISONE 20 MG PO TABS
40.0000 mg | ORAL_TABLET | Freq: Every day | ORAL | 0 refills | Status: DC
Start: 2019-10-24 — End: 2019-10-29

## 2019-10-24 MED ORDER — ALBUTEROL SULFATE HFA 108 (90 BASE) MCG/ACT IN AERS: 2.0000 | INHALATION_SPRAY | RESPIRATORY_TRACT | 0 refills | Status: DC | PRN

## 2019-10-24 MED ORDER — BENZONATATE 100 MG PO CAPS
100.0000 mg | ORAL_CAPSULE | Freq: Three times a day (TID) | ORAL | 0 refills | Status: DC
Start: 2019-10-24 — End: 2019-11-24

## 2019-10-24 NOTE — ED Triage Notes (Signed)
Pt here for cough and congestion; pt with chronic bronchitis; pt seen here for same recently

## 2019-10-24 NOTE — Discharge Instructions (Addendum)
Take steroid once every morning. Use inhalers as instructed. Very important that you follow-up with your lung doctor for further evaluation.

## 2019-10-24 NOTE — ED Provider Notes (Addendum)
[facility]    CSN: [id number] Arrival date & time: 10/24/19  1018      History   Chief Complaint Chief Complaint  Patient presents with  . Cough    HPI Gavin Kane is a 64 y.o. male with history of hypertension, COPD presenting for cough.  Endorsing mild production without blood.  Last seen by me on 09/26/2019: Treated for bronchitis with benzonatate, doxycycline, cetirizine.  Patient tolerated this well and had improvement, though states cough and chest tightness returned this last week.  No chest pain, shortness of breath, lower leg swelling or abdominal pain.  Denies fever, known sick contacts, nausea or vomiting, abdominal pain.  Has not followed up with pulmonology yet.   Past Medical History:  Diagnosis Date  . COPD (chronic obstructive pulmonary disease) ([place])   . HTN (hypertension)     Patient Active Problem List   Diagnosis Date Noted  . Healthcare maintenance 11/20/2018  . Screening for AAA (abdominal aortic aneurysm) 11/20/2018  . COPD exacerbation ([place]) 06/20/2018  . Cough 02/09/2018  . Encounter for smoking cessation counseling 11/17/2017  . Non compliance w medication regimen 04/19/2016  . Benzodiazepine abuse ([place]) 12/28/2015  . Anxiety state 05/18/2014  . Chronic fatigue 12/01/2012  . Tobacco abuse counseling 11/27/2012  . COPD (chronic obstructive pulmonary disease) ([place]) 10/13/2012  . DOE (dyspnea on exertion) 10/11/2012  . Nonspecific ST-T changes 10/11/2012  . Hypertension 10/11/2012    Past Surgical History:  Procedure Laterality Date  . HEMORRHOID SURGERY         Home Medications    Prior to Admission medications   Medication Sig Start Date End Date Taking? Authorizing Provider  albuterol (VENTOLIN HFA) 108 (90 Base) MCG/ACT inhaler Inhale 2 puffs into the lungs every 4 (four) hours as needed for wheezing or shortness of breath. SHORTNESS OF BREATH OR COUGH 10/24/19   Hall-Potvin, [place], PA-C  benzonatate (TESSALON) 100 MG  capsule Take 1 capsule (100 mg total) by mouth every 8 (eight) hours. 10/24/19   Hall-Potvin, [place], PA-C  cetirizine (ZYRTEC ALLERGY) 10 MG tablet Take 1 tablet (10 mg total) by mouth daily. 09/26/19   Hall-Potvin, [place], PA-C  losartan (COZAAR) 100 MG tablet Take 1 tablet (100 mg total) by mouth at bedtime. 11/26/18   Zenia Resides, MD  mometasone-formoterol (DULERA) 200-5 MCG/ACT AERO Inhale 2 puffs into the lungs 2 (two) times daily. 09/13/19   Nuala Alpha, DO  predniSONE (DELTASONE) 20 MG tablet Take 2 tablets (40 mg total) by mouth daily for 5 days. 10/24/19 10/29/19  Hall-Potvin, [place], PA-C  tiotropium (SPIRIVA HANDIHALER) 18 MCG inhalation capsule Use on capsule with 2 oral inhalations once per day 04/16/19   Nuala Alpha, DO  ipratropium (ATROVENT) 0.06 % nasal spray Place 2 sprays into both nostrils 4 (four) times daily. Patient not taking: Reported on 06/03/2019 11/23/18 06/22/19  Arturo Morton    Family History Family History  Problem Relation Age of Onset  . Cancer Other        Aunt    Social History Social History   Tobacco Use  . Smoking status: Current Some Day Smoker    Packs/day: 0.50    Years: 48.00    Pack years: 24.00    Types: Cigarettes    Start date: 05/06/1970  . Smokeless tobacco: Former Systems developer  . Tobacco comment: 7-8 per day   Vaping Use  . Vaping Use: Never used  Substance Use Topics  . Alcohol use: Not Currently  Alcohol/week: 0.0 standard drinks    Comment: Social  . Drug use: No     Allergies   Patient has no known allergies.   Review of Systems As per HPI   Physical Exam Triage Vital Signs ED Triage Vitals  Enc Vitals Group     BP      Pulse      Resp      Temp      Temp src      SpO2      Weight      Height      Head Circumference      Peak Flow      Pain Score      Pain Loc      Pain Edu?      Excl. in [place]?    No data found.  Updated Vital Signs BP (!) 148/85 (BP Location: Left Arm)   Temp (!) 97.4 F  (36.3 C)   Resp 18   SpO2 95%   Visual Acuity Right Eye Distance:   Left Eye Distance:   Bilateral Distance:    Right Eye Near:   Left Eye Near:    Bilateral Near:     Physical Exam Constitutional:      General: He is not in acute distress.    Appearance: He is not toxic-appearing or diaphoretic.  HENT:     Head: Normocephalic and atraumatic.     Mouth/Throat:     Mouth: Mucous membranes are moist.     Pharynx: Oropharynx is clear.  Eyes:     General: No scleral icterus.    Conjunctiva/sclera: Conjunctivae normal.     Pupils: Pupils are equal, round, and reactive to light.  Neck:     Comments: Trachea midline, negative JVD Cardiovascular:     Rate and Rhythm: Normal rate and regular rhythm.  Pulmonary:     Effort: Pulmonary effort is normal. No respiratory distress.     Breath sounds: Wheezing present.     Comments: Air entry bilaterally without prolonged expiratory phase Musculoskeletal:     Cervical back: Neck supple. No tenderness.  Lymphadenopathy:     Cervical: No cervical adenopathy.  Skin:    Capillary Refill: Capillary refill takes less than 2 seconds.     Coloration: Skin is not jaundiced or pale.     Findings: No rash.  Neurological:     Mental Status: He is alert and oriented to person, place, and time.      UC Treatments / Results  Labs (all labs ordered are listed, but only abnormal results are displayed) Labs Reviewed - No data to display  EKG   Radiology No results found.  Procedures Procedures (including critical care time)  Medications Ordered in UC Medications - No data to display  Initial Impression / Assessment and Plan / UC Course  I have reviewed the triage vital signs and the nursing notes.  Pertinent labs & imaging results that were available during my care of the patient were reviewed by me and considered in my medical decision making (see chart for details).     Patient afebrile, nontoxic, and hemodynamically stable.   No respiratory distress and oxygen saturations are at baseline.  Patient is well-known to me due to numerous visits for same complaint.  Lung sounds improved from last visit.  Will defer repeat radiography, treat for bronchitis, and have patient follow-up with pulmonology.  Stressed importance of following up with lung specialist for further evaluation and management  of chronic conditions.  Return precautions discussed, patient verbalized understanding and is agreeable to plan. Final Clinical Impressions(s) / UC Diagnoses   Final diagnoses:  Chronic bronchitis, unspecified chronic bronchitis type ([place])  Chronic obstructive pulmonary disease, unspecified COPD type ([place])     Discharge Instructions     Take steroid once every morning. Use inhalers as instructed. Very important that you follow-up with your lung doctor for further evaluation.    ED Prescriptions    Medication Sig Dispense Auth. Provider   albuterol (VENTOLIN HFA) 108 (90 Base) MCG/ACT inhaler Inhale 2 puffs into the lungs every 4 (four) hours as needed for wheezing or shortness of breath. SHORTNESS OF BREATH OR COUGH 18 g Hall-Potvin, [place], PA-C   benzonatate (TESSALON) 100 MG capsule Take 1 capsule (100 mg total) by mouth every 8 (eight) hours. 21 capsule Hall-Potvin, [place], PA-C   predniSONE (DELTASONE) 20 MG tablet Take 2 tablets (40 mg total) by mouth daily for 5 days. 10 tablet Hall-Potvin, [place], PA-C     PDMP not reviewed this encounter.   Hall-Potvin, [place], PA-C 10/24/19 1054    Hall-Potvin, [place], [place] 10/24/19 1055

## 2019-10-29 ENCOUNTER — Telehealth: Payer: Self-pay | Admitting: Pharmacist

## 2019-10-29 ENCOUNTER — Ambulatory Visit
Admission: EM | Admit: 2019-10-29 | Discharge: 2019-10-29 | Disposition: A | Discharge status: Home / Self Care | Payer: Medicare Other | Attending: Physician Assistant | Admitting: Physician Assistant

## 2019-10-29 DIAGNOSIS — J441 Chronic obstructive pulmonary disease with (acute) exacerbation: Secondary | ICD-10-CM | POA: Diagnosis not present

## 2019-10-29 MED ORDER — DOXYCYCLINE HYCLATE 100 MG PO CAPS
100.0000 mg | ORAL_CAPSULE | Freq: Two times a day (BID) | ORAL | 0 refills | Status: DC
Start: 2019-10-29 — End: 2019-11-24

## 2019-10-29 NOTE — ED Triage Notes (Signed)
Pt c/o SOB and chest congestion for over a week. States completed predisone with no relief. Coughing up lots of deep yellow sputum.

## 2019-10-29 NOTE — Telephone Encounter (Signed)
Call made by Domingo Pulse, PharmD Candidate.   Called patient to discuss tobacco cessation progress. Patient appeared motivated and expressed interest in meeting with Dr. Valentina Lucks to discuss a plan to quit smoking. Provided patient with main office number to schedule a follow-up appointment.

## 2019-10-29 NOTE — ED Provider Notes (Signed)
[facility]    CSN: [id number] Arrival date & time: 10/29/19  1751      History   Chief Complaint Chief Complaint  Patient presents with  . Shortness of Breath    HPI Gavin Kane is a 64 y.o. male.   64 year old male comes back for increase in sputum production after being seen 10/24/2019. At the time, had cough and wheezing, and was started on prednisone. Finished course without relief. Now with increased sputum production. Denies fever, chills, body aches. Has not followed up with pulmonology.      Past Medical History:  Diagnosis Date  . COPD (chronic obstructive pulmonary disease) ([place])   . HTN (hypertension)     Patient Active Problem List   Diagnosis Date Noted  . Healthcare maintenance 11/20/2018  . Screening for AAA (abdominal aortic aneurysm) 11/20/2018  . COPD exacerbation ([place]) 06/20/2018  . Cough 02/09/2018  . Encounter for smoking cessation counseling 11/17/2017  . Non compliance w medication regimen 04/19/2016  . Benzodiazepine abuse ([place]) 12/28/2015  . Anxiety state 05/18/2014  . Chronic fatigue 12/01/2012  . Tobacco abuse counseling 11/27/2012  . COPD (chronic obstructive pulmonary disease) ([place]) 10/13/2012  . DOE (dyspnea on exertion) 10/11/2012  . Nonspecific ST-T changes 10/11/2012  . Hypertension 10/11/2012    Past Surgical History:  Procedure Laterality Date  . HEMORRHOID SURGERY         Home Medications    Prior to Admission medications   Medication Sig Start Date End Date Taking? Authorizing Provider  albuterol (VENTOLIN HFA) 108 (90 Base) MCG/ACT inhaler Inhale 2 puffs into the lungs every 4 (four) hours as needed for wheezing or shortness of breath. SHORTNESS OF BREATH OR COUGH 10/24/19   Hall-Potvin, [place], PA-C  benzonatate (TESSALON) 100 MG capsule Take 1 capsule (100 mg total) by mouth every 8 (eight) hours. 10/24/19   Hall-Potvin, [place], PA-C  cetirizine (ZYRTEC ALLERGY) 10 MG tablet Take 1 tablet (10 mg  total) by mouth daily. 09/26/19   Hall-Potvin, [place], PA-C  doxycycline (VIBRAMYCIN) 100 MG capsule Take 1 capsule (100 mg total) by mouth 2 (two) times daily. 10/29/19   Tasia Catchings, Darrin Koman V, PA-C  losartan (COZAAR) 100 MG tablet Take 1 tablet (100 mg total) by mouth at bedtime. 11/26/18   Zenia Resides, MD  mometasone-formoterol (DULERA) 200-5 MCG/ACT AERO Inhale 2 puffs into the lungs 2 (two) times daily. 09/13/19   Nuala Alpha, DO  tiotropium (SPIRIVA HANDIHALER) 18 MCG inhalation capsule Use on capsule with 2 oral inhalations once per day 04/16/19   Nuala Alpha, DO  ipratropium (ATROVENT) 0.06 % nasal spray Place 2 sprays into both nostrils 4 (four) times daily. Patient not taking: Reported on 06/03/2019 11/23/18 06/22/19  Arturo Morton    Family History Family History  Problem Relation Age of Onset  . Cancer Other        Aunt    Social History Social History   Tobacco Use  . Smoking status: Current Some Day Smoker    Packs/day: 0.50    Years: 48.00    Pack years: 24.00    Types: Cigarettes    Start date: 05/06/1970  . Smokeless tobacco: Former Systems developer  . Tobacco comment: 7-8 per day   Vaping Use  . Vaping Use: Never used  Substance Use Topics  . Alcohol use: Not Currently    Alcohol/week: 0.0 standard drinks    Comment: Social  . Drug use: No     Allergies  Patient has no known allergies.   Review of Systems Review of Systems  Reason unable to perform ROS: See HPI as above.     Physical Exam Triage Vital Signs ED Triage Vitals  Enc Vitals Group     BP 10/29/19 1803 (!) 159/96     Pulse Rate 10/29/19 1803 (!) 111     Resp 10/29/19 1803 (!) 26     Temp 10/29/19 1803 97.8 F (36.6 C)     Temp Source 10/29/19 1802 Oral     SpO2 10/29/19 1803 94 %     Weight --      Height --      Head Circumference --      Peak Flow --      Pain Score 10/29/19 1813 9     Pain Loc --      Pain Edu? --      Excl. in [place]? --    No data found.  Updated Vital Signs BP  (!) 159/96 (BP Location: Left Arm)   Pulse (!) 111   Temp 97.8 F (36.6 C) (Oral)   Resp (!) 26   SpO2 94%   Physical Exam Constitutional:      General: He is not in acute distress.    Appearance: Normal appearance. He is well-developed. He is not toxic-appearing or diaphoretic.  HENT:     Head: Normocephalic and atraumatic.  Eyes:     Conjunctiva/sclera: Conjunctivae normal.     Pupils: Pupils are equal, round, and reactive to light.  Cardiovascular:     Rate and Rhythm: Normal rate and regular rhythm.  Pulmonary:     Effort: Pulmonary effort is normal. No respiratory distress.     Comments: Speaking in full sentences without difficulty. Grunting, which is patient's baseline. Decreased air movement without rhonchi/rales. Mild wheezing.  Albuterol 2 puffs x 1: improved air movement. LCTAB Musculoskeletal:     Cervical back: Normal range of motion and neck supple.  Skin:    General: Skin is warm and dry.  Neurological:     Mental Status: He is alert and oriented to person, place, and time.      UC Treatments / Results  Labs (all labs ordered are listed, but only abnormal results are displayed) Labs Reviewed - No data to display  EKG   Radiology No results found.  Procedures Procedures (including critical care time)  Medications Ordered in UC Medications - No data to display  Initial Impression / Assessment and Plan / UC Course  I have reviewed the triage vital signs and the nursing notes.  Pertinent labs & imaging results that were available during my care of the patient were reviewed by me and considered in my medical decision making (see chart for details).    Doxycycline for increase sputum production. He has been seen at [facility] for COPD exacerbation 5-6 times in the past 6 months. Discussed at length that patient needs follow up with pulmonology. Return precautions given.  Final Clinical Impressions(s) / UC Diagnoses   Final diagnoses:  COPD exacerbation  [facility])   ED Prescriptions    Medication Sig Dispense Auth. Provider   doxycycline (VIBRAMYCIN) 100 MG capsule Take 1 capsule (100 mg total) by mouth 2 (two) times daily. 14 capsule Ok Edwards, PA-C     PDMP not reviewed this encounter.   Ok Edwards, PA-C 10/29/19 1905

## 2019-10-29 NOTE — Discharge Instructions (Signed)
Doxycycline as directed. Continue albuterol, normal inhalers. Follow up with pulmonology for further evaluation.

## 2019-11-10 ENCOUNTER — Telehealth: Payer: Self-pay | Admitting: Family Medicine

## 2019-11-10 NOTE — Telephone Encounter (Signed)
Patient stopped by has called pharmacy to get refill of [place]

## 2019-11-10 NOTE — Telephone Encounter (Signed)
Patient stopped by his pharmacy submitted request for refill of his RX of Albuterol this was done by pharmacy on 11/06/19.  Still has not rec'd  refill for this medicine.  The pharmacy he uses his [facility] [address]  [place].  Advised will send this request to doctor through me.  If any questions please call patient.  [phone].  Needs this medcine having problems with breathing.

## 2019-11-12 ENCOUNTER — Other Ambulatory Visit: Payer: Self-pay | Admitting: Family Medicine

## 2019-11-12 DIAGNOSIS — J432 Centrilobular emphysema: Secondary | ICD-10-CM

## 2019-11-12 MED ORDER — ALBUTEROL SULFATE HFA 108 (90 BASE) MCG/ACT IN AERS: 2.0000 | INHALATION_SPRAY | RESPIRATORY_TRACT | 0 refills | Status: DC | PRN

## 2019-11-19 ENCOUNTER — Emergency Department (HOSPITAL_COMMUNITY): Payer: Medicare Other

## 2019-11-19 ENCOUNTER — Other Ambulatory Visit: Payer: Self-pay

## 2019-11-19 ENCOUNTER — Encounter (HOSPITAL_COMMUNITY): Payer: Self-pay

## 2019-11-19 ENCOUNTER — Inpatient Hospital Stay (HOSPITAL_COMMUNITY)
Admission: EM | Admit: 2019-11-19 | Discharge: 2019-11-24 | DRG: 190 | Disposition: A | Discharge status: Home / Self Care | Payer: Medicare Other | Attending: Internal Medicine | Admitting: Internal Medicine

## 2019-11-19 DIAGNOSIS — J9601 Acute respiratory failure with hypoxia: Secondary | ICD-10-CM | POA: Diagnosis present

## 2019-11-19 DIAGNOSIS — J9621 Acute and chronic respiratory failure with hypoxia: Secondary | ICD-10-CM | POA: Diagnosis present

## 2019-11-19 DIAGNOSIS — J441 Chronic obstructive pulmonary disease with (acute) exacerbation: Secondary | ICD-10-CM | POA: Diagnosis present

## 2019-11-19 DIAGNOSIS — Z79899 Other long term (current) drug therapy: Secondary | ICD-10-CM

## 2019-11-19 DIAGNOSIS — F411 Generalized anxiety disorder: Secondary | ICD-10-CM | POA: Diagnosis present

## 2019-11-19 DIAGNOSIS — I10 Essential (primary) hypertension: Secondary | ICD-10-CM | POA: Diagnosis present

## 2019-11-19 DIAGNOSIS — Z20822 Contact with and (suspected) exposure to covid-19: Secondary | ICD-10-CM | POA: Diagnosis present

## 2019-11-19 DIAGNOSIS — Z716 Tobacco abuse counseling: Secondary | ICD-10-CM

## 2019-11-19 DIAGNOSIS — F17210 Nicotine dependence, cigarettes, uncomplicated: Secondary | ICD-10-CM | POA: Diagnosis present

## 2019-11-19 DIAGNOSIS — F419 Anxiety disorder, unspecified: Secondary | ICD-10-CM | POA: Diagnosis present

## 2019-11-19 DIAGNOSIS — R0609 Other forms of dyspnea: Secondary | ICD-10-CM | POA: Diagnosis present

## 2019-11-19 LAB — CBC
HCT: 44.1 % (ref 39.0–52.0)
Hemoglobin: 13.6 g/dL (ref 13.0–17.0)
MCH: 28.3 pg (ref 26.0–34.0)
MCHC: 30.8 g/dL (ref 30.0–36.0)
MCV: 91.9 fL (ref 80.0–100.0)
Platelets: 275 10*3/uL (ref 150–400)
RBC: 4.8 MIL/uL (ref 4.22–5.81)
RDW: 14 % (ref 11.5–15.5)
WBC: 8 10*3/uL (ref 4.0–10.5)
nRBC: 0 % (ref 0.0–0.2)

## 2019-11-19 LAB — BASIC METABOLIC PANEL
Anion gap: 8 (ref 5–15)
BUN: 18 mg/dL (ref 8–23)
CO2: 29 mmol/L (ref 22–32)
Calcium: 9.7 mg/dL (ref 8.9–10.3)
Chloride: 107 mmol/L (ref 98–111)
Creatinine, Ser: 0.9 mg/dL (ref 0.61–1.24)
GFR calc Af Amer: >60 mL/min (ref >60)
GFR calc non Af Amer: >60 mL/min (ref >60)
Glucose, Bld: 93 mg/dL (ref 70–99)
Potassium: 4.7 mmol/L (ref 3.5–5.1)
Sodium: 144 mmol/L (ref 135–145)

## 2019-11-19 LAB — SARS CORONAVIRUS 2 BY RT PCR (HOSPITAL ORDER, PERFORMED IN ~~LOC~~ HOSPITAL LAB): SARS Coronavirus 2: NEGATIVE

## 2019-11-19 MED ORDER — MAGNESIUM SULFATE IN D5W 1-5 GM/100ML-% IV SOLN
1.0000 g | Freq: Once | INTRAVENOUS | Status: AC
  Administered 2019-11-19: 1 g via INTRAVENOUS
  Filled 2019-11-19: qty 100

## 2019-11-19 MED ORDER — SODIUM CHLORIDE 0.9 % IV SOLN
100.0000 mg | Freq: Once | INTRAVENOUS | Status: AC
  Administered 2019-11-19: 100 mg via INTRAVENOUS
  Filled 2019-11-19: qty 100

## 2019-11-19 MED ORDER — ALBUTEROL SULFATE HFA 108 (90 BASE) MCG/ACT IN AERS
8.0000 | INHALATION_SPRAY | RESPIRATORY_TRACT | Status: AC
  Administered 2019-11-19 (×3): 8 via RESPIRATORY_TRACT
  Filled 2019-11-19: qty 6.7

## 2019-11-19 MED ORDER — METHYLPREDNISOLONE SODIUM SUCC 125 MG IJ SOLR
125.0000 mg | Freq: Once | INTRAMUSCULAR | Status: AC
  Administered 2019-11-19: 125 mg via INTRAVENOUS
  Filled 2019-11-19: qty 2

## 2019-11-19 MED ORDER — IPRATROPIUM BROMIDE HFA 17 MCG/ACT IN AERS
4.0000 | INHALATION_SPRAY | RESPIRATORY_TRACT | Status: AC
  Administered 2019-11-19 (×3): 4 via RESPIRATORY_TRACT
  Filled 2019-11-19: qty 12.9

## 2019-11-19 MED ORDER — MAGNESIUM SULFATE 50 % IJ SOLN: 1.0000 g | Freq: Once | INTRAMUSCULAR | Status: DC

## 2019-11-19 MED ORDER — ALBUTEROL SULFATE (2.5 MG/3ML) 0.083% IN NEBU: 5.0000 mg | INHALATION_SOLUTION | Freq: Once | RESPIRATORY_TRACT | Status: DC

## 2019-11-19 NOTE — H&P (Signed)
History and Physical   Gavin Kane:[id number] DOB: [birth year] DOA: 11/19/2019  Referring MD/NP/PA: Dr. Ron Parker  PCP: Gerlene Fee, DO   Outpatient Specialists: None  Patient coming from: Home  Chief Complaint: Shortness of breath  HPI: Gavin Kane is a 64 y.o. male with medical history significant of hypertension and COPD who presented with shortness of breath cough and wheezing.  Patient was found to be hypoxic on room air especially with activities.  Was aggressively treated with IV Solu-Medrol as well as nebulizer in the ER.  Patient continues to have work of breathing with diffuse expiratory wheezing.  He has not completely resolved.  With mildly new oxygen requirement about 2 L patient is being admitted to the hospital for complete treatment of his COPD exacerbation.  Denied any sick contact.  No hemoptysis.  Denied any productive sputum.  Patient's chest x-ray showed no obvious infiltrates.  Patient being admitted for further work-up..  ED Course: Temperature 98.3 blood pressure 162/105 pulse 110 respirate of 30 oxygen sat 91% on room air.  CBC and chemistry all entirely within normal.  COVID-19 negative.  Chest x-ray showed no acute findings.  Patient being admitted for observation and treatment of COPD exacerbation.  Review of Systems: As per HPI otherwise 10 point review of systems negative.    Past Medical History:  Diagnosis Date  . COPD (chronic obstructive pulmonary disease) ([place])   . HTN (hypertension)     Past Surgical History:  Procedure Laterality Date  . HEMORRHOID SURGERY       reports that he has been smoking cigarettes. He started smoking about 49 years ago. He has a 24.00 pack-year smoking history. He has quit using smokeless tobacco. He reports previous alcohol use. He reports that he does not use drugs.  No Known Allergies  Family History  Problem Relation Age of Onset  . Cancer Other        Aunt     Prior to Admission medications    Medication Sig Start Date End Date Taking? Authorizing Provider  albuterol (VENTOLIN HFA) 108 (90 Base) MCG/ACT inhaler Inhale 2 puffs into the lungs every 4 (four) hours as needed for wheezing or shortness of breath. SHORTNESS OF BREATH OR COUGH 11/12/19  Yes Hensel, Jamal Collin, MD  losartan (COZAAR) 100 MG tablet Take 1 tablet (100 mg total) by mouth at bedtime. 11/26/18  Yes Hensel, Jamal Collin, MD  mometasone-formoterol (DULERA) 200-5 MCG/ACT AERO Inhale 2 puffs into the lungs 2 (two) times daily. 09/13/19  Yes Lockamy, Timothy, DO  tiotropium (SPIRIVA HANDIHALER) 18 MCG inhalation capsule Use on capsule with 2 oral inhalations once per day Patient taking differently: Place 18 mcg into inhaler and inhale daily. Use on capsule with 2 oral inhalations once per day 04/16/19  Yes Lockamy, Timothy, DO  benzonatate (TESSALON) 100 MG capsule Take 1 capsule (100 mg total) by mouth every 8 (eight) hours. Patient not taking: Reported on 11/19/2019 10/24/19   Hall-Potvin, [place], PA-C  cetirizine (ZYRTEC ALLERGY) 10 MG tablet Take 1 tablet (10 mg total) by mouth daily. Patient not taking: Reported on 11/19/2019 09/26/19   Hall-Potvin, [place], PA-C  doxycycline (VIBRAMYCIN) 100 MG capsule Take 1 capsule (100 mg total) by mouth 2 (two) times daily. Patient not taking: Reported on 11/19/2019 10/29/19   Ok Edwards, PA-C  ipratropium (ATROVENT) 0.06 % nasal spray Place 2 sprays into both nostrils 4 (four) times daily. Patient not taking: Reported on 06/03/2019 11/23/18 06/22/19  Ok Edwards, PA-C  Physical Exam: Vitals:   11/19/19 1745 11/19/19 1815 11/19/19 1830 11/19/19 2004  BP: (!) 156/93  (!) 162/105 (!) 154/96  Pulse: 89 (!) 105 94 (!) 107  Resp: 18 (!) 30 (!) 23 20  Temp:      TempSrc:      SpO2: 96% 96% 97% 96%  Weight:      Height:          Constitutional: Anxious, mildly tachypneic Vitals:   11/19/19 1745 11/19/19 1815 11/19/19 1830 11/19/19 2004  BP: (!) 156/93  (!) 162/105 (!) 154/96  Pulse:  89 (!) 105 94 (!) 107  Resp: 18 (!) 30 (!) 23 20  Temp:      TempSrc:      SpO2: 96% 96% 97% 96%  Weight:      Height:       Eyes: PERRL, lids and conjunctivae normal ENMT: Mucous membranes are moist. Posterior pharynx clear of any exudate or lesions.Normal dentition.  Neck: normal, supple, no masses, no thyromegaly Respiratory: Decreased air entry bilaterally, marked expiratory wheezing, no rales, no rhonchi, use of accessory muscle of respiration Cardiovascular: Sinus tachycardia, no murmurs / rubs / gallops. No extremity edema. 2+ pedal pulses. No carotid bruits.  Abdomen: no tenderness, no masses palpated. No hepatosplenomegaly. Bowel sounds positive.  Musculoskeletal: no clubbing / cyanosis. No joint deformity upper and lower extremities. Good ROM, no contractures. Normal muscle tone.  Skin: no rashes, lesions, ulcers. No induration Neurologic: CN 2-12 grossly intact. Sensation intact, DTR normal. Strength 5/5 in all 4.  Psychiatric: Normal judgment and insight. Alert and oriented x 3. Normal mood.     Labs on Admission: I have personally reviewed following labs and imaging studies  CBC: Recent Labs  Lab 11/19/19 1700  WBC 8.0  HGB 13.6  HCT 44.1  MCV 91.9  PLT 101   Basic Metabolic Panel: Recent Labs  Lab 11/19/19 1700  NA 144  K 4.7  CL 107  CO2 29  GLUCOSE 93  BUN 18  CREATININE 0.90  CALCIUM 9.7   GFR: Estimated Creatinine Clearance: 71.2 mL/min (by C-G formula based on SCr of 0.9 mg/dL). Liver Function Tests: No results for input(s): AST, ALT, ALKPHOS, BILITOT, PROT, ALBUMIN in the last 168 hours. No results for input(s): LIPASE, AMYLASE in the last 168 hours. No results for input(s): AMMONIA in the last 168 hours. Coagulation Profile: No results for input(s): INR, PROTIME in the last 168 hours. Cardiac Enzymes: No results for input(s): CKTOTAL, CKMB, CKMBINDEX, TROPONINI in the last 168 hours. BNP (last 3 results) No results for input(s): PROBNP in  the last 8760 hours. HbA1C: No results for input(s): HGBA1C in the last 72 hours. CBG: No results for input(s): GLUCAP in the last 168 hours. Lipid Profile: No results for input(s): CHOL, HDL, LDLCALC, TRIG, CHOLHDL, LDLDIRECT in the last 72 hours. Thyroid Function Tests: No results for input(s): TSH, T4TOTAL, FREET4, T3FREE, THYROIDAB in the last 72 hours. Anemia Panel: No results for input(s): VITAMINB12, FOLATE, FERRITIN, TIBC, IRON, RETICCTPCT in the last 72 hours. Urine analysis:    Component Value Date/Time   COLORURINE YELLOW 06/03/2019 1502   APPEARANCEUR CLEAR 06/03/2019 1502   LABSPEC 1.023 06/03/2019 1502   PHURINE 7.0 06/03/2019 1502   GLUCOSEU NEGATIVE 06/03/2019 1502   HGBUR NEGATIVE 06/03/2019 1502   BILIRUBINUR NEGATIVE 06/03/2019 1502   KETONESUR NEGATIVE 06/03/2019 1502   PROTEINUR NEGATIVE 06/03/2019 1502   UROBILINOGEN 2.0 (H) 08/16/2014 1624   NITRITE NEGATIVE 06/03/2019 1502  LEUKOCYTESUR NEGATIVE 06/03/2019 1502   Sepsis Labs: @LABRCNTIP (procalcitonin:4,lacticidven:4) ) Recent Results (from the past 240 hour(s))  SARS Coronavirus 2 by RT PCR (hospital order, performed in [facility] hospital lab) Nasopharyngeal Nasopharyngeal Swab     Status: None   Collection Time: 11/19/19  5:05 PM   Specimen: Nasopharyngeal Swab  Result Value Ref Range Status   SARS Coronavirus 2 NEGATIVE NEGATIVE Final    Comment: (NOTE) SARS-CoV-2 target nucleic acids are NOT DETECTED.  The SARS-CoV-2 RNA is generally detectable in upper and lower respiratory specimens during the acute phase of infection. The lowest concentration of SARS-CoV-2 viral copies this assay can detect is 250 copies / mL. A negative result does not preclude SARS-CoV-2 infection and should not be used as the sole basis for treatment or other patient management decisions.  A negative result may occur with improper specimen collection / handling, submission of specimen other than nasopharyngeal swab,  presence of viral mutation(s) within the areas targeted by this assay, and inadequate number of viral copies (<250 copies / mL). A negative result must be combined with clinical observations, patient history, and epidemiological information.  Fact Sheet for Patients:   StrictlyIdeas.no  Fact Sheet for Healthcare Providers: BankingDealers.co.za  This test is not yet approved or  cleared by the [place] FDA and has been authorized for detection and/or diagnosis of SARS-CoV-2 by FDA under an Emergency Use Authorization (EUA).  This EUA will remain in effect (meaning this test can be used) for the duration of the COVID-19 declaration under Section 564(b)(1) of the Act, 21 U.S.C. section 360bbb-3(b)(1), unless the authorization is terminated or revoked sooner.  Performed at [facility], [place] [address], [place], [place] [address]      Radiological Exams on Admission: DG Chest 2 View  Result Date: 11/19/2019 CLINICAL DATA:  Shortness of breath, chest pain, and cough for 1-2 weeks. History of smoking. EXAM: CHEST - 2 VIEW COMPARISON:  09/26/2019 FINDINGS: The cardiomediastinal silhouette is unchanged with normal heart size. The lungs remain hyperinflated with mild chronic peribronchial thickening. No confluent airspace opacity, edema, pleural effusion, or pneumothorax is identified. No acute osseous abnormality is seen. IMPRESSION: COPD without evidence of acute cardiopulmonary process. Electronically Signed   By: Logan Bores M.D.   On: 11/19/2019 13:19    EKG: Independently reviewed.  Sinus rhythm with no acute findings.  Assessment/Plan Principal Problem:   COPD exacerbation (HCC) Active Problems:   DOE (dyspnea on exertion)   Hypertension   Tobacco abuse counseling   Anxiety state   Acute on chronic respiratory failure with hypoxemia (HCC)     #1 acute exacerbation of COPD: Patient has acute respiratory  failure with mild hypoxia oxygen sats in the upper 80s.  Admit the patient.  Initiate the COPD gold protocol.  IV steroids nebulized treatment oxygen as needed.  Continue close monitoring.  #2  Hypertension: Continue home regimen.  #3 tobacco abuse: Counseling provided.  Will offer nicotine patch.  #4 anxiety disorder: Monitor and resume home regimen.   DVT prophylaxis: Lovenox Code Status: Full code Family Communication: No family at bedside Disposition Plan: Home Consults called: None Admission status: Observation  Severity of Illness: The appropriate patient status for this patient is OBSERVATION. Observation status is judged to be reasonable and necessary in order to provide the required intensity of service to ensure the patient's safety. The patient's presenting symptoms, physical exam findings, and initial radiographic and laboratory data in the context of their medical condition is felt  to place them at decreased risk for further clinical deterioration. Furthermore, it is anticipated that the patient will be medically stable for discharge from the hospital within 2 midnights of admission. The following factors support the patient status of observation.   " The patient's presenting symptoms include shortness of breath. " The physical exam findings include marked expiratory wheezing. " The initial radiographic and laboratory data are mostly normal.     Tajay Muzzy,LAWAL MD [facility] Pager [phone]  If 7PM-7AM, please contact night-coverage [email or web address] Password [email or web address]  11/19/2019, 8:30 PM

## 2019-11-19 NOTE — ED Notes (Signed)
Pt ambulated with pulse ox. SpO2 dropped to 88% and pt became more SHOB. Pt settled back in bed and SpO2 bumped back up to 95%.

## 2019-11-19 NOTE — ED Provider Notes (Signed)
[facility] DEPT Provider Note   CSN: [id number] Arrival date & time: 11/19/19  1120     History Chief Complaint  Patient presents with  . Shortness of Breath  . COPD    Gavin Kane is a 64 y.o. male.   Shortness of Breath Severity:  Severe Onset quality:  Gradual Progression:  Waxing and waning Chronicity:  Chronic Context: URI   Relieved by:  Nothing Worsened by:  Exertion Ineffective treatments:  Inhaler Associated symptoms: cough, sputum production and wheezing   Associated symptoms: no chest pain, no fever, no headaches, no hemoptysis, no rash and no vomiting   COPD Associated symptoms include shortness of breath. Pertinent negatives include no chest pain and no headaches.       Past Medical History:  Diagnosis Date  . COPD (chronic obstructive pulmonary disease) ([place])   . HTN (hypertension)     Patient Active Problem List   Diagnosis Date Noted  . Acute on chronic respiratory failure with hypoxemia ([place]) 11/19/2019  . Healthcare maintenance 11/20/2018  . Screening for AAA (abdominal aortic aneurysm) 11/20/2018  . COPD exacerbation ([place]) 06/20/2018  . Cough 02/09/2018  . Encounter for smoking cessation counseling 11/17/2017  . Non compliance w medication regimen 04/19/2016  . Benzodiazepine abuse ([place]) 12/28/2015  . Anxiety state 05/18/2014  . Chronic fatigue 12/01/2012  . Tobacco abuse counseling 11/27/2012  . COPD (chronic obstructive pulmonary disease) ([place]) 10/13/2012  . DOE (dyspnea on exertion) 10/11/2012  . Nonspecific ST-T changes 10/11/2012  . Hypertension 10/11/2012    Past Surgical History:  Procedure Laterality Date  . HEMORRHOID SURGERY         Family History  Problem Relation Age of Onset  . Cancer Other        Aunt    Social History   Tobacco Use  . Smoking status: Current Some Day Smoker    Packs/day: 0.50    Years: 48.00    Pack years: 24.00    Types: Cigarettes    Start date:  05/06/1970  . Smokeless tobacco: Former Systems developer  . Tobacco comment: 7-8 per day   Vaping Use  . Vaping Use: Never used  Substance Use Topics  . Alcohol use: Not Currently    Alcohol/week: 0.0 standard drinks    Comment: Social  . Drug use: No    Home Medications Prior to Admission medications   Medication Sig Start Date End Date Taking? Authorizing Provider  albuterol (VENTOLIN HFA) 108 (90 Base) MCG/ACT inhaler Inhale 2 puffs into the lungs every 4 (four) hours as needed for wheezing or shortness of breath. SHORTNESS OF BREATH OR COUGH 11/12/19  Yes Hensel, Jamal Collin, MD  losartan (COZAAR) 100 MG tablet Take 1 tablet (100 mg total) by mouth at bedtime. 11/26/18  Yes Hensel, Jamal Collin, MD  mometasone-formoterol (DULERA) 200-5 MCG/ACT AERO Inhale 2 puffs into the lungs 2 (two) times daily. 09/13/19  Yes Lockamy, Timothy, DO  tiotropium (SPIRIVA HANDIHALER) 18 MCG inhalation capsule Use on capsule with 2 oral inhalations once per day Patient taking differently: Place 18 mcg into inhaler and inhale daily. Use on capsule with 2 oral inhalations once per day 04/16/19  Yes Lockamy, Timothy, DO  benzonatate (TESSALON) 100 MG capsule Take 1 capsule (100 mg total) by mouth every 8 (eight) hours. Patient not taking: Reported on 11/19/2019 10/24/19   Hall-Potvin, [place], PA-C  cetirizine (ZYRTEC ALLERGY) 10 MG tablet Take 1 tablet (10 mg total) by mouth daily. Patient not taking: Reported on  11/19/2019 09/26/19   Hall-Potvin, [place], PA-C  doxycycline (VIBRAMYCIN) 100 MG capsule Take 1 capsule (100 mg total) by mouth 2 (two) times daily. Patient not taking: Reported on 11/19/2019 10/29/19   Ok Edwards, PA-C  ipratropium (ATROVENT) 0.06 % nasal spray Place 2 sprays into both nostrils 4 (four) times daily. Patient not taking: Reported on 06/03/2019 11/23/18 06/22/19  Ok Edwards, PA-C    Allergies    Patient has no known allergies.  Review of Systems   Review of Systems  Constitutional: Negative for chills and  fever.  HENT: Negative for congestion and rhinorrhea.   Respiratory: Positive for cough, sputum production, chest tightness, shortness of breath and wheezing. Negative for hemoptysis.   Cardiovascular: Negative for chest pain and palpitations.  Gastrointestinal: Negative for diarrhea, nausea and vomiting.  Genitourinary: Negative for difficulty urinating and dysuria.  Musculoskeletal: Negative for arthralgias and back pain.  Skin: Negative for color change and rash.  Neurological: Negative for light-headedness and headaches.    Physical Exam Updated Vital Signs BP (!) 149/84 (BP Location: Left Arm)   Pulse 96   Temp 98.2 F (36.8 C) (Oral)   Resp 18   Ht 5\' 5"  (1.651 m)   Wt 63.5 kg   SpO2 96%   BMI 23.30 kg/m   Physical Exam Vitals and nursing note reviewed.  Constitutional:      General: He is not in acute distress.    Appearance: Normal appearance.  HENT:     Head: Normocephalic and atraumatic.     Nose: No rhinorrhea.  Eyes:     General:        Right eye: No discharge.        Left eye: No discharge.     Conjunctiva/sclera: Conjunctivae normal.  Cardiovascular:     Rate and Rhythm: Normal rate and regular rhythm.  Pulmonary:     Effort: Pulmonary effort is normal.     Breath sounds: No stridor. Decreased breath sounds and wheezing present. No rhonchi or rales.  Abdominal:     General: Abdomen is flat. There is no distension.     Palpations: Abdomen is soft.  Musculoskeletal:        General: No deformity or signs of injury.  Skin:    General: Skin is warm and dry.  Neurological:     General: No focal deficit present.     Mental Status: He is alert. Mental status is at baseline.     Motor: No weakness.  Psychiatric:        Mood and Affect: Mood normal.        Behavior: Behavior normal.        Thought Content: Thought content normal.     ED Results / Procedures / Treatments   Labs (all labs ordered are listed, but only abnormal results are  displayed) Labs Reviewed  COMPREHENSIVE METABOLIC PANEL - Abnormal; Notable for the following components:      Result Value   Glucose, Bld 163 (*)    All other components within normal limits  CBC - Abnormal; Notable for the following components:   RBC 4.15 (*)    Hemoglobin 11.8 (*)    HCT 37.5 (*)    All other components within normal limits  GLUCOSE, CAPILLARY - Abnormal; Notable for the following components:   Glucose-Capillary 240 (*)    All other components within normal limits  SARS CORONAVIRUS 2 BY RT PCR (HOSPITAL ORDER, [facility])  CBC  BASIC METABOLIC PANEL  HIV ANTIBODY (ROUTINE TESTING W REFLEX)    EKG None  Radiology DG Chest 2 View  Result Date: 11/19/2019 CLINICAL DATA:  Shortness of breath, chest pain, and cough for 1-2 weeks. History of smoking. EXAM: CHEST - 2 VIEW COMPARISON:  09/26/2019 FINDINGS: The cardiomediastinal silhouette is unchanged with normal heart size. The lungs remain hyperinflated with mild chronic peribronchial thickening. No confluent airspace opacity, edema, pleural effusion, or pneumothorax is identified. No acute osseous abnormality is seen. IMPRESSION: COPD without evidence of acute cardiopulmonary process. Electronically Signed   By: Logan Bores M.D.   On: 11/19/2019 13:19    Procedures Procedures (including critical care time)  Medications Ordered in ED Medications  albuterol (PROVENTIL) (2.5 MG/3ML) 0.083% nebulizer solution 5 mg (5 mg Nebulization Not Given 11/19/19 1503)  losartan (COZAAR) tablet 100 mg (has no administration in time range)  mometasone-formoterol (DULERA) 200-5 MCG/ACT inhaler 2 puff (2 puffs Inhalation Not Given 11/20/19 0919)  enoxaparin (LOVENOX) injection 40 mg (has no administration in time range)  0.45 % sodium chloride infusion ( Intravenous New Bag/Given 11/20/19 0127)  doxycycline (VIBRA-TABS) tablet 100 mg (100 mg Oral Given 11/20/19 0837)  methylPREDNISolone sodium succinate  (SOLU-MEDROL) 125 mg/2 mL injection 80 mg (80 mg Intravenous Given 11/20/19 0540)    Followed by  predniSONE (DELTASONE) tablet 40 mg (has no administration in time range)  albuterol (PROVENTIL) (2.5 MG/3ML) 0.083% nebulizer solution 2.5 mg (has no administration in time range)  ipratropium-albuterol (DUONEB) 0.5-2.5 (3) MG/3ML nebulizer solution 3 mL (3 mLs Nebulization Given 11/20/19 0729)  guaiFENesin-dextromethorphan (ROBITUSSIN DM) 100-10 MG/5ML syrup 5 mL (5 mLs Oral Given 11/20/19 0540)  amLODipine (NORVASC) tablet 10 mg (10 mg Oral Given 11/20/19 0837)  methylPREDNISolone sodium succinate (SOLU-MEDROL) 125 mg/2 mL injection 125 mg (125 mg Intravenous Given 11/19/19 1658)  albuterol (VENTOLIN HFA) 108 (90 Base) MCG/ACT inhaler 8 puff (8 puffs Inhalation Given 11/19/19 1904)  ipratropium (ATROVENT HFA) inhaler 4 puff (4 puffs Inhalation Given 11/19/19 1905)  doxycycline (VIBRAMYCIN) 100 mg in sodium chloride 0.9 % 250 mL IVPB (0 mg Intravenous Stopped 11/19/19 1904)  magnesium sulfate IVPB 1 g 100 mL (0 g Intravenous Stopped 11/19/19 1845)    ED Course  I have reviewed the triage vital signs and the nursing notes.  Pertinent labs & imaging results that were available during my care of the patient were reviewed by me and considered in my medical decision making (see chart for details).    MDM Rules/Calculators/A&P                          Acute on chronic COPD flare, worsening off and on over the last month.  Tried home remedies no solutions have helped.  No fevers chills sick contacts.  He will get treatments here.  Chest x-ray reviewed by myself and radiology shows no significant acute pathology.  He has poor air movement.  He will get duo nebs magnesium IV biotics and steroids.  Covid test will be done as he may need admission.  EKG for shortness of breath that is exertional however this is common with his COPD so I do not feel there is any need right now to work-up cardiac origin..  After  multiple breathing treatments and a negative Covid test the patient is still feeling symptomatic.  He cannot ambulate more than a few feet before he gets short of breath.  His oxygen dropped to 88.  He still does  not require supplemental oxygen but will require further management for COPD exacerbation.  Final Clinical Impression(s) / ED Diagnoses Final diagnoses:  COPD exacerbation [facility])    Rx / DC Orders ED Discharge Orders    None       Breck Coons, MD 11/20/19 1005

## 2019-11-19 NOTE — ED Triage Notes (Signed)
Patient c/o SOB and chest congestion x 2-3 days. Patient is having increased SOB. Patient does have a history of COPD. When speaking, patient has to stop and catch his breath. Sats 97% on room air and [id number]

## 2019-11-20 ENCOUNTER — Encounter (HOSPITAL_COMMUNITY): Payer: Self-pay | Admitting: Internal Medicine

## 2019-11-20 LAB — COMPREHENSIVE METABOLIC PANEL
ALT: 17 U/L (ref 0–44)
AST: 18 U/L (ref 15–41)
Albumin: 3.7 g/dL (ref 3.5–5.0)
Alkaline Phosphatase: 51 U/L (ref 38–126)
Anion gap: 7 (ref 5–15)
BUN: 16 mg/dL (ref 8–23)
CO2: 25 mmol/L (ref 22–32)
Calcium: 9.3 mg/dL (ref 8.9–10.3)
Chloride: 106 mmol/L (ref 98–111)
Creatinine, Ser: 0.7 mg/dL (ref 0.61–1.24)
GFR calc Af Amer: >60 mL/min (ref >60)
GFR calc non Af Amer: >60 mL/min (ref >60)
Glucose, Bld: 163 mg/dL — ABNORMAL HIGH (ref 70–99)
Potassium: 4.7 mmol/L (ref 3.5–5.1)
Sodium: 138 mmol/L (ref 135–145)
Total Bilirubin: 0.6 mg/dL (ref 0.3–1.2)
Total Protein: 6.5 g/dL (ref 6.5–8.1)

## 2019-11-20 LAB — CBC
HCT: 37.5 % — ABNORMAL LOW (ref 39.0–52.0)
Hemoglobin: 11.8 g/dL — ABNORMAL LOW (ref 13.0–17.0)
MCH: 28.4 pg (ref 26.0–34.0)
MCHC: 31.5 g/dL (ref 30.0–36.0)
MCV: 90.4 fL (ref 80.0–100.0)
Platelets: 241 10*3/uL (ref 150–400)
RBC: 4.15 MIL/uL — ABNORMAL LOW (ref 4.22–5.81)
RDW: 13.9 % (ref 11.5–15.5)
WBC: 6.4 10*3/uL (ref 4.0–10.5)
nRBC: 0 % (ref 0.0–0.2)

## 2019-11-20 LAB — HIV ANTIBODY (ROUTINE TESTING W REFLEX): HIV Screen 4th Generation wRfx: NONREACTIVE

## 2019-11-20 LAB — GLUCOSE, CAPILLARY
Glucose-Capillary: 240 mg/dL — ABNORMAL HIGH (ref 70–99)
Glucose-Capillary: 308 mg/dL — ABNORMAL HIGH (ref 70–99)

## 2019-11-20 MED ORDER — ALBUTEROL SULFATE HFA 108 (90 BASE) MCG/ACT IN AERS: 2.0000 | INHALATION_SPRAY | RESPIRATORY_TRACT | Status: DC | PRN

## 2019-11-20 MED ORDER — DOXYCYCLINE HYCLATE 100 MG PO TABS
100.0000 mg | ORAL_TABLET | Freq: Two times a day (BID) | ORAL | Status: DC
  Administered 2019-11-20 – 2019-11-24 (×9): 100 mg via ORAL
  Filled 2019-11-20 (×9): qty 1

## 2019-11-20 MED ORDER — PREDNISONE 20 MG PO TABS
40.0000 mg | ORAL_TABLET | Freq: Every day | ORAL | Status: AC
  Administered 2019-11-21 – 2019-11-24 (×4): 40 mg via ORAL
  Filled 2019-11-20 (×3): qty 2
  Filled 2019-11-20: qty 4

## 2019-11-20 MED ORDER — GUAIFENESIN-DM 100-10 MG/5ML PO SYRP
5.0000 mL | ORAL_SOLUTION | ORAL | Status: DC | PRN
  Administered 2019-11-20 – 2019-11-23 (×8): 5 mL via ORAL
  Filled 2019-11-20 (×8): qty 10

## 2019-11-20 MED ORDER — ENSURE ENLIVE PO LIQD
237.0000 mL | Freq: Two times a day (BID) | ORAL | Status: DC
  Administered 2019-11-21 – 2019-11-23 (×5): 237 mL via ORAL

## 2019-11-20 MED ORDER — TIOTROPIUM BROMIDE MONOHYDRATE 18 MCG IN CAPS: 18.0000 ug | ORAL_CAPSULE | Freq: Every day | RESPIRATORY_TRACT | Status: DC

## 2019-11-20 MED ORDER — ENOXAPARIN SODIUM 40 MG/0.4ML ~~LOC~~ SOLN
40.0000 mg | SUBCUTANEOUS | Status: DC
  Administered 2019-11-20 – 2019-11-24 (×5): 40 mg via SUBCUTANEOUS
  Filled 2019-11-20 (×5): qty 0.4

## 2019-11-20 MED ORDER — ALBUTEROL SULFATE (2.5 MG/3ML) 0.083% IN NEBU: 2.5000 mg | INHALATION_SOLUTION | RESPIRATORY_TRACT | Status: DC | PRN

## 2019-11-20 MED ORDER — METHYLPREDNISOLONE SODIUM SUCC 125 MG IJ SOLR
80.0000 mg | Freq: Four times a day (QID) | INTRAMUSCULAR | Status: AC
  Administered 2019-11-20 (×4): 80 mg via INTRAVENOUS
  Filled 2019-11-20 (×4): qty 2

## 2019-11-20 MED ORDER — ALPRAZOLAM 0.25 MG PO TABS
0.2500 mg | ORAL_TABLET | Freq: Three times a day (TID) | ORAL | Status: DC | PRN
  Administered 2019-11-20 – 2019-11-24 (×8): 0.25 mg via ORAL
  Filled 2019-11-20 (×9): qty 1

## 2019-11-20 MED ORDER — LOSARTAN POTASSIUM 50 MG PO TABS
100.0000 mg | ORAL_TABLET | Freq: Every day | ORAL | Status: DC
  Administered 2019-11-20 – 2019-11-23 (×4): 100 mg via ORAL
  Filled 2019-11-20 (×5): qty 2

## 2019-11-20 MED ORDER — SODIUM CHLORIDE 0.45 % IV SOLN: INTRAVENOUS | Status: DC

## 2019-11-20 MED ORDER — IPRATROPIUM-ALBUTEROL 0.5-2.5 (3) MG/3ML IN SOLN
3.0000 mL | Freq: Four times a day (QID) | RESPIRATORY_TRACT | Status: DC
  Administered 2019-11-20 – 2019-11-24 (×19): 3 mL via RESPIRATORY_TRACT
  Filled 2019-11-20 (×19): qty 3

## 2019-11-20 MED ORDER — AMLODIPINE BESYLATE 10 MG PO TABS
10.0000 mg | ORAL_TABLET | Freq: Every day | ORAL | Status: DC
  Administered 2019-11-20 – 2019-11-24 (×5): 10 mg via ORAL
  Filled 2019-11-20 (×5): qty 1

## 2019-11-20 MED ORDER — MOMETASONE FURO-FORMOTEROL FUM 200-5 MCG/ACT IN AERO
2.0000 | INHALATION_SPRAY | Freq: Two times a day (BID) | RESPIRATORY_TRACT | Status: DC
  Administered 2019-11-20 – 2019-11-24 (×9): 2 via RESPIRATORY_TRACT
  Filled 2019-11-20: qty 8.8

## 2019-11-20 MED ORDER — ADULT MULTIVITAMIN W/MINERALS CH
1.0000 | ORAL_TABLET | Freq: Every day | ORAL | Status: DC
  Administered 2019-11-20 – 2019-11-24 (×5): 1 via ORAL
  Filled 2019-11-20 (×5): qty 1

## 2019-11-20 NOTE — Progress Notes (Signed)
Initial Nutrition Assessment  DOCUMENTATION CODES:   Not applicable  INTERVENTION:  Ensure Enlive po BID, each supplement provides 350 kcal and 20 grams of protein MVI with minerals daily Education   NUTRITION DIAGNOSIS:   Increased nutrient needs related to chronic illness (COPD) as evidenced by estimated needs.   GOAL:   Patient will meet greater than or equal to 90% of their needs  MONITOR:   Labs, Supplement acceptance, PO intake, Weight trends  REASON FOR ASSESSMENT:   Consult Assessment of nutrition requirement/status  ASSESSMENT:  RD working remotely.  64 year old male admitted for COPD exacerbation with past medical history of HTN and COPD presented with shortness of breath, coughing, wheezing and found to be hypoxic on room air especially requiring new 2 L oxygen requirement.  Patient is eating well, per flowsheets he consumed 100% of breakfast meal this morning. Able to speak with this very delightful patient via phone, says his breakfast was "beautiful" recalls pancakes, potatoes, and sausage. He endorses a good appetite at home, says he could eat a cow but he would settle for the pig. Patient prepares meals for himself at home, reports he rarely goes out to eat. Patient reports sometimes feeling short of breath after cooking, says he has to settle down for a bit and then he eats. RD educated on small frequent meals verses larger meals, preparing extra when cooking that he could freeze and reheat later, recommended intake of oral supplement if po intake is poor. Patient is on a heart healthy diet which restricts protein, will order strawberry Ensure BID to aid with meeting needs.  Per chart, weights stable (136-143 lb) over the past 2 years.  Medications reviewed and include: Doxycycline, Methylprednisolone IVF: NaCl @100  ml/hr  Labs: CBG 240 A1c 5.5 (WNL)   NUTRITION - FOCUSED PHYSICAL EXAM: Unable to complete at this time, RD working remotely.  Diet  Order:   Diet Order            Diet Heart Room service appropriate? Yes; Fluid consistency: Thin  Diet effective now                 EDUCATION NEEDS:   Education needs have been addressed  Skin:  Skin Assessment: Reviewed RN Assessment  Last BM:  unknown  Height:   Ht Readings from Last 1 Encounters:  11/19/19 5\' 5"  (1.651 m)    Weight:   Wt Readings from Last 1 Encounters:  11/19/19 63.5 kg    Ideal Body Weight:  61.8 kg  BMI:  Body mass index is 23.3 kg/m.  Estimated Nutritional Needs:   Kcal:  1900-2100  Protein:  95-105  Fluid:  >/= 1.9 L   Lajuan Lines, RD, LDN Clinical Nutrition After Hours/Weekend Pager # in [place]

## 2019-11-20 NOTE — Progress Notes (Signed)
PROGRESS NOTE  Brenyn Petrey VQQ:[id number] DOB: 03-14-1956 DOA: 11/19/2019 PCP: Gerlene Fee, DO  Brief History    English Craighead is a 64 y.o. male with medical history significant of hypertension and COPD who presented with shortness of breath cough and wheezing.  Patient was found to be hypoxic on room air especially with activities.  Was aggressively treated with IV Solu-Medrol as well as nebulizer in the ER.  Patient continues to have work of breathing with diffuse expiratory wheezing.  He has not completely resolved.  With mildly new oxygen requirement about 2 L patient is being admitted to the hospital for complete treatment of his COPD exacerbation.  Denied any sick contact.  No hemoptysis.  Denied any productive sputum.  Patient's chest x-ray showed no obvious infiltrates.  Patient being admitted for further work-up..  ED Course: Temperature 98.3 blood pressure 162/105 pulse 110 respirate of 30 oxygen sat 91% on room air.  CBC and chemistry all entirely within normal.  COVID-19 negative.  Chest x-ray showed no acute findings.  Patient being admitted for observation and treatment of COPD exacerbation.  Consultants  . None  Procedures  . None  Antibiotics   Anti-infectives (From admission, onward)   Start     Dose/Rate Route Frequency Ordered Stop   11/20/19 0800  doxycycline (VIBRA-TABS) tablet 100 mg     Discontinue     100 mg Oral 2 times daily 11/20/19 0112 11/25/19 0759   11/19/19 1615  doxycycline (VIBRAMYCIN) 100 mg in sodium chloride 0.9 % 250 mL IVPB        100 mg 125 mL/hr over 120 Minutes Intravenous  Once 11/19/19 1558 11/19/19 1904    .  Subjective  The patient is sitting in bed. He is tachypneic at rest. He appears quite anxious.  Objective   Vitals:  Vitals:   11/20/19 1001 11/20/19 1343  BP: (!) 149/84   Pulse: 96   Resp: 18   Temp: 98.2 F (36.8 C)   SpO2: 96% 97%   Exam:  Constitutional:  . The patient is awake, alert, and oriented x 3. No  acute distress. Respiratory:  . Positive for accessory muscle use, tachypnea, and grunting. . Diminished breath sounds bilaterally. . No wheezes, rales, or rhonchi . No tactile fremitus Cardiovascular:  . Regular rate and rhythm . No murmurs, ectopy, or gallups. . No lateral PMI. No thrills. Abdomen:  . Abdomen is soft, non-tender, non-distended . No hernias, masses, or organomegaly . Normoactive bowel sounds.  Musculoskeletal:  . No cyanosis, clubbing, or edema Skin:  . No rashes, lesions, ulcers . palpation of skin: no induration or nodules Neurologic:  . CN 2-12 intact . Sensation all 4 extremities intact Psychiatric:  . Mental status o The patient was very anxious.  o Orientation to person, place, time  . judgment and insight appear intact  I have personally reviewed the following:   Today's Data  . Vitals, CMP, CBC  Scheduled Meds: . albuterol  5 mg Nebulization Once  . amLODipine  10 mg Oral Daily  . doxycycline  100 mg Oral BID  . enoxaparin (LOVENOX) injection  40 mg Subcutaneous Q24H  . feeding supplement (ENSURE ENLIVE)  237 mL Oral BID BM  . ipratropium-albuterol  3 mL Nebulization Q6H  . losartan  100 mg Oral QHS  . methylPREDNISolone (SOLU-MEDROL) injection  80 mg Intravenous Q6H   Followed by  . [START ON 11/21/2019] predniSONE  40 mg Oral Q breakfast  . mometasone-formoterol  2 puff  Inhalation BID  . multivitamin with minerals  1 tablet Oral Daily   Continuous Infusions: . sodium chloride 100 mL/hr at 11/20/19 0127    Principal Problem:   COPD exacerbation (HCC) Active Problems:   DOE (dyspnea on exertion)   Hypertension   Tobacco abuse counseling   Anxiety state   Acute on chronic respiratory failure with hypoxemia (HCC)   LOS: 1 day   A & P  Acute hypoxic respiratory failure: He presented with Oxygen saturations in the upper 80's. However this morning he is saturating in the 90's on room air, but with severe tachypnea, accessory muscle  use, and grunting.   COPD Exacerbation: The patient is on the COPD Gold protocol. He is receiving Duoneb treatments., vibramycin, robitussin, solumedrol, and duleral. He continues to have significant respiratory distress.  Hypertension: The patient's blood pressures have been high this afternoon despite the continuation of his losartan and the addition of norvasc.  Tobacco abuse: Counseling provided.  Will offer nicotine patch.  Anxiety disorder: The patient has been started on low dose xanax.  I have seen and examined this patient myself. I have spent 34 minutes in his evaluation and care.  DVT prophylaxis: Lovenox Code Status: Full code Family Communication: No family at bedside Disposition Plan: Home  Status is: Inpatient  Remains inpatient appropriate because:Inpatient level of care appropriate due to severity of illness   Dispo: The patient is from: Home              Anticipated d/c is to: Home              Anticipated d/c date is: 2 days              Patient currently is not medically stable to d/c.  Markon Jares, DO [facility] Direct contact: see [email or web address]  7PM-7AM contact night coverage as above  11/20/2019, 5:19 PM  LOS: 1 day

## 2019-11-20 NOTE — ED Notes (Signed)
ED TO INPATIENT HANDOFF REPORT  Name/Age/Gender Gavin Kane 64 y.o. male  Code Status Code Status History    Date Active Date Inactive Code Status Order ID Comments User Context   06/20/2018 5027 06/21/2018 1316 Full Code [id number]  Phillips Grout, MD ED   10/11/2012 1732 10/13/2012 2046 Full Code [id number]  Street, Sharon [place], MD Inpatient   Advance Care Planning Activity    Questions for Most Recent Historical Code Status (Order [id number])       Home/SNF/Other Home  Chief Complaint Acute on chronic respiratory failure with hypoxemia ([place]) [J96.21]  Level of Care/Admitting Diagnosis ED Disposition    ED Disposition Condition [place]: [place] [[id number]]  Level of Care: Telemetry [5]  Admit to tele based on following criteria: Complex arrhythmia (Bradycardia/Tachycardia)  May admit patient to [facility] or [facility] if equivalent level of care is available:: Yes  Covid Evaluation: Asymptomatic Screening Protocol (No Symptoms)  Diagnosis: Acute on chronic respiratory failure with hypoxemia [facility]) [[id number]]  Admitting Physician: Elwyn Reach [[id number]]  Attending Physician: Elwyn Reach [[id number]]  Estimated length of stay: past midnight tomorrow  Certification:: I certify this patient will need inpatient services for at least 2 midnights       Medical History Past Medical History:  Diagnosis Date  . COPD (chronic obstructive pulmonary disease) ([place])   . HTN (hypertension)     Allergies No Known Allergies  IV Location/Drains/Wounds Patient Lines/Drains/Airways Status    Active Line/Drains/Airways    Name Placement date Placement time Site Days   Peripheral IV 11/19/19 Right Antecubital 11/19/19  1658  Antecubital  1          Labs/Imaging Results for orders placed or performed during the hospital encounter of 11/19/19 (from the past 48 hour(s))  CBC     Status: None   Collection Time: 11/19/19  5:00 PM  Result  Value Ref Range   WBC 8.0 4.0 - 10.5 K/uL   RBC 4.80 4.22 - 5.81 MIL/uL   Hemoglobin 13.6 13.0 - 17.0 g/dL   HCT 44.1 39 - 52 %   MCV 91.9 80.0 - 100.0 fL   MCH 28.3 26.0 - 34.0 pg   MCHC 30.8 30.0 - 36.0 g/dL   RDW 14.0 11.5 - 15.5 %   Platelets 275 150 - 400 K/uL   nRBC 0.0 0.0 - 0.2 %    Comment: Performed at [facility], [place] [address] [place], [place] [address]  Basic metabolic panel     Status: None   Collection Time: 11/19/19  5:00 PM  Result Value Ref Range   Sodium 144 135 - 145 mmol/L   Potassium 4.7 3.5 - 5.1 mmol/L   Chloride 107 98 - 111 mmol/L   CO2 29 22 - 32 mmol/L   Glucose, Bld 93 70 - 99 mg/dL    Comment: Glucose reference range applies only to samples taken after fasting for at least 8 hours.   BUN 18 8 - 23 mg/dL   Creatinine, Ser 0.90 0.61 - 1.24 mg/dL   Calcium 9.7 8.9 - 10.3 mg/dL   GFR calc non Af Amer >60 >60 mL/min   GFR calc Af Amer >60 >60 mL/min   Anion gap 8 5 - 15    Comment: Performed at [facility], [place] [address], [place], [place] [address]  SARS Coronavirus 2 by RT PCR (hospital order, performed in [facility] hospital lab) Nasopharyngeal  Nasopharyngeal Swab     Status: None   Collection Time: 11/19/19  5:05 PM   Specimen: Nasopharyngeal Swab  Result Value Ref Range   SARS Coronavirus 2 NEGATIVE NEGATIVE    Comment: (NOTE) SARS-CoV-2 target nucleic acids are NOT DETECTED.  The SARS-CoV-2 RNA is generally detectable in upper and lower respiratory specimens during the acute phase of infection. The lowest concentration of SARS-CoV-2 viral copies this assay can detect is 250 copies / mL. A negative result does not preclude SARS-CoV-2 infection and should not be used as the sole basis for treatment or other patient management decisions.  A negative result may occur with improper specimen collection / handling, submission of specimen other than nasopharyngeal swab, presence of viral mutation(s) within  the areas targeted by this assay, and inadequate number of viral copies (<250 copies / mL). A negative result must be combined with clinical observations, patient history, and epidemiological information.  Fact Sheet for Patients:   StrictlyIdeas.no  Fact Sheet for Healthcare Providers: BankingDealers.co.za  This test is not yet approved or  cleared by the [place] FDA and has been authorized for detection and/or diagnosis of SARS-CoV-2 by FDA under an Emergency Use Authorization (EUA).  This EUA will remain in effect (meaning this test can be used) for the duration of the COVID-19 declaration under Section 564(b)(1) of the Act, 21 U.S.C. section 360bbb-3(b)(1), unless the authorization is terminated or revoked sooner.  Performed at [facility], [place] [address] [place], [place] [address]    DG Chest 2 View  Result Date: 11/19/2019 CLINICAL DATA:  Shortness of breath, chest pain, and cough for 1-2 weeks. History of smoking. EXAM: CHEST - 2 VIEW COMPARISON:  09/26/2019 FINDINGS: The cardiomediastinal silhouette is unchanged with normal heart size. The lungs remain hyperinflated with mild chronic peribronchial thickening. No confluent airspace opacity, edema, pleural effusion, or pneumothorax is identified. No acute osseous abnormality is seen. IMPRESSION: COPD without evidence of acute cardiopulmonary process. Electronically Signed   By: Logan Bores M.D.   On: 11/19/2019 13:19    Pending Labs [employer] (From admission, onward) Comment          Start     Ordered   Signed and Held  HIV Antibody (routine testing w rflx)  (HIV Antibody (Routine testing w reflex) panel)  Once,   R        Signed and Held   Signed and Held  HIV Antibody (routine testing w rflx)  (HIV Antibody (Routine testing w reflex) panel)  Once,   R        Signed and Held   Signed and Held  CBC  (enoxaparin (LOVENOX)    CrCl >/= 30 ml/min)   Once,   R       Comments: Baseline for enoxaparin therapy IF NOT ALREADY DRAWN.  Notify MD if PLT < 100 K.    Signed and Held   Signed and Held  Creatinine, serum  (enoxaparin (LOVENOX)    CrCl >/= 30 ml/min)  Once,   R       Comments: Baseline for enoxaparin therapy IF NOT ALREADY DRAWN.    Signed and Held   Signed and Held  Creatinine, serum  (enoxaparin (LOVENOX)    CrCl >/= 30 ml/min)  Weekly,   R     Comments: while on enoxaparin therapy    Signed and Held   Signed and Held  Comprehensive metabolic panel  Tomorrow morning,   R  Signed and Held   Signed and Held  CBC  Tomorrow morning,   R        Signed and Held          Vitals/Pain Today's Vitals   11/19/19 2145 11/19/19 2215 11/19/19 2245 11/19/19 2329  BP: 135/82 106/76 (!) 156/89 (!) 146/84  Pulse: 84 97 89 88  Resp: 20 18  16   Temp:      TempSrc:      SpO2: 94% 96% 94% 95%  Weight:      Height:      PainSc:        Isolation Precautions No active isolations  Medications Medications  albuterol (PROVENTIL) (2.5 MG/3ML) 0.083% nebulizer solution 5 mg (5 mg Nebulization Not Given 11/19/19 1503)  methylPREDNISolone sodium succinate (SOLU-MEDROL) 125 mg/2 mL injection 125 mg (125 mg Intravenous Given 11/19/19 1658)  albuterol (VENTOLIN HFA) 108 (90 Base) MCG/ACT inhaler 8 puff (8 puffs Inhalation Given 11/19/19 1904)  ipratropium (ATROVENT HFA) inhaler 4 puff (4 puffs Inhalation Given 11/19/19 1905)  doxycycline (VIBRAMYCIN) 100 mg in sodium chloride 0.9 % 250 mL IVPB (0 mg Intravenous Stopped 11/19/19 1904)  magnesium sulfate IVPB 1 g 100 mL (0 g Intravenous Stopped 11/19/19 1845)    Mobility walks

## 2019-11-20 NOTE — Progress Notes (Signed)
OT Cancellation Note  Patient Details Name: Gavin Kane MRN: [id number] DOB: May 19, 1955   Cancelled Treatment:    Reason Eval/Treat Not Completed: Patient not medically ready Hold today per Rn. Reports patient's HR to high with ambulation. Will f/u tomorrow.  Anshu Wehner L [place] [place] 11/20/2019, 3:42 PM

## 2019-11-21 MED ORDER — ACETAMINOPHEN 325 MG PO TABS
650.0000 mg | ORAL_TABLET | Freq: Four times a day (QID) | ORAL | Status: DC | PRN
  Administered 2019-11-21 – 2019-11-23 (×3): 650 mg via ORAL
  Filled 2019-11-21 (×3): qty 2

## 2019-11-21 NOTE — Progress Notes (Signed)
   11/21/19 1439  Assess: MEWS Score  Temp 98.3 F (36.8 C)  BP (!) 158/84  Pulse Rate (!) 117  Resp (!) 22  SpO2 99 %  O2 Device Room Air  Assess: MEWS Score  MEWS Temp 0  MEWS Systolic 0  MEWS Pulse 2  MEWS RR 1  MEWS LOC 0  MEWS Score 3  MEWS Score Color Yellow  Assess: if the MEWS score is Yellow or Red  Were vital signs taken at a resting state? Yes  Focused Assessment Documented focused assessment  Early Detection of Sepsis Score *See Row Information* Low  MEWS guidelines implemented *See Row Information* Yes  Treat  MEWS Interventions Administered scheduled meds/treatments  Take Vital Signs  Increase Vital Sign Frequency  Yellow: Q 2hr X 2 then Q 4hr X 2, if remains yellow, continue Q 4hrs  Escalate  MEWS: Escalate Yellow: discuss with charge nurse/RN and consider discussing with provider and RRT  Notify: Charge Nurse/RN  Name of Charge Nurse/RN Notified Wendy, RN  Date Charge Nurse/RN Notified 11/21/19  Time Charge Nurse/RN Notified 1500  Document  Patient Outcome Stabilized after interventions  Progress note created (see row info) Yes   Pt in the Yellow MEWS d/t HR: 117, RR: 22. Pt just finished his scheduled breathing treatment and upon reassessment in a resting state the pt's HR was still elevated at 112. The pt is resting and was educated on breathing techniques and use of the flutter valve. Yellow MEWS will be initiated at this time to monitor the pt for any acute changes. Will continue to monitor closely.

## 2019-11-21 NOTE — Progress Notes (Signed)
°   11/21/19 1851  Assess: MEWS Score  Temp 98.5 F (36.9 C)  BP (!) 141/100  Pulse Rate (!) 107  Resp (!) 22  SpO2 95 %  O2 Device Room Air  Assess: MEWS Score  MEWS Temp 0  MEWS Systolic 0  MEWS Pulse 1  MEWS RR 1  MEWS LOC 0  MEWS Score 2  MEWS Score Color Yellow  Assess: if the MEWS score is Yellow or Red  Were vital signs taken at a resting state? Yes  Focused Assessment Documented focused assessment  Early Detection of Sepsis Score *See Row Information* Low  MEWS guidelines implemented *See Row Information* Yes  Treat  MEWS Interventions Other (Comment)  Take Vital Signs  Increase Vital Sign Frequency  Yellow: Q 2hr X 2 then Q 4hr X 2, if remains yellow, continue Q 4hrs  Escalate  MEWS: Escalate Yellow: discuss with charge nurse/RN and consider discussing with provider and RRT  Notify: Charge Nurse/RN  Name of Charge Nurse/RN Notified Wendy, RN  Date Charge Nurse/RN Notified 11/21/19  Time Charge Nurse/RN Notified 1855  Document  Patient Outcome Other (Comment)  Progress note created (see row info) Yes    Pt still in Yellow MEWS d/t HR: 107, RR: 22, and the pt's BP is 141/100. RN informed the night shift RN during shift change and RN will administer prn xanax and is paging MD for prn tylenol for pt c/o headache.

## 2019-11-21 NOTE — Progress Notes (Signed)
   11/21/19 0915  Assess: MEWS Score  ECG Heart Rate (!) 118  Assess: MEWS Score  MEWS Temp 0  MEWS Systolic 0  MEWS Pulse 2  MEWS RR 0  MEWS LOC 0  MEWS Score 2  MEWS Score Color Yellow    Pt is in the Yellow MEWS based on telemetry data, a full set of VS were taken while the pt was in a resting state and the pt is in [place]. Yellow MEWS will not be implemented at this time. Will continue to monitor.

## 2019-11-21 NOTE — Progress Notes (Signed)
   11/21/19 1216  Assess: MEWS Score  Pulse Rate (!) 118  O2 Device Room Air  Assess: MEWS Score  MEWS Temp 0  MEWS Systolic 0  MEWS Pulse 2  MEWS RR 0  MEWS LOC 0  MEWS Score 2  MEWS Score Color Yellow  Pt is in the Yellow MEWS based on telemetry data. The pt was bathing and moving around the room at this time. Yellow MEWS will not be implemented.

## 2019-11-21 NOTE — Progress Notes (Signed)
PROGRESS NOTE  Gavin Kane [id number] DOB: 10/26/1955 DOA: 11/19/2019 PCP: Gerlene Fee, DO  Brief History    Gavin Kane is a 64 y.o. male with medical history significant of hypertension and COPD who presented with shortness of breath cough and wheezing.  Patient was found to be hypoxic on room air especially with activities.  Was aggressively treated with IV Solu-Medrol as well as nebulizer in the ER.  Patient continues to have work of breathing with diffuse expiratory wheezing.  He has not completely resolved.  With mildly new oxygen requirement about 2 L patient is being admitted to the hospital for complete treatment of his COPD exacerbation.  Denied any sick contact.  No hemoptysis.  Denied any productive sputum.  Patient's chest x-ray showed no obvious infiltrates.  Patient being admitted for further work-up..  ED Course: Temperature 98.3 blood pressure 162/105 pulse 110 respirate of 30 oxygen sat 91% on room air.  CBC and chemistry all entirely within normal.  COVID-19 negative.  Chest x-ray showed no acute findings.  Patient being admitted for observation and treatment of COPD exacerbation.  Consultants   None  Procedures   None  Antibiotics   Anti-infectives (From admission, onward)   Start     Dose/Rate Route Frequency Ordered Stop   11/20/19 0800  doxycycline (VIBRA-TABS) tablet 100 mg     Discontinue     100 mg Oral 2 times daily 11/20/19 0112 11/25/19 0759   11/19/19 1615  doxycycline (VIBRAMYCIN) 100 mg in sodium chloride 0.9 % 250 mL IVPB        100 mg 125 mL/hr over 120 Minutes Intravenous  Once 11/19/19 1558 11/19/19 1904     Subjective  The patient is resting comfortably. Audibly wheezing. Mild conversational dyspnea upon wakening.  Objective   Vitals:  Vitals:   11/21/19 1418 11/21/19 1439  BP:  (!) 158/84  Pulse:  (!) 117  Resp:  (!) 22  Temp:  98.3 F (36.8 C)  SpO2: 93% 99%   Exam:  Constitutional:   The patient is awake, alert, and  oriented x 3. No acute distress. Respiratory:   Positive for accessory muscle use, tachypnea, and grunting.  Diminished breath sounds bilaterally.  Positive scattered wheezes  No rales, or rhonchi  No tactile fremitus Cardiovascular:   Regular rate and rhythm  No murmurs, ectopy, or gallups.  No lateral PMI. No thrills. Abdomen:   Abdomen is soft, non-tender, non-distended  No hernias, masses, or organomegaly  Normoactive bowel sounds.  Musculoskeletal:   No cyanosis, clubbing, or edema Skin:   No rashes, lesions, ulcers  palpation of skin: no induration or nodules Neurologic:   CN 2-12 intact  Sensation all 4 extremities intact Psychiatric:   Mental status o The patient is more calm. o Orientation to person, place, time   judgment and insight appear intact  I have personally reviewed the following:   Today's Data   Vitals, CMP, CBC  Scheduled Meds:  albuterol  5 mg Nebulization Once   amLODipine  10 mg Oral Daily   doxycycline  100 mg Oral BID   enoxaparin (LOVENOX) injection  40 mg Subcutaneous Q24H   feeding supplement (ENSURE ENLIVE)  237 mL Oral BID BM   ipratropium-albuterol  3 mL Nebulization Q6H   losartan  100 mg Oral QHS   mometasone-formoterol  2 puff Inhalation BID   multivitamin with minerals  1 tablet Oral Daily   predniSONE  40 mg Oral Q breakfast   Continuous Infusions:  sodium chloride 100 mL/hr at 11/20/19 0127    Principal Problem:   COPD exacerbation (HCC) Active Problems:   DOE (dyspnea on exertion)   Hypertension   Tobacco abuse counseling   Anxiety state   Acute on chronic respiratory failure with hypoxemia (HCC)   LOS: 2 days   A & P  Acute hypoxic respiratory failure: He presented with Oxygen saturations in the upper 80's. However this morning he is saturating at 99% on room air. Less distress than yesterday.  COPD Exacerbation: The patient is on the COPD Gold protocol. He is receiving Duoneb  treatments., vibramycin, robitussin, solumedrol, and duleral. Improving slowly. Will attempt to ambulate on room air in the am.  Hypertension: The patient's blood pressures have been high this afternoon despite the continuation of his losartan and the addition of norvasc.  Tobacco abuse: Counseling provided.  Will offer nicotine patch.  Anxiety disorder: The patient has been started on low dose xanax.  I have seen and examined this patient myself. I have spent 32 minutes in his evaluation and care.  DVT prophylaxis: Lovenox Code Status: Full code Family Communication: No family at bedside Disposition Plan: Home  Status is: Inpatient  Remains inpatient appropriate because:Inpatient level of care appropriate due to severity of illness   Dispo: The patient is from: Home              Anticipated d/c is to: Home              Anticipated d/c date is: 2 days              Patient currently is not medically stable to d/c.  Emilyn Ruble, DO [facility] Direct contact: see [email or web address]  7PM-7AM contact night coverage as above  11/21/2019, 2:51 PM  LOS: 1 day

## 2019-11-21 NOTE — Evaluation (Signed)
Occupational Therapy Evaluation Patient Details Name: Gavin Kane MRN: [id number] DOB: Jun 12, 1955 Today's Date: 11/21/2019    History of Present Illness Gavin Kane is a 64 y.o. male with medical history significant of hypertension and COPD who presented with shortness of breath cough and wheezing.   Clinical Impression   Mr. Gavin Kane is a 64 year old man admitted to hospital with complaints of worsening shortness of breath. On evaluation patient demonstrates normal ROM and strength of upper extremities, demonstrates ability to perform independent bed mobility, sit to stand, and donning socks. Patient reports ambulating to bathroom with nursing staff. Patient reports no physical deficits and ability to perform daily tasks. Patient reports his only "problems" is not being able to get up phlegm which fatigues him, makes him short of breath and causes him anxiety. Patient does exhibit difficulty with expectoration while therapist in room and patient encouraged to use flutter valve hourly. Patient's HR up to 127 during episode. Patient encouraged pursed lipped breathing technique after expectoration to improve recovery and dyspnea. No further needs at this time. Patient reports no therapy needs as well. OT will sign off.    Follow Up Recommendations  No OT follow up    Equipment Recommendations  None recommended by OT    Recommendations for Other Services       Precautions / Restrictions        Mobility Bed Mobility Overal bed mobility: Independent                Transfers                 General transfer comment: Mobility limited during evaluation secondary to patient's "coughing spell" in which he tries to get phlegm up - leading to fatigue, feeling short of breath, anxiety and elevated HR (HR in 120s). Sit to stand without assistance    Balance                                           ADL either performed or assessed with clinical judgement    ADL Overall ADL's : At baseline                                       General ADL Comments: Patient reports he has no difficulty performing ADLs. Demonstrates ability to donn socks, stand, reports use of urinal. Reports getting up and going to the bathroom this morning with nursing.     Vision   Vision Assessment?: No apparent visual deficits     Perception     Praxis      Pertinent Vitals/Pain Pain Assessment: No/denies pain     Hand Dominance     Extremity/Trunk Assessment Upper Extremity Assessment Upper Extremity Assessment: Overall WFL for tasks assessed (5/5 strength bilaterally)   Lower Extremity Assessment Lower Extremity Assessment: Defer to PT evaluation   Cervical / Trunk Assessment Cervical / Trunk Assessment: Normal   Communication Communication Communication: No difficulties   Cognition Arousal/Alertness: Awake/alert Behavior During Therapy: WFL for tasks assessed/performed Overall Cognitive Status: Within Functional Limits for tasks assessed                                     General Comments  Exercises     Shoulder Instructions      Home Living Family/patient expects to be discharged to:: Private residence Living Arrangements: Other (Comment) (family) Available Help at Discharge: Family Type of Home: House Home Access: Level entry     Home Layout: Two level;Other (Comment) (bedroom upstairs, half bath downstairs)     Bathroom Shower/Tub: Teacher, early years/pre: Standard     Home Equipment: None          Prior Functioning/Environment Level of Independence: Independent                 OT Problem List: Cardiopulmonary status limiting activity      OT Treatment/Interventions:      OT Goals(Current goals can be found in the care plan section) Acute Rehab OT Goals OT Goal Formulation: All assessment and education complete, DC therapy  OT Frequency:     Barriers to D/C:             Co-evaluation              AM-PAC OT "6 Clicks" Daily Activity     Outcome Measure Help from another person eating meals?: None Help from another person taking care of personal grooming?: None Help from another person toileting, which includes using toliet, bedpan, or urinal?: None Help from another person bathing (including washing, rinsing, drying)?: None Help from another person to put on and taking off regular upper body clothing?: None Help from another person to put on and taking off regular lower body clothing?: None 6 Click Score: 24   End of Session    Activity Tolerance: Other (comment) (limited by anxiety/fatigue from effort of expectoration.) Patient left:    OT Visit Diagnosis: Muscle weakness (generalized) (M62.81)                Time: 6256-3893 OT Time Calculation (min): 16 min Charges:  OT General Charges $OT Visit: 1 Visit OT Evaluation $OT Eval Low Complexity: 1 Low  Othel Dicostanzo, OTR/L [place]  Office [phone] Pager: [phone]   Lenward Chancellor 11/21/2019, 2:02 PM

## 2019-11-22 LAB — CBC
HCT: 36.4 % — ABNORMAL LOW (ref 39.0–52.0)
Hemoglobin: 11.5 g/dL — ABNORMAL LOW (ref 13.0–17.0)
MCH: 28.5 pg (ref 26.0–34.0)
MCHC: 31.6 g/dL (ref 30.0–36.0)
MCV: 90.3 fL (ref 80.0–100.0)
Platelets: 252 10*3/uL (ref 150–400)
RBC: 4.03 MIL/uL — ABNORMAL LOW (ref 4.22–5.81)
RDW: 13.8 % (ref 11.5–15.5)
WBC: 15.1 10*3/uL — ABNORMAL HIGH (ref 4.0–10.5)
nRBC: 0 % (ref 0.0–0.2)

## 2019-11-22 LAB — BASIC METABOLIC PANEL
Anion gap: 7 (ref 5–15)
BUN: 14 mg/dL (ref 8–23)
CO2: 26 mmol/L (ref 22–32)
Calcium: 9 mg/dL (ref 8.9–10.3)
Chloride: 107 mmol/L (ref 98–111)
Creatinine, Ser: 0.69 mg/dL (ref 0.61–1.24)
GFR calc Af Amer: >60 mL/min (ref >60)
GFR calc non Af Amer: >60 mL/min (ref >60)
Glucose, Bld: 129 mg/dL — ABNORMAL HIGH (ref 70–99)
Potassium: 3.7 mmol/L (ref 3.5–5.1)
Sodium: 140 mmol/L (ref 135–145)

## 2019-11-22 MED ORDER — GUAIFENESIN ER 600 MG PO TB12
1200.0000 mg | ORAL_TABLET | Freq: Two times a day (BID) | ORAL | Status: DC
  Administered 2019-11-22 – 2019-11-24 (×5): 1200 mg via ORAL
  Filled 2019-11-22 (×5): qty 2

## 2019-11-22 NOTE — Progress Notes (Addendum)
This note also relates to the following rows which could not be included: ECG Heart Rate - Cannot attach notes to unvalidated device data.    Vitals retaken at rest and WNL in green MEWS.  Will continue to monitor.

## 2019-11-22 NOTE — Progress Notes (Signed)
Ambulating O2 sat obtained - 84% on room air.  Patient's heart rate while ambulating was 122

## 2019-11-22 NOTE — Progress Notes (Signed)
PROGRESS NOTE  Gavin Kane NFA:[id number] DOB: 03-Jun-1955 DOA: 11/19/2019 PCP: Gerlene Fee, DO  Brief History    Gavin Kane is a 64 y.o. male with medical history significant of hypertension and COPD who presented with shortness of breath cough and wheezing.  Patient was found to be hypoxic on room air especially with activities.  Was aggressively treated with IV Solu-Medrol as well as nebulizer in the ER.  Patient continues to have work of breathing with diffuse expiratory wheezing.  He has not completely resolved.  With mildly new oxygen requirement about 2 L patient is being admitted to the hospital for complete treatment of his COPD exacerbation.  Denied any sick contact.  No hemoptysis.  Denied any productive sputum.  Patient's chest x-ray showed no obvious infiltrates.  Patient being admitted for further work-up..  ED Course: Temperature 98.3 blood pressure 162/105 pulse 110 respirate of 30 oxygen sat 91% on room air.  CBC and chemistry all entirely within normal.  COVID-19 negative.  Chest x-ray showed no acute findings.  Patient being admitted for observation and treatment of COPD exacerbation.  Ambulatory O2 sat was obtained today. His saturations drop to 82% on room air with ambulation.  Consultants  . None  Procedures  . None  Antibiotics   Anti-infectives (From admission, onward)   Start     Dose/Rate Route Frequency Ordered Stop   11/20/19 0800  doxycycline (VIBRA-TABS) tablet 100 mg     Discontinue     100 mg Oral 2 times daily 11/20/19 0112 11/25/19 0759   11/19/19 1615  doxycycline (VIBRAMYCIN) 100 mg in sodium chloride 0.9 % 250 mL IVPB        100 mg 125 mL/hr over 120 Minutes Intravenous  Once 11/19/19 1558 11/19/19 1904     Subjective  The patient is resting comfortably. No new complaints.  Objective   Vitals:  Vitals:   11/22/19 1352 11/22/19 1800  BP: 132/84   Pulse: (!) 101   Resp: 20   Temp: 98.2 F (36.8 C)   SpO2: 94% 98%    Exam:  Constitutional:  . The patient is awake, alert, and oriented x 3. No acute distress. Respiratory:  . Positive for accessory muscle use, tachypnea, and grunting. . Diminished breath sounds bilaterally. Marland Kitchen Positive scattered wheezes . No rales, or rhonchi . No tactile fremitus Cardiovascular:  . Regular rate and rhythm . No murmurs, ectopy, or gallups. . No lateral PMI. No thrills. Abdomen:  . Abdomen is soft, non-tender, non-distended . No hernias, masses, or organomegaly . Normoactive bowel sounds.  Musculoskeletal:  . No cyanosis, clubbing, or edema Skin:  . No rashes, lesions, ulcers . palpation of skin: no induration or nodules Neurologic:  . CN 2-12 intact . Sensation all 4 extremities intact Psychiatric:  . Mental status o The patient is more calm. o Orientation to person, place, time  . judgment and insight appear intact  I have personally reviewed the following:   Today's Data  . Vitals, CMP, CBC  Scheduled Meds: . albuterol  5 mg Nebulization Once  . amLODipine  10 mg Oral Daily  . doxycycline  100 mg Oral BID  . enoxaparin (LOVENOX) injection  40 mg Subcutaneous Q24H  . feeding supplement (ENSURE ENLIVE)  237 mL Oral BID BM  . guaiFENesin  1,200 mg Oral BID  . ipratropium-albuterol  3 mL Nebulization Q6H  . losartan  100 mg Oral QHS  . mometasone-formoterol  2 puff Inhalation BID  . multivitamin with minerals  1 tablet Oral Daily  . predniSONE  40 mg Oral Q breakfast   Continuous Infusions: . sodium chloride 100 mL/hr at 11/22/19 0600    Principal Problem:   COPD exacerbation (HCC) Active Problems:   DOE (dyspnea on exertion)   Hypertension   Tobacco abuse counseling   Anxiety state   Acute on chronic respiratory failure with hypoxemia (HCC)   LOS: 3 days   A & P  Acute hypoxic respiratory failure: He presented with Oxygen saturations in the upper 80's. However this morning he is saturating at 99% on room air. Less distress than  yesterday. However ambulatory SaO2 on room air was 82%.  COPD Exacerbation: The patient is on the COPD Gold protocol. He is receiving Duoneb treatments., vibramycin, robitussin, solumedrol, and duleral. Improving slowly.  However ambulatory SaO2 on room air was 82%.  Hypertension: The patient's blood pressures have been high this afternoon despite the continuation of his losartan and the addition of norvasc.  Tobacco abuse: Counseling provided.  Will offer nicotine patch.  Anxiety disorder: The patient has been started on low dose xanax.  I have seen and examined this patient myself. I have spent 30 minutes in his evaluation and care.  DVT prophylaxis: Lovenox Code Status: Full code Family Communication: No family at bedside Disposition Plan: Home  Status is: Inpatient  Remains inpatient appropriate because:Inpatient level of care appropriate due to severity of illness   Dispo: The patient is from: Home              Anticipated d/c is to: Home              Anticipated d/c date is: 2 days              Patient currently is not medically stable to d/c.  Lutricia Widjaja, DO [facility] Direct contact: see [email or web address]  7PM-7AM contact night coverage as above  11/22/2019, 6:37 PM  LOS: 1 day

## 2019-11-22 NOTE — Progress Notes (Signed)
   11/22/19 0250  Assess: MEWS Score  Temp 98.1 F (36.7 C)  BP (!) 143/88  Pulse Rate (!) 101  Resp (!) 24  SpO2 97 %  O2 Device Room Air  Assess: MEWS Score  MEWS Temp 0  MEWS Systolic 0  MEWS Pulse 1  MEWS RR 1  MEWS LOC 0  MEWS Score 2  MEWS Score Color Yellow  Assess: if the MEWS score is Yellow or Red  Were vital signs taken at a resting state? No  Focused Assessment Documented focused assessment  Early Detection of Sepsis Score *See Row Information* Low  MEWS guidelines implemented *See Row Information* No, vital signs rechecked

## 2019-11-22 NOTE — Evaluation (Signed)
Physical Therapy Evaluation Patient Details Name: Gavin Kane MRN: [id number] DOB: December 20, 1955 Today's Date: 11/22/2019   History of Present Illness  Pt is 64 yo male with PMH of COPD and HTN.   He presented to the ED with cough and wheezing.  Pt was admitted with COPD exacerbation.  Clinical Impression  Pt admitted with COPD exacerbation.  He was able to ambulate on RA with sats at 90% and DOE of 3/4.  He demonstrated normal strength and mobility with safe gait and balance.  Other than some DOE , pt is at baseline mobility.  Encouraged ambulation with nursing staff, but no further skilled PT indicated.     Follow Up Recommendations No PT follow up    Equipment Recommendations  None recommended by PT    Recommendations for Other Services       Precautions / Restrictions Precautions Precautions: None      Mobility  Bed Mobility Overal bed mobility: Independent                Transfers Overall transfer level: Needs assistance Equipment used: None Transfers: Sit to/from Stand           General transfer comment: supervision  Ambulation/Gait Ambulation/Gait assistance: Supervision Gait Distance (Feet): 400 Feet Assistive device: None Gait Pattern/deviations: WFL(Within Functional Limits) Gait velocity: mild decrease   General Gait Details: normal reciprocal gait without LOB  Stairs            Wheelchair Mobility    Modified Rankin (Stroke Patients Only)       Balance Overall balance assessment: Independent                                           Pertinent Vitals/Pain Pain Assessment: No/denies pain    Home Living Family/patient expects to be discharged to:: Private residence Living Arrangements: Children Available Help at Discharge: Family Type of Home: House Home Access: Level entry     Home Layout: Two level;Able to live on main level with bedroom/bathroom;1/2 bath on main level Home Equipment: None      Prior  Function Level of Independence: Independent               Hand Dominance        Extremity/Trunk Assessment   Upper Extremity Assessment Upper Extremity Assessment: Overall WFL for tasks assessed    Lower Extremity Assessment Lower Extremity Assessment: Overall WFL for tasks assessed    Cervical / Trunk Assessment Cervical / Trunk Assessment: Normal  Communication   Communication: No difficulties  Cognition Arousal/Alertness: Awake/alert Behavior During Therapy: WFL for tasks assessed/performed Overall Cognitive Status: Within Functional Limits for tasks assessed                                        General Comments General comments (skin integrity, edema, etc.): Pt was on RA with sats 93%.  Ambulated on RA with sats 90% with walking and some talking.    Exercises     Assessment/Plan    PT Assessment Patent does not need any further PT services  PT Problem List         PT Treatment Interventions      PT Goals (Current goals can be found in the Care Plan section)  Acute Rehab PT Goals Patient  Stated Goal: return home; loosen up congestion PT Goal Formulation: All assessment and education complete, DC therapy Potential to Achieve Goals: Good    Frequency     Barriers to discharge        Co-evaluation               AM-PAC PT "6 Clicks" Mobility  Outcome Measure Help needed turning from your back to your side while in a flat bed without using bedrails?: None Help needed moving from lying on your back to sitting on the side of a flat bed without using bedrails?: None Help needed moving to and from a bed to a chair (including a wheelchair)?: None Help needed standing up from a chair using your arms (e.g., wheelchair or bedside chair)?: None Help needed to walk in hospital room?: None Help needed climbing 3-5 steps with a railing? : None 6 Click Score: 24    End of Session   Activity Tolerance: Patient tolerated treatment  well Patient left: in bed;with call bell/phone within reach Nurse Communication: Mobility status      Time: 1440-1500 PT Time Calculation (min) (ACUTE ONLY): 20 min   Charges:   PT Evaluation $PT Eval Low Complexity: 1 Low          Altha Sweitzer, PT Acute Rehab Services Pager [phone] [facility] [phone]    Karlton Lemon 11/22/2019, 3:07 PM

## 2019-11-22 NOTE — Progress Notes (Signed)
This note also relates to the following rows which could not be included: ECG Heart Rate - Cannot attach notes to unvalidated device data    11/22/19 0300  Assess: MEWS Score  Temp 98.1 F (36.7 C)  BP 124/79  Pulse Rate 94  Resp 20  Level of Consciousness Alert  SpO2 95 %  O2 Device Room Air  Patient Activity (if Appropriate) In bed  O2 Flow Rate (L/min) 0 L/min  Assess: MEWS Score  MEWS Temp 0  MEWS Systolic 0  MEWS Pulse 0  MEWS RR 0  MEWS LOC 0  MEWS Score 0  MEWS Score Color Green  Assess: if the MEWS score is Yellow or Red  Were vital signs taken at a resting state? Yes  Focused Assessment Documented focused assessment  Early Detection of Sepsis Score *See Row Information* Low  MEWS guidelines implemented *See Row Information* No, vital signs rechecked

## 2019-11-22 NOTE — Care Management Important Message (Signed)
Important Message  Patient Details IM Letter given to Roque Lias SW Case Manager to present to the Patient Name: Gavin Kane MRN: [id number] Date of Birth: 1956-03-08   Medicare Important Message Given:  Yes     [name] 11/22/2019, 11:00 AM

## 2019-11-23 NOTE — Progress Notes (Signed)
PROGRESS NOTE  Jaymari Cromie YSA:[id number] DOB: [birth year] DOA: 11/19/2019 PCP: Gerlene Fee, DO  Brief History    Matej Sappenfield is a 64 y.o. male with medical history significant of hypertension and COPD who presented with shortness of breath cough and wheezing.  Patient was found to be hypoxic on room air especially with activities.  Was aggressively treated with IV Solu-Medrol as well as nebulizer in the ER.  Patient continues to have work of breathing with diffuse expiratory wheezing.  He has not completely resolved.  With mildly new oxygen requirement about 2 L patient is being admitted to the hospital for complete treatment of his COPD exacerbation.  Denied any sick contact.  No hemoptysis.  Denied any productive sputum.  Patient's chest x-ray showed no obvious infiltrates.  Patient being admitted for further work-up..  ED Course: Temperature 98.3 blood pressure 162/105 pulse 110 respirate of 30 oxygen sat 91% on room air.  CBC and chemistry all entirely within normal.  COVID-19 negative.  Chest x-ray showed no acute findings.  Patient being admitted for observation and treatment of COPD exacerbation.  Ambulatory O2 sat was obtained on 11/22/2019. His saturations drop to 82% on room air with ambulation. Will recheck in am.  Consultants  . None  Procedures  . None  Antibiotics   Anti-infectives (From admission, onward)   Start     Dose/Rate Route Frequency Ordered Stop   11/20/19 0800  doxycycline (VIBRA-TABS) tablet 100 mg     Discontinue     100 mg Oral 2 times daily 11/20/19 0112 11/25/19 0759   11/19/19 1615  doxycycline (VIBRAMYCIN) 100 mg in sodium chloride 0.9 % 250 mL IVPB        100 mg 125 mL/hr over 120 Minutes Intravenous  Once 11/19/19 1558 11/19/19 1904     Subjective  The patient is resting comfortably. No new complaints.  Objective   Vitals:  Vitals:   11/23/19 0938 11/23/19 1408  BP: 137/81 119/79  Pulse: 98 100  Resp:  18  Temp: 98.1 F (36.7 C) 99  F (37.2 C)  SpO2: 94% 100%   Exam:  Constitutional:  . The patient is awake, alert, and oriented x 3. Still grunting with moving about in bed. Respiratory:  . Positive for accessory muscle use and grunting. . Diminished breath sounds bilaterally. Marland Kitchen Positive scattered wheezes . No rales, or rhonchi . No tactile fremitus Cardiovascular:  . Regular rate and rhythm . No murmurs, ectopy, or gallups. . No lateral PMI. No thrills. Abdomen:  . Abdomen is soft, non-tender, non-distended . No hernias, masses, or organomegaly . Normoactive bowel sounds.  Musculoskeletal:  . No cyanosis, clubbing, or edema Skin:  . No rashes, lesions, ulcers . palpation of skin: no induration or nodules Neurologic:  . CN 2-12 intact . Sensation all 4 extremities intact Psychiatric:  . Mental status o The patient is more calm. o Orientation to person, place, time  . judgment and insight appear intact  I have personally reviewed the following:   Today's Data  . Vitals, CMP, CBC  Scheduled Meds: . albuterol  5 mg Nebulization Once  . amLODipine  10 mg Oral Daily  . doxycycline  100 mg Oral BID  . enoxaparin (LOVENOX) injection  40 mg Subcutaneous Q24H  . feeding supplement (ENSURE ENLIVE)  237 mL Oral BID BM  . guaiFENesin  1,200 mg Oral BID  . ipratropium-albuterol  3 mL Nebulization Q6H  . losartan  100 mg Oral QHS  . mometasone-formoterol  2 puff Inhalation BID  . multivitamin with minerals  1 tablet Oral Daily  . predniSONE  40 mg Oral Q breakfast   Continuous Infusions: . sodium chloride 100 mL/hr at 11/23/19 3383    Principal Problem:   COPD exacerbation (HCC) Active Problems:   DOE (dyspnea on exertion)   Hypertension   Tobacco abuse counseling   Anxiety state   Acute on chronic respiratory failure with hypoxemia (HCC)   LOS: 4 days   A & P  Acute hypoxic respiratory failure: He presented with Oxygen saturations in the upper 80's. However this morning he is saturating at  100% on room air. Less distress than yesterday. However ambulatory SaO2 on room air was 82% on 11/22/2019. Have asked for another ambulatory Sao2. He continues to have severe grunting with moving about in bed. Otherwise appears more comfortable.  COPD Exacerbation: The patient is on the COPD Gold protocol. He is receiving Duoneb treatments., vibramycin, robitussin, solumedrol, and duleral. Improving slowly.    Hypertension: The patient's blood pressures have been high this afternoon despite the continuation of his losartan and the addition of norvasc.  Tobacco abuse: Counseling provided.  Will offer nicotine patch.  Anxiety disorder: The patient has been started on low dose xanax.  I have seen and examined this patient myself. I have spent 32 minutes in his evaluation and care.  DVT prophylaxis: Lovenox Code Status: Full code Family Communication: No family at bedside Disposition Plan: Home  Status is: Inpatient  Remains inpatient appropriate because:Inpatient level of care appropriate due to severity of illness   Dispo: The patient is from: Home              Anticipated d/c is to: Home              Anticipated d/c date is: 2 days              Patient currently is not medically stable to d/c.  Amalya Salmons, DO [facility] Direct contact: see [email or web address]  7PM-7AM contact night coverage as above  11/23/2019, 5:15 PM  LOS: 1 day

## 2019-11-24 DIAGNOSIS — J441 Chronic obstructive pulmonary disease with (acute) exacerbation: Principal | ICD-10-CM

## 2019-11-24 LAB — BASIC METABOLIC PANEL
Anion gap: 7 (ref 5–15)
BUN: 18 mg/dL (ref 8–23)
CO2: 29 mmol/L (ref 22–32)
Calcium: 9.1 mg/dL (ref 8.9–10.3)
Chloride: 103 mmol/L (ref 98–111)
Creatinine, Ser: 0.63 mg/dL (ref 0.61–1.24)
GFR calc Af Amer: >60 mL/min (ref >60)
GFR calc non Af Amer: >60 mL/min (ref >60)
Glucose, Bld: 92 mg/dL (ref 70–99)
Potassium: 3.8 mmol/L (ref 3.5–5.1)
Sodium: 139 mmol/L (ref 135–145)

## 2019-11-24 LAB — CBC
HCT: 36.3 % — ABNORMAL LOW (ref 39.0–52.0)
Hemoglobin: 11.7 g/dL — ABNORMAL LOW (ref 13.0–17.0)
MCH: 28.7 pg (ref 26.0–34.0)
MCHC: 32.2 g/dL (ref 30.0–36.0)
MCV: 89.2 fL (ref 80.0–100.0)
Platelets: 282 10*3/uL (ref 150–400)
RBC: 4.07 MIL/uL — ABNORMAL LOW (ref 4.22–5.81)
RDW: 13.8 % (ref 11.5–15.5)
WBC: 13 10*3/uL — ABNORMAL HIGH (ref 4.0–10.5)
nRBC: 0 % (ref 0.0–0.2)

## 2019-11-24 MED ORDER — AMLODIPINE BESYLATE 5 MG PO TABS: 5.0000 mg | ORAL_TABLET | Freq: Every day | ORAL | 0 refills | Status: DC

## 2019-11-24 MED ORDER — PREDNISONE 20 MG PO TABS: 40.0000 mg | ORAL_TABLET | Freq: Every day | ORAL | Status: DC

## 2019-11-24 MED ORDER — PREDNISONE 20 MG PO TABS
ORAL_TABLET | ORAL | 0 refills | Status: DC
Start: 2019-11-24 — End: 2020-02-02

## 2019-11-24 MED ORDER — ALPRAZOLAM 0.25 MG PO TABS: 0.2500 mg | ORAL_TABLET | Freq: Two times a day (BID) | ORAL | 0 refills | Status: DC | PRN

## 2019-11-24 MED ORDER — ALBUTEROL SULFATE (2.5 MG/3ML) 0.083% IN NEBU
2.5000 mg | INHALATION_SOLUTION | RESPIRATORY_TRACT | 2 refills | Status: DC | PRN
Start: 2019-11-24 — End: 2019-12-09

## 2019-11-24 NOTE — Progress Notes (Signed)
Discharge instructions, follow up visit, and medications discussed with court appointed guardian Gar Ponto via phone. She verbalizes understanding that xanax is only ordered short term until pt can see his PCP and to follow directions of prednisone label as dosage differs daily. She's was also made aware that pt/family/guardian needs to call for follow up appointment.

## 2019-11-24 NOTE — Discharge Summary (Signed)
[facility]  Physician Discharge Summary   Patient ID: Gavin Kane MRN: [id number] DOB/AGE: Dec 26, 1955 64 y.o.  Admit date: 11/19/2019 Discharge date: 11/24/2019  PCP: Gerlene Fee, DO  DISCHARGE DIAGNOSES:  Acute respiratory failure with hypoxia, resolved Acute COPD exacerbation, improved Essential hypertension Anxiety state Tobacco abuse  RECOMMENDATIONS FOR OUTPATIENT FOLLOW UP: 1. Follow-up with PCP for further management of anxiety    Home Health: None Equipment/Devices: None  CODE STATUS: Full code  DISCHARGE CONDITION: fair  Diet recommendation: As before  INITIAL HISTORY: Gavin Kane a 64 y.o.malewith medical history significant ofhypertension and COPD who presented with shortness of breath cough and wheezing. Patient was found to be hypoxic on room air especially with activities. Was aggressively treated with IV Solu-Medrol as well as nebulizer in the ER. Patient continues to have work of breathing with diffuse expiratory wheezing. He has not completely resolved. With mildly new oxygen requirement about 2 L patient is being admitted to the hospital for complete treatment of his COPD exacerbation. Denied any sick contact. No hemoptysis. Denied any productive sputum. Patient's chest x-ray showed no obvious infiltrates. Patient being admitted for further work-up..  ED Course:Temperature 98.3 blood pressure 162/105 pulse 110 respirate of 30 oxygen sat 91% on room air. CBC and chemistry all entirely within normal. COVID-19 negative. Chest x-ray showed no acute findings. Patient being admitted for observation and treatment of COPD exacerbation.   HOSPITAL COURSE:   Acute hypoxic respiratory failure He presented with Oxygen saturations in the upper 80's.  Patient was given treatment for COPD exacerbation.  He was weaned off of oxygen.  Ambulated this morning and is saturating in the early 90s.  Will not need home oxygen at this time.    Acute COPD Exacerbation Started on treatment with systemic steroids, nebulized bronchodilators and antibacterials.  Slowly started improving.  Will be discharged on tapering doses of steroids.  Essential hypertension Noted to be on losartan.  Amlodipine was added to his regimen due to poorly controlled blood pressure.   Tobacco abuse Counseling provided.   Anxiety State Was given alprazolam here in the hospital.  Will be prescribed a few tablets to go home with.  He was told to discuss his anxiety with his primary care provider as he may benefit from being on a long-acting medication.  Patient ambulated this morning.  Saturations stayed in the early 90s.  Okay for discharge home today.    PERTINENT LABS:  The results of significant diagnostics from this hospitalization (including imaging, microbiology, ancillary and laboratory) are listed below for reference.    Microbiology: Recent Results (from the past 240 hour(s))  SARS Coronavirus 2 by RT PCR (hospital order, performed in [facility] hospital lab) Nasopharyngeal Nasopharyngeal Swab     Status: None   Collection Time: 11/19/19  5:05 PM   Specimen: Nasopharyngeal Swab  Result Value Ref Range Status   SARS Coronavirus 2 NEGATIVE NEGATIVE Final    Comment: (NOTE) SARS-CoV-2 target nucleic acids are NOT DETECTED.  The SARS-CoV-2 RNA is generally detectable in upper and lower respiratory specimens during the acute phase of infection. The lowest concentration of SARS-CoV-2 viral copies this assay can detect is 250 copies / mL. A negative result does not preclude SARS-CoV-2 infection and should not be used as the sole basis for treatment or other patient management decisions.  A negative result may occur with improper specimen collection / handling, submission of specimen other than nasopharyngeal swab, presence of viral mutation(s) within the areas targeted by this assay,  and inadequate number of viral copies (<250  copies / mL). A negative result must be combined with clinical observations, patient history, and epidemiological information.  Fact Sheet for Patients:   StrictlyIdeas.no  Fact Sheet for Healthcare Providers: BankingDealers.co.za  This test is not yet approved or  cleared by the [place] FDA and has been authorized for detection and/or diagnosis of SARS-CoV-2 by FDA under an Emergency Use Authorization (EUA).  This EUA will remain in effect (meaning this test can be used) for the duration of the COVID-19 declaration under Section 564(b)(1) of the Act, 21 U.S.C. section 360bbb-3(b)(1), unless the authorization is terminated or revoked sooner.  Performed at [facility], [place] [address] [place], [place] [address]      Labs:  COVID-19 Labs   Lab Results  Component Value Date   [place] 11/19/2019   [place] Not Detected 09/22/2019   SARSCOV2NAA Detected (A) 05/22/2019   [place] Not Detected 05/17/2019      Basic Metabolic Panel: Recent Labs  Lab 11/19/19 1700 11/20/19 0401 11/22/19 0441 11/24/19 0333  NA 144 138 140 139  K 4.7 4.7 3.7 3.8  CL 107 106 107 103  CO2 29 25 26 29   GLUCOSE 93 163* 129* 92  BUN 18 16 14 18   CREATININE 0.90 0.70 0.69 0.63  CALCIUM 9.7 9.3 9.0 9.1   Liver Function Tests: Recent Labs  Lab 11/20/19 0401  AST 18  ALT 17  ALKPHOS 51  BILITOT 0.6  PROT 6.5  ALBUMIN 3.7   CBC: Recent Labs  Lab 11/19/19 1700 11/20/19 0401 11/22/19 0441 11/24/19 0333  WBC 8.0 6.4 15.1* 13.0*  HGB 13.6 11.8* 11.5* 11.7*  HCT 44.1 37.5* 36.4* 36.3*  MCV 91.9 90.4 90.3 89.2  PLT 275 241 252 282    CBG: Recent Labs  Lab 11/20/19 0116 11/20/19 1215  GLUCAP 240* 308*     IMAGING STUDIES DG Chest 2 View  Result Date: 11/19/2019 CLINICAL DATA:  Shortness of breath, chest pain, and cough for 1-2 weeks. History of smoking. EXAM: CHEST - 2 VIEW  COMPARISON:  09/26/2019 FINDINGS: The cardiomediastinal silhouette is unchanged with normal heart size. The lungs remain hyperinflated with mild chronic peribronchial thickening. No confluent airspace opacity, edema, pleural effusion, or pneumothorax is identified. No acute osseous abnormality is seen. IMPRESSION: COPD without evidence of acute cardiopulmonary process. Electronically Signed   By: Logan Bores M.D.   On: 11/19/2019 13:19    DISCHARGE EXAMINATION: Vitals:   11/24/19 0306 11/24/19 0441 11/24/19 0745 11/24/19 1403  BP:  132/79    Pulse:  84    Resp:  19    Temp:  98.2 F (36.8 C)    TempSrc:      SpO2: 96% 93% 98% 96%  Weight:      Height:       General appearance: Awake alert.  In no distress Resp: Normal effort.  Few scattered wheezes.  No rhonchi.   Cardio: S1-S2 is normal regular.  No S3-S4.  No rubs murmurs or bruit GI: Abdomen is soft.  Nontender nondistended.  Bowel sounds are present normal.  No masses organomegaly    DISPOSITION: Home  Discharge Instructions    Call MD for:  difficulty breathing, headache or visual disturbances   Complete by: As directed    Call MD for:  extreme fatigue   Complete by: As directed    Call MD for:  persistant dizziness or light-headedness   Complete by: As directed  Call MD for:  persistant nausea and vomiting   Complete by: As directed    Call MD for:  severe uncontrolled pain   Complete by: As directed    Call MD for:  temperature >100.4   Complete by: As directed    Discharge instructions   Complete by: As directed    Please be sure to follow-up with your PCP.  Discussed antianxiety medications with your primary care provider.  You were cared for by a hospitalist during your hospital stay. If you have any questions about your discharge medications or the care you received while you were in the hospital after you are discharged, you can call the unit and asked to speak with the hospitalist on call if the hospitalist  that took care of you is not available. Once you are discharged, your primary care physician will handle any further medical issues. Please note that NO REFILLS for any discharge medications will be authorized once you are discharged, as it is imperative that you return to your primary care physician (or establish a relationship with a primary care physician if you do not have one) for your aftercare needs so that they can reassess your need for medications and monitor your lab values. If you do not have a primary care physician, you can call [phone] for a physician referral.   Increase activity slowly   Complete by: As directed         Allergies as of 11/24/2019   No Known Allergies     Medication List    STOP taking these medications   benzonatate 100 MG capsule Commonly known as: TESSALON   cetirizine 10 MG tablet Commonly known as: ZyrTEC Allergy   doxycycline 100 MG capsule Commonly known as: VIBRAMYCIN     TAKE these medications   albuterol 108 (90 Base) MCG/ACT inhaler Commonly known as: VENTOLIN HFA Inhale 2 puffs into the lungs every 4 (four) hours as needed for wheezing or shortness of breath. SHORTNESS OF BREATH OR COUGH What changed: Another medication with the same name was added. Make sure you understand how and when to take each.   albuterol (2.5 MG/3ML) 0.083% nebulizer solution Commonly known as: PROVENTIL Take 3 mLs (2.5 mg total) by nebulization every 4 (four) hours as needed for wheezing or shortness of breath. What changed: You were already taking a medication with the same name, and this prescription was added. Make sure you understand how and when to take each.   ALPRAZolam 0.25 MG tablet Commonly known as: XANAX Take 1 tablet (0.25 mg total) by mouth 2 (two) times daily as needed for anxiety.   amLODipine 5 MG tablet Commonly known as: NORVASC Take 1 tablet (5 mg total) by mouth daily. Start taking on: November 25, 2019   [facility] 200-5 MCG/ACT  Aero Generic drug: mometasone-formoterol Inhale 2 puffs into the lungs 2 (two) times daily.   losartan 100 MG tablet Commonly known as: COZAAR Take 1 tablet (100 mg total) by mouth at bedtime.   predniSONE 20 MG tablet Commonly known as: DELTASONE Take 3 tablets once daily for 3 days followed by 2 tablets once daily for 3 days followed by 1 tablet once daily for 3 days and then stop   Spiriva HandiHaler 18 MCG inhalation capsule Generic drug: tiotropium Use on capsule with 2 oral inhalations once per day What changed:   how much to take  how to take this  when to take this  Follow-up Information    Autry-Lott, Naaman Plummer, DO. Schedule an appointment as soon as possible for a visit in 1 week(s).   Specialty: Family Medicine Contact information: [address] [address] [phone]               TOTAL DISCHARGE TIME: 4 minutes  [place]  [facility] Pager on [email or web address]  11/24/2019, 2:08 PM

## 2019-11-24 NOTE — Discharge Instructions (Signed)

## 2019-11-24 NOTE — Progress Notes (Signed)
Discharge instructions, follow up visits, and medications reviewed with pt who verbalizes understanding.

## 2019-11-30 ENCOUNTER — Ambulatory Visit (INDEPENDENT_AMBULATORY_CARE_PROVIDER_SITE_OTHER): Payer: Medicare Other | Admitting: Family Medicine

## 2019-11-30 ENCOUNTER — Encounter: Payer: Self-pay | Admitting: Family Medicine

## 2019-11-30 ENCOUNTER — Other Ambulatory Visit: Payer: Self-pay

## 2019-11-30 VITALS — BP 110/60 | HR 96 | Wt 145.0 lb

## 2019-11-30 DIAGNOSIS — Z0000 Encounter for general adult medical examination without abnormal findings: Secondary | ICD-10-CM

## 2019-11-30 DIAGNOSIS — J432 Centrilobular emphysema: Secondary | ICD-10-CM

## 2019-11-30 DIAGNOSIS — Z23 Encounter for immunization: Secondary | ICD-10-CM

## 2019-11-30 DIAGNOSIS — F411 Generalized anxiety disorder: Secondary | ICD-10-CM

## 2019-11-30 DIAGNOSIS — I10 Essential (primary) hypertension: Secondary | ICD-10-CM | POA: Diagnosis not present

## 2019-11-30 MED ORDER — ALPRAZOLAM 0.25 MG PO TABS: 0.2500 mg | ORAL_TABLET | Freq: Two times a day (BID) | ORAL | 0 refills | Status: DC | PRN

## 2019-11-30 NOTE — Patient Instructions (Signed)
It was very nice to meet you today. Please enjoy the rest of your week  Continue amlodipine for your blood pressure.   You can continue xanax for now for panic attacks. We need to assess you for generalized anxiety.   Continue steroid taper. 1 tab until 7/29. Take your dulera and spiriva daily. Highly consider stopping smoking all together.   You were referred to the gastroenterologist for a colonoscopy. Please let us know if you are not called in 1 week to schedule this procedure.    Follow up in 1 week for a BP check and follow up visit or sooner if needed.   Please call the clinic at [phone] if your symptoms worsen or you have any concerns. It was our pleasure to serve you.

## 2019-11-30 NOTE — Progress Notes (Signed)
    SUBJECTIVE:   CHIEF COMPLAINT / HPI:   Hypertension: - Medications: Amlodipine 5mg  daily  - Compliance: Endorses compliance with amlodipine. Recently discontinued losartan during recent hospital admission has not taken in 1 week.  - Denies any SOB, CP, vision changes, LE edema, medication SEs, or symptoms of hypotension  H/o COPD exacerbation Admitted 11/19/2019, discharged 11/24/2019.  Continued on steroid taper now down to 1 tab daily until 12/02/2019.  Endorses the knowledge of continuing daily controller inhalers.  Voices understanding that smoking cessation will be greatly beneficial to lung health.  Continues to endorse wet cough.  Denies shortness of breath.  Anxiety/Panic Attacks Was having panic attacks and did not realize what they were was treated with Prozac.  Takes Xanax daily.  Does not want to switch to a different medication as of right now.  Voices understanding that this is not a great long-term medication.  Will attempt to only take when he feels symptoms of a panic attack.  PERTINENT  PMH / PSH: History of benzodiazepine abuse (prior provider attempted to wean several years ago instead patient was taking 5-6 benzos at a time while being currently prescribed half a tab twice daily).  OBJECTIVE:   BP (!) 110/60   Pulse 96   Wt 145 lb (65.8 kg)   SpO2 99%   BMI 24.13 kg/m   General: Appears anxious. No acute distress. Age appropriate. Cardiac: Increase heart rate normal rhythm, normal heart sounds, no murmurs Respiratory: Wet cough, Prolonged expiratory phase. No rales, rhonchi, or wheezing, normal effort.  ASSESSMENT/PLAN:   Hypertension Well-controlled with amlodipine 5 mg.  Patient encouraged to continue this.  We will discontinue losartan at this time. -Follow-up in 1 week BP check  COPD (chronic obstructive pulmonary disease) (HCC) Recently hospitalized for exacerbation continue steroid taper until finished.  Continue daily controller inhalers. -Smoking  cessation counseling at follow-up -Needs CT for lung cancer screening will address at follow-up -Follow-up in 1 week  Anxiety state PHQ-9 score 4, unfortunately GAD-7 not obtained during this visit.  Patient does however appear very anxious in conversation.  Counseled on decreasing long-term use of Xanax.  Encourage patient to only use with panic attacks.  Has a history of benzodiazepine abuse.  I suspect it will take several visits to establish rapport to wean off of benzos.  My current recommendation to the patient is to attempt to try Lexapro 10 mg we will discuss this at our follow-up visit.  For now we will continue use of Xanax 0.25 mg twice daily as needed.  Healthcare maintenance -Referral to GI for colonoscopy -Needs low dose CT for cancer screening will address at next visit follow-up -Pneumococcal 23 administered -Covid vaccine x2 Pfizer March 2021   Gerlene Fee, [place]

## 2019-12-02 NOTE — Assessment & Plan Note (Addendum)
PHQ-9 score 4, unfortunately GAD-7 not obtained during this visit.  Patient does however appear very anxious in conversation.  Counseled on decreasing long-term use of Xanax.  Encourage patient to only use with panic attacks.  Has a history of benzodiazepine abuse.  I suspect it will take several visits to establish rapport to wean off of benzos.  My current recommendation to the patient is to attempt to try Lexapro 10 mg we will discuss this at our follow-up visit.  For now we will continue use of Xanax 0.25 mg twice daily as needed.

## 2019-12-02 NOTE — Assessment & Plan Note (Addendum)
Recently hospitalized for exacerbation continue steroid taper until finished. Satting well on RA. Continue daily controller inhalers. -Smoking cessation counseling at follow-up -Needs CT for lung cancer screening will address at follow-up -Follow-up in 1 week

## 2019-12-02 NOTE — Assessment & Plan Note (Addendum)
-  Referral to GI for colonoscopy -Needs low dose CT for cancer screening will address at next visit follow-up -Pneumococcal 23 administered -Covid vaccine x2 Pfizer March 2021

## 2019-12-02 NOTE — Assessment & Plan Note (Addendum)
Well-controlled with amlodipine 5 mg.  Patient encouraged to continue this.  We will discontinue losartan at this time. -Follow-up in 1 week BP check

## 2019-12-07 ENCOUNTER — Ambulatory Visit: Payer: Medicare Other

## 2019-12-07 ENCOUNTER — Other Ambulatory Visit: Payer: Self-pay

## 2019-12-07 VITALS — BP 104/62 | HR 110

## 2019-12-07 DIAGNOSIS — I10 Essential (primary) hypertension: Secondary | ICD-10-CM

## 2019-12-07 NOTE — Progress Notes (Signed)
Patient presents in nurse clinic for BP check. Patient was very aggravated he was not seeing a doctor today. I explained at length the reason for BP check.   Patient reports discontinuing Losartan on 7/27 and is only taking Amlodipine 5mg  every morning. Last does this morning.   Patients blood pressure today: 104/62 HR: 110   Patient advised to continue taking Amlodipine 5mg  until he hears from PCP.   Of note, patient was very rude and demanding of his medications. Patient is out of xanax and is requesting another refill. Patient reports he is also "almost out" of his inhalers and nebulizer solution. I made him a FU apt with PCP for 8/27.

## 2019-12-09 ENCOUNTER — Encounter: Payer: Self-pay | Admitting: Internal Medicine

## 2019-12-09 ENCOUNTER — Other Ambulatory Visit: Payer: Self-pay

## 2019-12-09 DIAGNOSIS — J432 Centrilobular emphysema: Secondary | ICD-10-CM

## 2019-12-09 DIAGNOSIS — F411 Generalized anxiety disorder: Secondary | ICD-10-CM

## 2019-12-09 MED ORDER — ALBUTEROL SULFATE (2.5 MG/3ML) 0.083% IN NEBU: 2.5000 mg | INHALATION_SOLUTION | RESPIRATORY_TRACT | 2 refills | Status: DC | PRN

## 2019-12-09 MED ORDER — ALPRAZOLAM 0.25 MG PO TABS: 0.2500 mg | ORAL_TABLET | Freq: Two times a day (BID) | ORAL | 0 refills | Status: DC | PRN

## 2019-12-09 MED ORDER — ALBUTEROL SULFATE HFA 108 (90 BASE) MCG/ACT IN AERS: 2.0000 | INHALATION_SPRAY | RESPIRATORY_TRACT | 0 refills | Status: DC | PRN

## 2019-12-13 ENCOUNTER — Other Ambulatory Visit: Payer: Self-pay | Admitting: Family Medicine

## 2019-12-20 ENCOUNTER — Other Ambulatory Visit: Payer: Self-pay | Admitting: Family Medicine

## 2019-12-20 DIAGNOSIS — F411 Generalized anxiety disorder: Secondary | ICD-10-CM

## 2019-12-22 ENCOUNTER — Other Ambulatory Visit: Payer: Self-pay

## 2019-12-23 ENCOUNTER — Other Ambulatory Visit: Payer: Self-pay | Admitting: Family Medicine

## 2019-12-23 MED ORDER — AMLODIPINE BESYLATE 5 MG PO TABS: 5.0000 mg | ORAL_TABLET | Freq: Every day | ORAL | 0 refills | Status: DC

## 2019-12-29 ENCOUNTER — Other Ambulatory Visit: Payer: Self-pay | Admitting: Family Medicine

## 2019-12-29 ENCOUNTER — Other Ambulatory Visit: Payer: Self-pay | Admitting: *Deleted

## 2019-12-29 DIAGNOSIS — F411 Generalized anxiety disorder: Secondary | ICD-10-CM

## 2019-12-29 DIAGNOSIS — J432 Centrilobular emphysema: Secondary | ICD-10-CM

## 2019-12-29 MED ORDER — ALPRAZOLAM 0.25 MG PO TABS: ORAL_TABLET | ORAL | 0 refills | Status: DC

## 2019-12-29 MED ORDER — ALBUTEROL SULFATE HFA 108 (90 BASE) MCG/ACT IN AERS: 2.0000 | INHALATION_SPRAY | RESPIRATORY_TRACT | 5 refills | Status: DC | PRN

## 2019-12-29 MED ORDER — ALBUTEROL SULFATE (2.5 MG/3ML) 0.083% IN NEBU: 2.5000 mg | INHALATION_SOLUTION | RESPIRATORY_TRACT | 5 refills | Status: DC | PRN

## 2019-12-29 NOTE — Telephone Encounter (Signed)
Pt walks in frustrated with not getting his medications and ask to speak with a supervisor.  He expresses that he constantly runs out of his medications because he is not given refills.  He is currently out of albuterol pump, nebulizer meds and xanex (only 3 left).  He had to change his appt on Thursday to 9/2 due to a surprise beach trip with his family.  He and I discussed the need to call the pharmacy in advance before running out of meds and this could help alleviate some of the delay.  He also requested that we send in multiple refills so he does not have to call every month.  I advised that I would talk to Dr. Janus Molder about refills on the maintenance meds but the xanex could not have refills.  He expressed understanding.  Pt is requesting the attached meds to be sent ASAP so he can got to the beach tomorrow. Christen Bame, CMA

## 2019-12-29 NOTE — Addendum Note (Signed)
Addended by: Christen Bame D on: 12/29/2019 05:11 PM   Modules accepted: Orders

## 2019-12-31 ENCOUNTER — Ambulatory Visit: Payer: Medicare Other | Admitting: Family Medicine

## 2020-01-06 ENCOUNTER — Encounter: Payer: Self-pay | Admitting: Family Medicine

## 2020-01-06 ENCOUNTER — Other Ambulatory Visit: Payer: Self-pay

## 2020-01-06 ENCOUNTER — Ambulatory Visit (INDEPENDENT_AMBULATORY_CARE_PROVIDER_SITE_OTHER): Payer: Medicare Other | Admitting: Family Medicine

## 2020-01-06 VITALS — BP 120/80 | HR 87 | Wt 146.0 lb

## 2020-01-06 DIAGNOSIS — I10 Essential (primary) hypertension: Secondary | ICD-10-CM | POA: Diagnosis not present

## 2020-01-06 DIAGNOSIS — F411 Generalized anxiety disorder: Secondary | ICD-10-CM

## 2020-01-06 DIAGNOSIS — Z0000 Encounter for general adult medical examination without abnormal findings: Secondary | ICD-10-CM

## 2020-01-06 MED ORDER — ESCITALOPRAM OXALATE 10 MG PO TABS: 10.0000 mg | ORAL_TABLET | Freq: Every day | ORAL | 0 refills | Status: DC

## 2020-01-06 NOTE — Patient Instructions (Addendum)
It was nice to see you again today.  Today you were seen for your anxiety. I have prescribed Lexapro 10 mg daily.   Please follow up in 2 weeks for a mood check.   Go to [email or web address] to start therapy.  I have sent a referral to the gastroenterologist for a colonoscopy.They will call you to schedule this within a week. If you do not get a call within the week give [place] a call.   Your blood pressure looks good today. Continue taking your blood pressure medication.   Please call the clinic at [phone] if your symptoms worsen or you have any concerns. It was our pleasure to serve you.  Dr. Janus Molder

## 2020-01-06 NOTE — Progress Notes (Signed)
    SUBJECTIVE:   CHIEF COMPLAINT / HPI:   Anxiety Medication f/u Continues to have panic attack with unknown triggers. Uses xanax during these times. Continues to voice understanding that this not a long term medication. Agreeable to try a long term medication and therapy.  Hypertension Medications: Amlodipine 5 mg Compliance: Yes  Healthcare maintenance Has never had a colonoscopy. Current smoker, not ready to quit.   PERTINENT  PMH / PSH: COPD, Hx of benzodiazepine use  OBJECTIVE:   BP 120/80   Pulse 87   Wt 146 lb (66.2 kg)   SpO2 97%   BMI 24.30 kg/m   General: Appears well, no acute distress. Age appropriate. Cardiac: RRR, normal heart sounds, no murmurs Respiratory: CTAB, normal effort Psych: anxious, loud and fast speech. Quick shifts in mood.  ASSESSMENT/PLAN:   Anxiety state Continues to be anxious with several panic attacks without known triggers. Currently using xanax but agreeable to long term treatment today.  -Discontinue xanax -Start lexapro 10 mg daily -Start therapy with [email or web address] -F/u w/ mood check in 2 weeks   Hypertension BP controlled. Continue mediations.   Healthcare maintenance -Referral to GI for colonoscopy -1800QUITNOW discussed   Gerlene Fee, [place]

## 2020-01-11 NOTE — Assessment & Plan Note (Signed)
Continues to be anxious with several panic attacks without known triggers. Currently using xanax but agreeable to long term treatment today.  -Discontinue xanax -Start lexapro 10 mg daily -Start therapy with [email or web address] -F/u w/ mood check in 2 weeks

## 2020-01-11 NOTE — Assessment & Plan Note (Signed)
-  Referral to GI for colonoscopy -[id number] discussed

## 2020-01-11 NOTE — Assessment & Plan Note (Signed)
BP controlled. Continue mediations.

## 2020-01-24 ENCOUNTER — Other Ambulatory Visit: Payer: Self-pay | Admitting: Family Medicine

## 2020-01-31 ENCOUNTER — Telehealth: Payer: Self-pay

## 2020-01-31 NOTE — Telephone Encounter (Signed)
LMTCB -patient no showed pre visit appt today-  Will attempt to reach patient at a later time to reschedule pre visit appt;

## 2020-02-02 ENCOUNTER — Ambulatory Visit (AMBULATORY_SURGERY_CENTER): Payer: Medicare Other | Admitting: *Deleted

## 2020-02-02 ENCOUNTER — Other Ambulatory Visit: Payer: Self-pay

## 2020-02-02 VITALS — Ht 65.0 in | Wt 141.0 lb

## 2020-02-02 DIAGNOSIS — Z1211 Encounter for screening for malignant neoplasm of colon: Secondary | ICD-10-CM

## 2020-02-02 MED ORDER — NA SULFATE-K SULFATE-MG SULF 17.5-3.13-1.6 GM/177ML PO SOLN: ORAL | 0 refills | Status: DC

## 2020-02-02 NOTE — Progress Notes (Signed)
Patient is here in-person for PV. Patient denies any allergies to eggs or soy. Patient denies any problems with anesthesia/sedation. Patient denies any oxygen use at home. Patient denies taking any diet/weight loss medications or blood thinners. Patient is not being treated for MRSA or C-diff. Patient is aware of our care-partner policy and KDPTE-70 safety protocol. EMMI education assisgned to the patient for the procedure, sent to MyChart.   COVID-19 vaccines completed on 07/2019, per patient.

## 2020-02-08 ENCOUNTER — Other Ambulatory Visit: Payer: Self-pay | Admitting: Family Medicine

## 2020-02-08 DIAGNOSIS — F411 Generalized anxiety disorder: Secondary | ICD-10-CM

## 2020-02-09 ENCOUNTER — Other Ambulatory Visit: Payer: Self-pay | Admitting: Family Medicine

## 2020-02-09 DIAGNOSIS — F411 Generalized anxiety disorder: Secondary | ICD-10-CM

## 2020-02-10 ENCOUNTER — Other Ambulatory Visit: Payer: Self-pay | Admitting: Family Medicine

## 2020-02-10 DIAGNOSIS — F411 Generalized anxiety disorder: Secondary | ICD-10-CM

## 2020-02-14 ENCOUNTER — Encounter: Payer: Medicare Other | Admitting: Internal Medicine

## 2020-02-15 ENCOUNTER — Other Ambulatory Visit: Payer: Self-pay

## 2020-02-15 ENCOUNTER — Encounter: Payer: Self-pay | Admitting: Gastroenterology

## 2020-02-15 ENCOUNTER — Ambulatory Visit (AMBULATORY_SURGERY_CENTER): Payer: Medicare Other | Admitting: Gastroenterology

## 2020-02-15 VITALS — BP 126/69 | HR 63 | Temp 96.0°F | Resp 18 | Ht 65.0 in | Wt 141.0 lb

## 2020-02-15 DIAGNOSIS — Z1211 Encounter for screening for malignant neoplasm of colon: Secondary | ICD-10-CM | POA: Diagnosis not present

## 2020-02-15 DIAGNOSIS — D123 Benign neoplasm of transverse colon: Secondary | ICD-10-CM

## 2020-02-15 DIAGNOSIS — D122 Benign neoplasm of ascending colon: Secondary | ICD-10-CM

## 2020-02-15 DIAGNOSIS — D124 Benign neoplasm of descending colon: Secondary | ICD-10-CM

## 2020-02-15 MED ORDER — SODIUM CHLORIDE 0.9 % IV SOLN: 500.0000 mL | Freq: Once | INTRAVENOUS | Status: DC

## 2020-02-15 NOTE — Progress Notes (Signed)
Called to room to assist during endoscopic procedure.  Patient ID and intended procedure confirmed with present staff. Received instructions for my participation in the procedure from the performing physician.  

## 2020-02-15 NOTE — Progress Notes (Signed)
PT taken to PACU. Monitors in place. VSS. Report given to RN. 

## 2020-02-15 NOTE — Patient Instructions (Signed)
Please read handouts provided. Continue present medications. Await pathology results. Return to GI clinic as needed.     YOU HAD AN ENDOSCOPIC PROCEDURE TODAY AT THE [facility]:   Refer to the procedure report that was given to you for any specific questions about what was found during the examination.  If the procedure report does not answer your questions, please call your gastroenterologist to clarify.  If you requested that your care partner not be given the details of your procedure findings, then the procedure report has been included in a sealed envelope for you to review at your convenience later.  YOU SHOULD EXPECT: Some feelings of bloating in the abdomen. Passage of more gas than usual.  Walking can help get rid of the air that was put into your GI tract during the procedure and reduce the bloating. If you had a lower endoscopy (such as a colonoscopy or flexible sigmoidoscopy) you may notice spotting of blood in your stool or on the toilet paper. If you underwent a bowel prep for your procedure, you may not have a normal bowel movement for a few days.  Please Note:  You might notice some irritation and congestion in your nose or some drainage.  This is from the oxygen used during your procedure.  There is no need for concern and it should clear up in a day or so.  SYMPTOMS TO REPORT IMMEDIATELY:   Following lower endoscopy (colonoscopy or flexible sigmoidoscopy):  Excessive amounts of blood in the stool  Significant tenderness or worsening of abdominal pains  Swelling of the abdomen that is new, acute  Fever of 100F or higher    For urgent or emergent issues, a gastroenterologist can be reached at any hour by calling [phone]. Do not use MyChart messaging for urgent concerns.    DIET:  We do recommend a small meal at first, but then you may proceed to your regular diet.  Drink plenty of fluids but you should avoid alcoholic beverages for 24  hours.  ACTIVITY:  You should plan to take it easy for the rest of today and you should NOT DRIVE or use heavy machinery until tomorrow (because of the sedation medicines used during the test).    FOLLOW UP: Our staff will call the number listed on your records 48-72 hours following your procedure to check on you and address any questions or concerns that you may have regarding the information given to you following your procedure. If we do not reach you, we will leave a message.  We will attempt to reach you two times.  During this call, we will ask if you have developed any symptoms of COVID 19. If you develop any symptoms (ie: fever, flu-like symptoms, shortness of breath, cough etc.) before then, please call [phone].  If you test positive for Covid 19 in the 2 weeks post procedure, please call and report this information to us.    If any biopsies were taken you will be contacted by phone or by letter within the next 1-3 weeks.  Please call us at [phone] if you have not heard about the biopsies in 3 weeks.    SIGNATURES/CONFIDENTIALITY: You and/or your care partner have signed paperwork which will be entered into your electronic medical record.  These signatures attest to the fact that that the information above on your After Visit Summary has been reviewed and is understood.  Full responsibility of the confidentiality of this discharge information lies with you   and/or your care-partner. 

## 2020-02-15 NOTE — Op Note (Signed)
[place] Patient Name: Gavin Kane Procedure Date: 02/15/2020 2:03 PM MRN: [id number] Endoscopist: Gerrit Heck , MD Age: 64 Referring MD:  Date of Birth: Oct 08, 1955 Gender: Male Account #: [id number] Procedure:                Colonoscopy Indications:              Screening for colorectal malignant neoplasm, This                            is the patient's first colonoscopy Medicines:                Monitored Anesthesia Care Procedure:                Pre-Anesthesia Assessment:                           - Prior to the procedure, a History and Physical                            was performed, and patient medications and                            allergies were reviewed. The patient's tolerance of                            previous anesthesia was also reviewed. The risks                            and benefits of the procedure and the sedation                            options and risks were discussed with the patient.                            All questions were answered, and informed consent                            was obtained. Prior Anticoagulants: The patient has                            taken no previous anticoagulant or antiplatelet                            agents. ASA Grade Assessment: II - A patient with                            mild systemic disease. After reviewing the risks                            and benefits, the patient was deemed in                            satisfactory condition to undergo the procedure.  After obtaining informed consent, the colonoscope                            was passed under direct vision. Throughout the                            procedure, the patient's blood pressure, pulse, and                            oxygen saturations were monitored continuously. The                            Colonoscope was introduced through the anus and                            advanced to the the cecum,  identified by                            appendiceal orifice and ileocecal valve. The                            colonoscopy was performed without difficulty. The                            patient tolerated the procedure well. The quality                            of the bowel preparation was good. The ileocecal                            valve, appendiceal orifice, and rectum were                            photographed. Scope In: 2:12:22 PM Scope Out: 2:34:08 PM Scope Withdrawal Time: 0 hours 19 minutes 39 seconds  Total Procedure Duration: 0 hours 21 minutes 46 seconds  Findings:                 The perianal and digital rectal examinations were                            normal.                           Nine sessile polyps were found in the descending                            colon (1), hepatic flexure (3), and ascending colon                            (5). The polyps were 2 to 8 mm in size. These                            polyps were removed with a cold snare. The smallest  polyp was in a difficult position on the tip of a                            fold and could not be resected with cold snare, and                            was instead resected with cold forceps. Resection                            and retrieval were complete. Estimated blood loss                            was minimal.                           The retroflexed view of the distal rectum and anal                            verge was normal and showed no anal or rectal                            abnormalities. Complications:            No immediate complications. Estimated Blood Loss:     Estimated blood loss was minimal. Impression:               - Nine 2 to 8 mm polyps in the descending colon, at                            the hepatic flexure and in the ascending colon,                            removed with a cold snare x8 and cold forceps x1.                             Resected and retrieved.                           - The distal rectum and anal verge are normal on                            retroflexion view. Recommendation:           - Patient has a contact number available for                            emergencies. The signs and symptoms of potential                            delayed complications were discussed with the                            patient. Return to normal activities tomorrow.  Written discharge instructions were provided to the                            patient.                           - Resume previous diet.                           - Continue present medications.                           - Await pathology results.                           - Repeat colonoscopy in 3 years for surveillance,                            or sooner based on pathology results.                           - Return to GI office PRN. Gerrit Heck, MD 02/15/2020 2:39:46 PM

## 2020-02-15 NOTE — Progress Notes (Signed)
C.W. vital signs. 

## 2020-02-15 NOTE — Progress Notes (Signed)
Pt's states no medical or surgical changes since previsit or office visit. 

## 2020-02-17 ENCOUNTER — Telehealth: Payer: Self-pay | Admitting: *Deleted

## 2020-02-17 NOTE — Telephone Encounter (Signed)
  Follow up Call-  Call back number 02/15/2020  Post procedure Call Back phone  # [phone]  Permission to leave phone message Yes  Some recent data might be hidden     Patient questions:  Do you have a fever, pain , or abdominal swelling? No. Pain Score  0 *  Have you tolerated food without any problems? Yes.    Have you been able to return to your normal activities? Yes.    Do you have any questions about your discharge instructions: Diet   No. Medications  No. Follow up visit  No.  Do you have questions or concerns about your Care? No.  Actions: * If pain score is 4 or above: No action needed, pain <4  1. Have you developed a fever since your procedure? NO  2.   Have you had an respiratory symptoms (SOB or cough) since your procedure? NO  3.   Have you tested positive for COVID 19 since your procedure NO  4.   Have you had any family members/close contacts diagnosed with the COVID 19 since your procedure?  NO   If yes to any of these questions please route to Joylene John, RN and Joella Prince, RN

## 2020-02-25 ENCOUNTER — Encounter: Payer: Self-pay | Admitting: Gastroenterology

## 2020-03-17 ENCOUNTER — Other Ambulatory Visit: Payer: Self-pay | Admitting: Family Medicine

## 2020-03-17 DIAGNOSIS — F411 Generalized anxiety disorder: Secondary | ICD-10-CM

## 2020-04-20 ENCOUNTER — Other Ambulatory Visit: Payer: Self-pay | Admitting: Family Medicine

## 2020-04-20 DIAGNOSIS — F411 Generalized anxiety disorder: Secondary | ICD-10-CM

## 2020-04-27 ENCOUNTER — Other Ambulatory Visit: Payer: Self-pay | Admitting: Family Medicine

## 2020-04-28 ENCOUNTER — Other Ambulatory Visit: Payer: Self-pay | Admitting: Family Medicine

## 2020-05-04 ENCOUNTER — Ambulatory Visit
Admission: EM | Admit: 2020-05-04 | Discharge: 2020-05-04 | Disposition: A | Discharge status: Home / Self Care | Payer: Medicare Other | Attending: Emergency Medicine | Admitting: Emergency Medicine

## 2020-05-04 ENCOUNTER — Other Ambulatory Visit: Payer: Self-pay

## 2020-05-04 DIAGNOSIS — J069 Acute upper respiratory infection, unspecified: Secondary | ICD-10-CM | POA: Diagnosis not present

## 2020-05-04 DIAGNOSIS — J441 Chronic obstructive pulmonary disease with (acute) exacerbation: Secondary | ICD-10-CM | POA: Diagnosis not present

## 2020-05-04 MED ORDER — BENZONATATE 200 MG PO CAPS: 200.0000 mg | ORAL_CAPSULE | Freq: Three times a day (TID) | ORAL | 0 refills | Status: AC | PRN

## 2020-05-04 MED ORDER — ALBUTEROL SULFATE HFA 108 (90 BASE) MCG/ACT IN AERS: 1.0000 | INHALATION_SPRAY | Freq: Four times a day (QID) | RESPIRATORY_TRACT | 0 refills | Status: DC | PRN

## 2020-05-04 MED ORDER — PREDNISONE 50 MG PO TABS: 50.0000 mg | ORAL_TABLET | Freq: Every day | ORAL | 0 refills | Status: AC

## 2020-05-04 MED ORDER — DM-GUAIFENESIN ER 30-600 MG PO TB12: 1.0000 | ORAL_TABLET | Freq: Two times a day (BID) | ORAL | 0 refills | Status: DC

## 2020-05-04 MED ORDER — DOXYCYCLINE HYCLATE 100 MG PO CAPS: 100.0000 mg | ORAL_CAPSULE | Freq: Two times a day (BID) | ORAL | 0 refills | Status: AC

## 2020-05-04 NOTE — ED Triage Notes (Signed)
Pt presents with congestion, shortness of breath, and chills X 4 days.  Pt has COPD

## 2020-05-04 NOTE — Discharge Instructions (Signed)
Covid test pending, monitor my chart for results Prednisone daily for 5 days Doxycycline twice daily for 1 week Tessalon Mucinex D for cough and congestion Rest and fluids Follow-up if not improving or worsening

## 2020-05-04 NOTE — ED Provider Notes (Signed)
[facility]    CSN: [id number] Arrival date & time: 05/04/20  1058      History   Chief Complaint Chief Complaint  Patient presents with  . URI  . Chills    HPI Gavin Kane is a 64 y.o. male history of hypertension, COPD, presenting today for evaluation of congestion shortness of breath and chills.  Symptoms have been going on for approximately 4 days.  Feels similar to prior COPD flares and reports a lot of mucus in his chest.  Needing refill of albuterol.  Reports significant improvement with doxycycline in past.  Multiple family members sick as well.  HPI  Past Medical History:  Diagnosis Date  . Anxiety   . COPD (chronic obstructive pulmonary disease) ([place])   . HTN (hypertension)     Patient Active Problem List   Diagnosis Date Noted  . Healthcare maintenance 11/20/2018  . Cough 02/09/2018  . Non compliance w medication regimen 04/19/2016  . Benzodiazepine abuse ([place]) 12/28/2015  . Anxiety state 05/18/2014  . Chronic fatigue 12/01/2012  . COPD (chronic obstructive pulmonary disease) ([place]) 10/13/2012  . Hypertension 10/11/2012    Past Surgical History:  Procedure Laterality Date  . HEMORRHOID SURGERY         Home Medications    Prior to Admission medications   Medication Sig Start Date End Date Taking? Authorizing Provider  albuterol (VENTOLIN HFA) 108 (90 Base) MCG/ACT inhaler Inhale 1-2 puffs into the lungs every 6 (six) hours as needed for wheezing or shortness of breath. 05/04/20  Yes Fayola Meckes C, PA-C  benzonatate (TESSALON) 200 MG capsule Take 1 capsule (200 mg total) by mouth 3 (three) times daily as needed for up to 7 days for cough. 05/04/20 05/11/20 Yes Izel Eisenhardt C, PA-C  dextromethorphan-guaiFENesin (MUCINEX DM) 30-600 MG 12hr tablet Take 1 tablet by mouth 2 (two) times daily. 05/04/20  Yes Darlena Koval C, PA-C  doxycycline (VIBRAMYCIN) 100 MG capsule Take 1 capsule (100 mg total) by mouth 2 (two) times daily for 7  days. 05/04/20 05/11/20 Yes Laila Myhre C, PA-C  predniSONE (DELTASONE) 50 MG tablet Take 1 tablet (50 mg total) by mouth daily with breakfast for 5 days. 05/04/20 05/09/20 Yes Minoru Chap C, PA-C  amLODipine (NORVASC) 5 MG tablet TAKE 1 TABLET(5 MG) BY MOUTH DAILY 05/01/20   Autry-Lott, Naaman Plummer, DO  DULERA 200-5 MCG/ACT AERO INHALE 2 PUFFS INTO THE LUNGS TWICE DAILY 12/15/19   Autry-Lott, Naaman Plummer, DO  escitalopram (LEXAPRO) 10 MG tablet TAKE 1 TABLET(10 MG) BY MOUTH DAILY 04/20/20   Autry-Lott, Naaman Plummer, DO  tiotropium (SPIRIVA HANDIHALER) 18 MCG inhalation capsule Use on capsule with 2 oral inhalations once per day Patient taking differently: Place 18 mcg into inhaler and inhale daily. Use on capsule with 2 oral inhalations once per day 04/16/19   Nuala Alpha, DO  ipratropium (ATROVENT) 0.06 % nasal spray Place 2 sprays into both nostrils 4 (four) times daily. Patient not taking: Reported on 06/03/2019 11/23/18 06/22/19  Arturo Morton    Family History Family History  Problem Relation Age of Onset  . Cancer Other        Aunt  . Colon cancer Neg Hx   . Colon polyps Neg Hx   . Rectal cancer Neg Hx   . Stomach cancer Neg Hx     Social History Social History   Tobacco Use  . Smoking status: Current Every Day Smoker    Packs/day: 0.50    Years: 48.00  Pack years: 24.00    Types: Cigarettes    Start date: 05/06/1970  . Smokeless tobacco: Former Neurosurgeon  . Tobacco comment: 7-8 per day   Vaping Use  . Vaping Use: Never used  Substance Use Topics  . Alcohol use: Yes    Alcohol/week: 0.0 standard drinks    Comment: Social  . Drug use: No     Allergies   Patient has no known allergies.   Review of Systems Review of Systems  Constitutional: Negative for activity change, appetite change, chills, fatigue and fever.  HENT: Positive for congestion. Negative for ear pain, rhinorrhea, sinus pressure, sore throat and trouble swallowing.   Eyes: Negative for discharge and redness.   Respiratory: Positive for cough and shortness of breath. Negative for chest tightness.   Cardiovascular: Negative for chest pain.  Gastrointestinal: Negative for abdominal pain, diarrhea, nausea and vomiting.  Musculoskeletal: Negative for myalgias.  Skin: Negative for rash.  Neurological: Negative for dizziness, light-headedness and headaches.     Physical Exam Triage Vital Signs ED Triage Vitals  Enc Vitals Group     BP 05/04/20 1335 120/72     Pulse Rate 05/04/20 1335 90     Resp 05/04/20 1335 16     Temp 05/04/20 1335 98.1 F (36.7 C)     Temp Source 05/04/20 1335 Oral     SpO2 05/04/20 1335 95 %     Weight --      Height --      Head Circumference --      Peak Flow --      Pain Score 05/04/20 1334 2     Pain Loc --      Pain Edu? --      Excl. in GC? --    No data found.  Updated Vital Signs BP 120/72 (BP Location: Left Arm)   Pulse 90   Temp 98.1 F (36.7 C) (Oral)   Resp 16   SpO2 95%   Visual Acuity Right Eye Distance:   Left Eye Distance:   Bilateral Distance:    Right Eye Near:   Left Eye Near:    Bilateral Near:     Physical Exam Vitals and nursing note reviewed.  Constitutional:      Appearance: He is well-developed and well-nourished.     Comments: No acute distress  HENT:     Head: Normocephalic and atraumatic.     Ears:     Comments: Bilateral ears without tenderness to palpation of external auricle, tragus and mastoid, EAC's without erythema or swelling, TM's with good bony landmarks and cone of light. Non erythematous.     Nose: Nose normal.     Mouth/Throat:     Comments: Oral mucosa pink and moist, no tonsillar enlargement or exudate. Posterior pharynx patent and nonerythematous, no uvula deviation or swelling. Normal phonation. Eyes:     Conjunctiva/sclera: Conjunctivae normal.  Cardiovascular:     Rate and Rhythm: Normal rate.  Pulmonary:     Effort: Pulmonary effort is normal. No respiratory distress.     Comments: Breathing  comfortably at rest, CTABL, no wheezing, rales or other adventitious sounds auscultated Abdominal:     General: There is no distension.  Musculoskeletal:        General: Normal range of motion.     Cervical back: Neck supple.  Skin:    General: Skin is warm and dry.  Neurological:     Mental Status: He is alert and oriented to person, place,  and time.  Psychiatric:        Mood and Affect: Mood and affect normal.      UC Treatments / Results  Labs (all labs ordered are listed, but only abnormal results are displayed) Labs Reviewed  NOVEL CORONAVIRUS, NAA    EKG   Radiology No results found.  Procedures Procedures (including critical care time)  Medications Ordered in UC Medications - No data to display  Initial Impression / Assessment and Plan / UC Course  I have reviewed the triage vital signs and the nursing notes.  Pertinent labs & imaging results that were available during my care of the patient were reviewed by me and considered in my medical decision making (see chart for details).     Covid test pending, treating for COPD exacerbation with prednisone albuterol and doxycycline.  Rest and fluids, continue to monitor breathing.  Discussed strict return precautions. Patient verbalized understanding and is agreeable with plan.  Final Clinical Impressions(s) / UC Diagnoses   Final diagnoses:  Viral URI with cough  COPD exacerbation ([place])     Discharge Instructions     Covid test pending, monitor my chart for results Prednisone daily for 5 days Doxycycline twice daily for 1 week Tessalon Mucinex D for cough and congestion Rest and fluids Follow-up if not improving or worsening    ED Prescriptions    Medication Sig Dispense Auth. Provider   doxycycline (VIBRAMYCIN) 100 MG capsule Take 1 capsule (100 mg total) by mouth 2 (two) times daily for 7 days. 14 capsule Vidit Boissonneault C, PA-C   predniSONE (DELTASONE) 50 MG tablet Take 1 tablet (50 mg total) by  mouth daily with breakfast for 5 days. 5 tablet Jayona Mccaig C, PA-C   albuterol (VENTOLIN HFA) 108 (90 Base) MCG/ACT inhaler Inhale 1-2 puffs into the lungs every 6 (six) hours as needed for wheezing or shortness of breath. 18 g Kaylen Nghiem C, PA-C   benzonatate (TESSALON) 200 MG capsule Take 1 capsule (200 mg total) by mouth 3 (three) times daily as needed for up to 7 days for cough. 28 capsule Aissatou Fronczak C, PA-C   dextromethorphan-guaiFENesin (MUCINEX DM) 30-600 MG 12hr tablet Take 1 tablet by mouth 2 (two) times daily. 20 tablet Kajal Scalici, [place] C, PA-C     PDMP not reviewed this encounter.   Janith Lima, [place] 05/04/20 1431

## 2020-05-06 LAB — NOVEL CORONAVIRUS, NAA: SARS-CoV-2, NAA: DETECTED — AB

## 2020-05-06 LAB — SARS-COV-2, NAA 2 DAY TAT

## 2020-05-07 ENCOUNTER — Telehealth: Payer: Self-pay | Admitting: Infectious Diseases

## 2020-05-07 NOTE — Telephone Encounter (Signed)
Called to discuss with patient about COVID-19 symptoms and the use of one of the available treatments for those with mild to moderate Covid symptoms and at a high risk of hospitalization.     Pt appears to qualify for this infusion due to co-morbid conditions and/or a member of an at-risk group in accordance with the FDA Emergency Use Authorization.   Symptom onset: 05/01/20 Vaccinated: not documented  Qualified for Infusion: COPD history, unvaccinated, depression, hypertension.    Unable to reach the patient - LVM with call back information.   Rexene Alberts, MSN, NP-C [facility]  [email or web address] Pager: [phone] Office: [phone] [facility] Main Line: [phone]

## 2020-05-24 ENCOUNTER — Other Ambulatory Visit: Payer: Self-pay | Admitting: Family Medicine

## 2020-05-24 DIAGNOSIS — F411 Generalized anxiety disorder: Secondary | ICD-10-CM

## 2020-06-23 ENCOUNTER — Other Ambulatory Visit: Payer: Self-pay | Admitting: Family Medicine

## 2020-06-23 DIAGNOSIS — F411 Generalized anxiety disorder: Secondary | ICD-10-CM

## 2020-07-03 ENCOUNTER — Other Ambulatory Visit: Payer: Self-pay | Admitting: Family Medicine

## 2020-07-30 ENCOUNTER — Other Ambulatory Visit: Payer: Self-pay | Admitting: Family Medicine

## 2020-07-30 DIAGNOSIS — F411 Generalized anxiety disorder: Secondary | ICD-10-CM

## 2020-08-05 ENCOUNTER — Other Ambulatory Visit: Payer: Self-pay | Admitting: Family Medicine

## 2020-08-06 ENCOUNTER — Other Ambulatory Visit: Payer: Self-pay | Admitting: Family Medicine

## 2020-08-28 ENCOUNTER — Other Ambulatory Visit: Payer: Self-pay | Admitting: Family Medicine

## 2020-09-06 ENCOUNTER — Other Ambulatory Visit: Payer: Self-pay | Admitting: Family Medicine

## 2020-09-11 ENCOUNTER — Other Ambulatory Visit: Payer: Self-pay | Admitting: Family Medicine

## 2020-09-11 DIAGNOSIS — F411 Generalized anxiety disorder: Secondary | ICD-10-CM

## 2020-09-18 ENCOUNTER — Emergency Department (HOSPITAL_COMMUNITY)
Admission: EM | Admit: 2020-09-18 | Discharge: 2020-09-18 | Disposition: A | Discharge status: Home / Self Care | Payer: Medicare Other | Attending: Emergency Medicine | Admitting: Emergency Medicine

## 2020-09-18 ENCOUNTER — Emergency Department (HOSPITAL_COMMUNITY): Payer: Medicare Other

## 2020-09-18 ENCOUNTER — Other Ambulatory Visit: Payer: Self-pay

## 2020-09-18 ENCOUNTER — Encounter (HOSPITAL_COMMUNITY): Payer: Self-pay

## 2020-09-18 DIAGNOSIS — J441 Chronic obstructive pulmonary disease with (acute) exacerbation: Secondary | ICD-10-CM | POA: Insufficient documentation

## 2020-09-18 DIAGNOSIS — R0602 Shortness of breath: Secondary | ICD-10-CM | POA: Diagnosis present

## 2020-09-18 DIAGNOSIS — I10 Essential (primary) hypertension: Secondary | ICD-10-CM | POA: Diagnosis not present

## 2020-09-18 DIAGNOSIS — F17210 Nicotine dependence, cigarettes, uncomplicated: Secondary | ICD-10-CM | POA: Diagnosis not present

## 2020-09-18 DIAGNOSIS — Z79899 Other long term (current) drug therapy: Secondary | ICD-10-CM | POA: Insufficient documentation

## 2020-09-18 LAB — COMPREHENSIVE METABOLIC PANEL
ALT: 21 U/L (ref 0–44)
AST: 25 U/L (ref 15–41)
Albumin: 3.8 g/dL (ref 3.5–5.0)
Alkaline Phosphatase: 74 U/L (ref 38–126)
Anion gap: 7 (ref 5–15)
BUN: 12 mg/dL (ref 8–23)
CO2: 27 mmol/L (ref 22–32)
Calcium: 9 mg/dL (ref 8.9–10.3)
Chloride: 104 mmol/L (ref 98–111)
Creatinine, Ser: 0.68 mg/dL (ref 0.61–1.24)
GFR, Estimated: >60 mL/min (ref >60)
Glucose, Bld: 97 mg/dL (ref 70–99)
Potassium: 3.3 mmol/L — ABNORMAL LOW (ref 3.5–5.1)
Sodium: 138 mmol/L (ref 135–145)
Total Bilirubin: 1 mg/dL (ref 0.3–1.2)
Total Protein: 6.7 g/dL (ref 6.5–8.1)

## 2020-09-18 LAB — CBC WITH DIFFERENTIAL/PLATELET
Abs Immature Granulocytes: 0.02 10*3/uL (ref 0.00–0.07)
Basophils Absolute: 0 10*3/uL (ref 0.0–0.1)
Basophils Relative: 0 %
Eosinophils Absolute: 0.1 10*3/uL (ref 0.0–0.5)
Eosinophils Relative: 2 %
HCT: 40.8 % (ref 39.0–52.0)
Hemoglobin: 13.2 g/dL (ref 13.0–17.0)
Immature Granulocytes: 0 %
Lymphocytes Relative: 22 %
Lymphs Abs: 1.5 10*3/uL (ref 0.7–4.0)
MCH: 29.4 pg (ref 26.0–34.0)
MCHC: 32.4 g/dL (ref 30.0–36.0)
MCV: 90.9 fL (ref 80.0–100.0)
Monocytes Absolute: 0.7 10*3/uL (ref 0.1–1.0)
Monocytes Relative: 10 %
Neutro Abs: 4.6 10*3/uL (ref 1.7–7.7)
Neutrophils Relative %: 66 %
Platelets: 212 10*3/uL (ref 150–400)
RBC: 4.49 MIL/uL (ref 4.22–5.81)
RDW: 14.4 % (ref 11.5–15.5)
WBC: 7 10*3/uL (ref 4.0–10.5)
nRBC: 0 % (ref 0.0–0.2)

## 2020-09-18 MED ORDER — PREDNISONE 10 MG PO TABS: 40.0000 mg | ORAL_TABLET | Freq: Every day | ORAL | 0 refills | Status: AC

## 2020-09-18 MED ORDER — METHYLPREDNISOLONE SODIUM SUCC 125 MG IJ SOLR
125.0000 mg | Freq: Once | INTRAMUSCULAR | Status: AC
  Administered 2020-09-18: 125 mg via INTRAVENOUS
  Filled 2020-09-18: qty 2

## 2020-09-18 MED ORDER — PREDNISONE 20 MG PO TABS: 60.0000 mg | ORAL_TABLET | Freq: Once | ORAL | Status: DC

## 2020-09-18 MED ORDER — ALBUTEROL SULFATE HFA 108 (90 BASE) MCG/ACT IN AERS
4.0000 | INHALATION_SPRAY | Freq: Once | RESPIRATORY_TRACT | Status: AC
  Administered 2020-09-18: 4 via RESPIRATORY_TRACT
  Filled 2020-09-18: qty 6.7

## 2020-09-18 MED ORDER — IPRATROPIUM BROMIDE HFA 17 MCG/ACT IN AERS
2.0000 | INHALATION_SPRAY | Freq: Once | RESPIRATORY_TRACT | Status: AC
  Administered 2020-09-18: 2 via RESPIRATORY_TRACT
  Filled 2020-09-18: qty 12.9

## 2020-09-18 NOTE — ED Notes (Signed)
Brought pt snack and drink 

## 2020-09-18 NOTE — ED Provider Notes (Signed)
[facility] DEPT Provider Note   CSN: [id number] Arrival date & time: 09/18/20  1003     History Chief Complaint  Patient presents with  . Shortness of Breath    Gavin Kane is a 65 y.o. male.  HPI     65 year old male with history of anxiety, COPD, hypertension presents with concern for shortness of breath.  He presents from home, tried albuterol nebulizer at home without relief  All day yesterday was having shortness of breath No wheezing or cough, felt like couldn't breath. Not sure if anxiety. No chest pain. Less bad today but wanted to get it checked out because not back to normal today Symptoms not as bad today but present, called EMS, received O2 on way, no steroid Feels improved now, back to self No leg pain or swelling, no fever No dyspnea with laying flat If get up and walk will feel dyspnea, has been like that for a while but worse yesterday. No chest pain, arm pain, neck pain.   Smoking cigarettes, occ etoh, no drugs No family hx of early CAD      Past Medical History:  Diagnosis Date  . Anxiety   . COPD (chronic obstructive pulmonary disease) ([place])   . HTN (hypertension)     Patient Active Problem List   Diagnosis Date Noted  . Healthcare maintenance 11/20/2018  . Cough 02/09/2018  . Non compliance w medication regimen 04/19/2016  . Benzodiazepine abuse ([place]) 12/28/2015  . Anxiety state 05/18/2014  . Chronic fatigue 12/01/2012  . COPD (chronic obstructive pulmonary disease) ([place]) 10/13/2012  . Hypertension 10/11/2012    Past Surgical History:  Procedure Laterality Date  . HEMORRHOID SURGERY         Family History  Problem Relation Age of Onset  . Cancer Other        Aunt  . Colon cancer Neg Hx   . Colon polyps Neg Hx   . Rectal cancer Neg Hx   . Stomach cancer Neg Hx     Social History   Tobacco Use  . Smoking status: Current Every Day Smoker    Packs/day: 0.50    Years: 48.00    Pack years:  24.00    Types: Cigarettes    Start date: 05/06/1970  . Smokeless tobacco: Former Systems developer  . Tobacco comment: 7-8 per day   Vaping Use  . Vaping Use: Never used  Substance Use Topics  . Alcohol use: Yes    Alcohol/week: 0.0 standard drinks    Comment: Social  . Drug use: No    Home Medications Prior to Admission medications   Medication Sig Start Date End Date Taking? Authorizing Provider  predniSONE (DELTASONE) 10 MG tablet Take 4 tablets (40 mg total) by mouth daily for 4 days. 09/18/20 09/22/20 Yes Dietrick Barris, Junie Panning, MD  albuterol (PROVENTIL) (2.5 MG/3ML) 0.083% nebulizer solution TAKE 3 MLS(2.5 MG TOTAL) BY NEBULIZATION EVERY 4 HOURS AS NEEDED FOR WHEEZING OR SHORTNESS OF BREATH 06/23/20   Simmons-Robinson, Makiera, MD  albuterol (VENTOLIN HFA) 108 (90 Base) MCG/ACT inhaler INHALE 2 PUFFS INTO THE LUNGS EVERY 4 HOURS AS NEEDED FOR WHEEZING OR SHORTNESS OF BREATH OR COUGH 09/06/20   Autry-Lott, Naaman Plummer, DO  amLODipine (NORVASC) 5 MG tablet TAKE 1 TABLET(5 MG) BY MOUTH DAILY 08/07/20   Carollee Leitz, MD  [facility] DM) 30-600 MG 12hr tablet Take 1 tablet by mouth 2 (two) times daily. 05/04/20   Wieters, Hallie C, PA-C  DULERA 200-5 MCG/ACT AERO INHALE 2  PUFFS INTO THE LUNGS TWICE DAILY 05/24/20   Autry-Lott, Naaman Plummer, DO  escitalopram (LEXAPRO) 10 MG tablet TAKE 1 TABLET(10 MG) BY MOUTH DAILY 09/11/20   Autry-Lott, Naaman Plummer, DO  tiotropium (SPIRIVA HANDIHALER) 18 MCG inhalation capsule Use on capsule with 2 oral inhalations once per day Patient taking differently: Place 18 mcg into inhaler and inhale daily. Use on capsule with 2 oral inhalations once per day 04/16/19   Nuala Alpha, DO  ipratropium (ATROVENT) 0.06 % nasal spray Place 2 sprays into both nostrils 4 (four) times daily. Patient not taking: Reported on 06/03/2019 11/23/18 06/22/19  Ok Edwards, PA-C    Allergies    Patient has no known allergies.  Review of Systems   Review of Systems  Constitutional: Negative for  fever.  HENT: Negative for sore throat.   Eyes: Negative for visual disturbance.  Respiratory: Positive for shortness of breath. Negative for cough and wheezing.   Cardiovascular: Negative for chest pain and leg swelling.  Gastrointestinal: Negative for abdominal pain, nausea and vomiting.  Genitourinary: Negative for difficulty urinating.  Musculoskeletal: Negative for back pain and neck stiffness.  Skin: Negative for rash.  Neurological: Negative for syncope and headaches.    Physical Exam Updated Vital Signs BP 135/80   Pulse 78   Temp 98 F (36.7 C) (Oral)   Resp 16   Ht 5\' 5"  (1.651 m)   Wt 63.5 kg   SpO2 96%   BMI 23.30 kg/m   Physical Exam Vitals and nursing note reviewed.  Constitutional:      General: He is not in acute distress.    Appearance: He is well-developed. He is not diaphoretic.  HENT:     Head: Normocephalic and atraumatic.  Eyes:     Conjunctiva/sclera: Conjunctivae normal.  Cardiovascular:     Rate and Rhythm: Normal rate and regular rhythm.     Heart sounds: Normal heart sounds. No murmur heard. No friction rub. No gallop.   Pulmonary:     Effort: Pulmonary effort is normal. No respiratory distress.     Breath sounds: Wheezing present. No rales.  Abdominal:     General: There is no distension.     Palpations: Abdomen is soft.     Tenderness: There is no abdominal tenderness. There is no guarding.  Musculoskeletal:     Cervical back: Normal range of motion.  Skin:    General: Skin is warm and dry.  Neurological:     Mental Status: He is alert and oriented to person, place, and time.     ED Results / Procedures / Treatments   Labs (all labs ordered are listed, but only abnormal results are displayed) Labs Reviewed  COMPREHENSIVE METABOLIC PANEL - Abnormal; Notable for the following components:      Result Value   Potassium 3.3 (*)    All other components within normal limits  CBC WITH DIFFERENTIAL/PLATELET    EKG EKG  Interpretation  Date/Time:  Monday Sep 18 2020 10:50:16 EDT Ventricular Rate:  86 PR Interval:  158 QRS Duration: 87 QT Interval:  377 QTC Calculation: 451 R Axis:   76 Text Interpretation: Sinus rhythm No significant change since last tracing Confirmed by Gareth Morgan [phone]) on 09/18/2020 11:12:29 AM   Radiology DG Chest Portable 1 View  Result Date: 09/18/2020 CLINICAL DATA:  Shortness of breath EXAM: PORTABLE CHEST 1 VIEW COMPARISON:  11/19/2019 FINDINGS: Heart and mediastinal contours are within normal limits. No focal opacities or effusions. No acute bony abnormality. IMPRESSION: No  active disease. Electronically Signed   By: Rolm Baptise M.D.   On: 09/18/2020 10:56    Procedures Procedures   Medications Ordered in ED Medications  albuterol (VENTOLIN HFA) 108 (90 Base) MCG/ACT inhaler 4 puff (4 puffs Inhalation Given 09/18/20 1121)  ipratropium (ATROVENT HFA) inhaler 2 puff (2 puffs Inhalation Given 09/18/20 1121)  methylPREDNISolone sodium succinate (SOLU-MEDROL) 125 mg/2 mL injection 125 mg (125 mg Intravenous Given 09/18/20 1121)    ED Course  I have reviewed the triage vital signs and the nursing notes.  Pertinent labs & imaging results that were available during my care of the patient were reviewed by me and considered in my medical decision making (see chart for details).    MDM Rules/Calculators/A&P                          65 year old male with history of anxiety, COPD, hypertension presents with concern for shortness of breath. Differential diagnosis for dyspnea includes ACS, PE, COPD exacerbation, CHF exacerbation, anemia, pneumonia, viral etiology such as COVID 19 infection, metabolic abnormality.  Chest x-ray was done which showed no acute abnormalities. EKG was evaluated by me which showed no acute changes.   Do not suspect CHF by history and exam. Denies CP, doubt ACS.   Wheezing on exam, improved following albuterol atrovent and steroids.  Feels back to  baseline. Exam and history most consistent with COPD exacerbation.  Patient discharged in stable condition with understanding of reasons to return.     Final Clinical Impression(s) / ED Diagnoses Final diagnoses:  COPD exacerbation ([place])    Rx / DC Orders ED Discharge Orders         Ordered    predniSONE (DELTASONE) 10 MG tablet  Daily        09/18/20 1252           Gareth Morgan, MD 09/18/20 2302

## 2020-09-18 NOTE — ED Triage Notes (Signed)
Pt comes from home, called EMS for SOB. Pt was tachypnic on arrival,hx of COPD, no O2 at home- tried albuterol neb at home w/o relief, no weezing noted- >95%ra. A&O X4  rr 16 bp 147/93 99%ra

## 2020-09-18 NOTE — ED Notes (Signed)
Pt pt on 1.5 L [place] for comfort, pt was 95% or above on ra

## 2020-10-21 ENCOUNTER — Other Ambulatory Visit: Payer: Self-pay | Admitting: Family Medicine

## 2020-10-23 NOTE — Telephone Encounter (Signed)
Patient has had an ED visit for COPD. He should make an appointment.   Gerlene Fee, DO 10/23/2020, 4:33 PM PGY-2, [facility]

## 2020-10-24 ENCOUNTER — Other Ambulatory Visit: Payer: Self-pay | Admitting: Family Medicine

## 2020-10-24 DIAGNOSIS — F411 Generalized anxiety disorder: Secondary | ICD-10-CM

## 2020-11-13 ENCOUNTER — Other Ambulatory Visit: Payer: Self-pay | Admitting: Family Medicine

## 2020-11-15 ENCOUNTER — Other Ambulatory Visit: Payer: Self-pay | Admitting: Family Medicine

## 2020-11-22 ENCOUNTER — Other Ambulatory Visit: Payer: Self-pay | Admitting: Family Medicine

## 2020-12-09 ENCOUNTER — Other Ambulatory Visit: Payer: Self-pay | Admitting: Family Medicine

## 2020-12-09 DIAGNOSIS — F411 Generalized anxiety disorder: Secondary | ICD-10-CM

## 2020-12-13 ENCOUNTER — Other Ambulatory Visit: Payer: Self-pay | Admitting: Family Medicine

## 2020-12-15 ENCOUNTER — Other Ambulatory Visit: Payer: Self-pay | Admitting: Family Medicine

## 2020-12-19 ENCOUNTER — Telehealth: Payer: Self-pay

## 2020-12-19 NOTE — Telephone Encounter (Signed)
LVM to have pt call back to schedule AWV.   RE: confirm insurance and schedule AWV on my schedule if times are convenient for patient or other AWV schedule as template permits.   

## 2021-01-20 ENCOUNTER — Other Ambulatory Visit: Payer: Self-pay | Admitting: Family Medicine

## 2021-01-20 DIAGNOSIS — F411 Generalized anxiety disorder: Secondary | ICD-10-CM

## 2021-01-30 ENCOUNTER — Other Ambulatory Visit: Payer: Self-pay | Admitting: Family Medicine

## 2021-02-11 ENCOUNTER — Encounter (HOSPITAL_COMMUNITY): Payer: Self-pay

## 2021-02-11 ENCOUNTER — Inpatient Hospital Stay (HOSPITAL_COMMUNITY)
Admission: EM | Admit: 2021-02-11 | Discharge: 2021-02-16 | DRG: 190 | Disposition: A | Discharge status: Home / Self Care | Payer: Medicare Other | Attending: Family Medicine | Admitting: Family Medicine

## 2021-02-11 ENCOUNTER — Emergency Department (HOSPITAL_COMMUNITY): Payer: Medicare Other

## 2021-02-11 ENCOUNTER — Other Ambulatory Visit: Payer: Self-pay

## 2021-02-11 DIAGNOSIS — D649 Anemia, unspecified: Secondary | ICD-10-CM | POA: Diagnosis present

## 2021-02-11 DIAGNOSIS — T380X5A Adverse effect of glucocorticoids and synthetic analogues, initial encounter: Secondary | ICD-10-CM | POA: Diagnosis present

## 2021-02-11 DIAGNOSIS — Z7951 Long term (current) use of inhaled steroids: Secondary | ICD-10-CM

## 2021-02-11 DIAGNOSIS — J9601 Acute respiratory failure with hypoxia: Secondary | ICD-10-CM | POA: Diagnosis present

## 2021-02-11 DIAGNOSIS — R0902 Hypoxemia: Secondary | ICD-10-CM | POA: Diagnosis not present

## 2021-02-11 DIAGNOSIS — J441 Chronic obstructive pulmonary disease with (acute) exacerbation: Secondary | ICD-10-CM | POA: Diagnosis not present

## 2021-02-11 DIAGNOSIS — I10 Essential (primary) hypertension: Secondary | ICD-10-CM | POA: Diagnosis present

## 2021-02-11 DIAGNOSIS — R739 Hyperglycemia, unspecified: Secondary | ICD-10-CM | POA: Diagnosis present

## 2021-02-11 DIAGNOSIS — D72829 Elevated white blood cell count, unspecified: Secondary | ICD-10-CM | POA: Diagnosis present

## 2021-02-11 DIAGNOSIS — F17210 Nicotine dependence, cigarettes, uncomplicated: Secondary | ICD-10-CM | POA: Diagnosis present

## 2021-02-11 DIAGNOSIS — E876 Hypokalemia: Secondary | ICD-10-CM | POA: Diagnosis present

## 2021-02-11 DIAGNOSIS — Z20822 Contact with and (suspected) exposure to covid-19: Secondary | ICD-10-CM | POA: Diagnosis present

## 2021-02-11 DIAGNOSIS — Z79899 Other long term (current) drug therapy: Secondary | ICD-10-CM

## 2021-02-11 DIAGNOSIS — F419 Anxiety disorder, unspecified: Secondary | ICD-10-CM | POA: Diagnosis present

## 2021-02-11 LAB — BLOOD GAS, VENOUS
Acid-base deficit: 0.1 mmol/L (ref 0.0–2.0)
Bicarbonate: 25.3 mmol/L (ref 20.0–28.0)
O2 Saturation: 98.1 %
Patient temperature: 98.6
pCO2, Ven: 46.7 mmHg (ref 44.0–60.0)
pH, Ven: 7.353 (ref 7.250–7.430)
pO2, Ven: 111 mmHg — ABNORMAL HIGH (ref 32.0–45.0)

## 2021-02-11 LAB — BASIC METABOLIC PANEL
Anion gap: 10 (ref 5–15)
BUN: 14 mg/dL (ref 8–23)
CO2: 25 mmol/L (ref 22–32)
Calcium: 8.6 mg/dL — ABNORMAL LOW (ref 8.9–10.3)
Chloride: 104 mmol/L (ref 98–111)
Creatinine, Ser: 0.84 mg/dL (ref 0.61–1.24)
GFR, Estimated: >60 mL/min (ref >60)
Glucose, Bld: 202 mg/dL — ABNORMAL HIGH (ref 70–99)
Potassium: 5.1 mmol/L (ref 3.5–5.1)
Sodium: 139 mmol/L (ref 135–145)

## 2021-02-11 MED ORDER — ALBUTEROL SULFATE (2.5 MG/3ML) 0.083% IN NEBU
INHALATION_SOLUTION | RESPIRATORY_TRACT | Status: AC
  Filled 2021-02-11: qty 3

## 2021-02-11 MED ORDER — ALBUTEROL (5 MG/ML) CONTINUOUS INHALATION SOLN
10.0000 mg/h | INHALATION_SOLUTION | RESPIRATORY_TRACT | Status: DC
  Administered 2021-02-11: 23:00:00 10 mg/h via RESPIRATORY_TRACT
  Filled 2021-02-11: qty 20

## 2021-02-11 NOTE — ED Triage Notes (Signed)
Pt to ED by EMS from home with c/o SOB which began 24 hours ago. Pt has hx of COPD, wheezing noted throughout, pt is tachypnic and tachycardic and hypertensive. Arrives on 15L [place]. RT at bedside on arrival.

## 2021-02-11 NOTE — ED Provider Notes (Signed)
[facility] DEPT Provider Note: Georgena Spurling, MD, FACEP  CSN: [id number] MRN: [id number] ARRIVAL: 02/11/21 at [employer]: [place] of Breath  Level 5 caveat: Respiratory distress HISTORY OF PRESENT ILLNESS  02/11/21 11:10 PM Gavin Kane is a 65 y.o. male with a history of COPD.  He is here with shortness of breath that began this morning and has worsened throughout the day, despite home nebulizer use.  It became severe and he called EMS.  Prior to arrival he was given 2 DuoNeb treatments, magnesium sulfate IV, Solu-Medrol IV and epinephrine.  These had partial improvement in his symptoms.  He denies chest pain.  He was placed on BiPAP by respiratory therapy prior to my evaluation.    Past Medical History:  Diagnosis Date   Anxiety    COPD (chronic obstructive pulmonary disease) ([facility])    HTN (hypertension)     Past Surgical History:  Procedure Laterality Date   HEMORRHOID SURGERY      Family History  Problem Relation Age of Onset   Cancer Other        Aunt   Colon cancer Neg Hx    Colon polyps Neg Hx    Rectal cancer Neg Hx    Stomach cancer Neg Hx     Social History   Tobacco Use   Smoking status: Every Day    Packs/day: 0.50    Years: 48.00    Pack years: 24.00    Types: Cigarettes    Start date: 05/06/1970   Smokeless tobacco: Former   Tobacco comments:    7-8 per day   Vaping Use   Vaping Use: Never used  Substance Use Topics   Alcohol use: Yes    Alcohol/week: 0.0 standard drinks    Comment: Social   Drug use: No    Prior to Admission medications   Medication Sig Start Date End Date Taking? Authorizing Provider  albuterol (PROVENTIL) (2.5 MG/3ML) 0.083% nebulizer solution TAKE 3 MLS(2.5 MG TOTAL) BY NEBULIZATION EVERY 4 HOURS AS NEEDED FOR WHEEZING OR SHORTNESS OF BREATH 11/23/20  Yes Autry-Lott, Naaman Plummer, DO  amLODipine (NORVASC) 5 MG tablet TAKE 1 TABLET(5 MG) BY MOUTH DAILY 11/13/20  Yes Autry-Lott, Simone, DO   escitalopram (LEXAPRO) 10 MG tablet TAKE 1 TABLET(10 MG) BY MOUTH DAILY 01/22/21  Yes Autry-Lott, Naaman Plummer, DO  tiotropium (SPIRIVA HANDIHALER) 18 MCG inhalation capsule Use on capsule with 2 oral inhalations once per day Patient taking differently: Place 18 mcg into inhaler and inhale daily. Use on capsule with 2 oral inhalations once per day 04/16/19  Yes Nuala Alpha, MD  DULERA 200-5 MCG/ACT AERO INHALE 2 PUFFS INTO THE LUNGS TWICE DAILY 11/13/20   Autry-Lott, Naaman Plummer, DO  ipratropium (ATROVENT) 0.06 % nasal spray Place 2 sprays into both nostrils 4 (four) times daily. Patient not taking: Reported on 06/03/2019 11/23/18 06/22/19  Ok Edwards, PA-C    Allergies Patient has no known allergies.   REVIEW OF SYSTEMS  Level 5 caveat   PHYSICAL EXAMINATION  Initial Vital Signs Blood pressure (!) 175/124, pulse (!) 144, temperature 98.5 F (36.9 C), temperature source Oral, resp. rate (!) 29, height 5\' 5"  (1.651 m), weight 63.5 kg, SpO2 100 %.  Examination General: Well-developed, well-nourished male in mild distress; appearance consistent with age of record HENT: normocephalic; atraumatic Eyes: Normal appearance Neck: supple Heart: regular rate and rhythm; tachycardia Lungs: Tachypnea; shallow breaths; expiratory wheezes Abdomen: soft; nondistended; nontender; bowel sounds present Extremities: No deformity; full  range of motion; pulses normal Neurologic: Awake, alert; motor function intact in all extremities and symmetric; no facial droop Skin: Warm and dry; diaphoretic Psychiatric: Anxious   RESULTS  Summary of this visit's results, reviewed and interpreted by myself:   EKG Interpretation  Date/Time:    Ventricular Rate:    PR Interval:    QRS Duration:   QT Interval:    QTC Calculation:   R Axis:     Text Interpretation:         Laboratory Studies: Results for orders placed or performed during the hospital encounter of 02/11/21 (from the past 24 hour(s))  CBC with  Differential/Platelet     Status: Abnormal   Collection Time: 02/11/21 11:04 PM  Result Value Ref Range   WBC 7.9 4.0 - 10.5 K/uL   RBC 4.60 4.22 - 5.81 MIL/uL   Hemoglobin 13.8 13.0 - 17.0 g/dL   HCT 42.0 39.0 - 52.0 %   MCV 91.3 80.0 - 100.0 fL   MCH 30.0 26.0 - 34.0 pg   MCHC 32.9 30.0 - 36.0 g/dL   RDW 15.9 (H) 11.5 - 15.5 %   Platelets 317 150 - 400 K/uL   nRBC 0.0 0.0 - 0.2 %   Neutrophils Relative % 57 %   Neutro Abs 4.5 1.7 - 7.7 K/uL   Lymphocytes Relative 32 %   Lymphs Abs 2.6 0.7 - 4.0 K/uL   Monocytes Relative 10 %   Monocytes Absolute 0.8 0.1 - 1.0 K/uL   Eosinophils Relative 0 %   Eosinophils Absolute 0.0 0.0 - 0.5 K/uL   Basophils Relative 1 %   Basophils Absolute 0.1 0.0 - 0.1 K/uL   Immature Granulocytes 0 %   Abs Immature Granulocytes 0.03 0.00 - 0.07 K/uL  Basic metabolic panel     Status: Abnormal   Collection Time: 02/11/21 11:04 PM  Result Value Ref Range   Sodium 139 135 - 145 mmol/L   Potassium 5.1 3.5 - 5.1 mmol/L   Chloride 104 98 - 111 mmol/L   CO2 25 22 - 32 mmol/L   Glucose, Bld 202 (H) 70 - 99 mg/dL   BUN 14 8 - 23 mg/dL   Creatinine, Ser 0.84 0.61 - 1.24 mg/dL   Calcium 8.6 (L) 8.9 - 10.3 mg/dL   GFR, Estimated >60 >60 mL/min   Anion gap 10 5 - 15  Blood gas, venous (at [facility] and [facility], not at [facility])     Status: Abnormal   Collection Time: 02/11/21 11:40 PM  Result Value Ref Range   pH, Ven 7.353 7.250 - 7.430   pCO2, Ven 46.7 44.0 - 60.0 mmHg   pO2, Ven 111.0 (H) 32.0 - 45.0 mmHg   Bicarbonate 25.3 20.0 - 28.0 mmol/L   Acid-base deficit 0.1 0.0 - 2.0 mmol/L   O2 Saturation 98.1 %   Patient temperature 98.6    Imaging Studies: DG Chest Port 1 View  Result Date: 02/12/2021 CLINICAL DATA:  Shortness of breath beginning 24 hours ago. EXAM: PORTABLE CHEST 1 VIEW COMPARISON:  09/18/2020 FINDINGS: The heart size and mediastinal contours are within normal limits. Both lungs are clear. The visualized skeletal structures are unremarkable.  IMPRESSION: No active disease. Electronically Signed   By: Lucienne Capers M.D.   On: 02/12/2021 00:20    ED COURSE and MDM  Nursing notes, initial and subsequent vitals signs, including pulse oximetry, reviewed and interpreted by myself.  Vitals:   02/12/21 0100 02/12/21 0200 02/12/21 0230 02/12/21 0300  BP: (!) 147/81 (!) 162/95 (!) 167/122 (!) 151/92  Pulse: (!) 119 (!) 111 (!) 119 (!) 118  Resp: (!) 23 19 19  (!) 25  Temp:      TempSrc:      SpO2: 96% 96% 93% 96%  Weight:      Height:       Medications  albuterol (PROVENTIL,VENTOLIN) solution continuous neb (10 mg/hr Nebulization New Bag/Given 02/11/21 2311)  albuterol (PROVENTIL) (2.5 MG/3ML) 0.083% nebulizer solution (  Not Given 02/11/21 2311)  albuterol (VENTOLIN HFA) 108 (90 Base) MCG/ACT inhaler 2 puff (2 puffs Inhalation Given 02/12/21 0418)  aerochamber plus with mask device 1 each (has no administration in time range)   11:57 PM Patient more comfortable now on BiPAP with continuous albuterol neb.   Patient able to be weaned off of BiPAP and has been resting comfortably.  He is not wheezing.  4:36 AM No wheezing but patient's air movement still somewhat diminished.  Oxygen saturation 89 to 90% on room air.  Patient still gets jittery and dyspneic when ambulating.  Will have admitted.   PROCEDURES  Procedures CRITICAL CARE Performed by: Karen Chafe Talal Fritchman Total critical care time: 30 minutes Critical care time was exclusive of separately billable procedures and treating other patients. Critical care was necessary to treat or prevent imminent or life-threatening deterioration. Critical care was time spent personally by me on the following activities: development of treatment plan with patient and/or surrogate as well as nursing, discussions with consultants, evaluation of patient's response to treatment, examination of patient, obtaining history from patient or surrogate, ordering and performing treatments and interventions,  ordering and review of laboratory studies, ordering and review of radiographic studies, pulse oximetry and re-evaluation of patient's condition.   ED DIAGNOSES     ICD-10-CM   1. COPD exacerbation ([place])  J44.1     2. Hypoxia  R09.02          Shanon Rosser, MD 02/12/21 [phone]

## 2021-02-12 ENCOUNTER — Encounter (HOSPITAL_COMMUNITY): Payer: Self-pay | Admitting: Family Medicine

## 2021-02-12 DIAGNOSIS — F419 Anxiety disorder, unspecified: Secondary | ICD-10-CM

## 2021-02-12 DIAGNOSIS — Z79899 Other long term (current) drug therapy: Secondary | ICD-10-CM | POA: Diagnosis not present

## 2021-02-12 DIAGNOSIS — J441 Chronic obstructive pulmonary disease with (acute) exacerbation: Secondary | ICD-10-CM | POA: Diagnosis present

## 2021-02-12 DIAGNOSIS — T380X5A Adverse effect of glucocorticoids and synthetic analogues, initial encounter: Secondary | ICD-10-CM | POA: Diagnosis present

## 2021-02-12 DIAGNOSIS — Z20822 Contact with and (suspected) exposure to covid-19: Secondary | ICD-10-CM | POA: Diagnosis present

## 2021-02-12 DIAGNOSIS — E876 Hypokalemia: Secondary | ICD-10-CM | POA: Diagnosis present

## 2021-02-12 DIAGNOSIS — I10 Essential (primary) hypertension: Secondary | ICD-10-CM

## 2021-02-12 DIAGNOSIS — D649 Anemia, unspecified: Secondary | ICD-10-CM | POA: Diagnosis present

## 2021-02-12 DIAGNOSIS — J9601 Acute respiratory failure with hypoxia: Secondary | ICD-10-CM | POA: Diagnosis present

## 2021-02-12 DIAGNOSIS — R0902 Hypoxemia: Secondary | ICD-10-CM | POA: Diagnosis present

## 2021-02-12 DIAGNOSIS — R739 Hyperglycemia, unspecified: Secondary | ICD-10-CM | POA: Diagnosis present

## 2021-02-12 DIAGNOSIS — D72829 Elevated white blood cell count, unspecified: Secondary | ICD-10-CM | POA: Diagnosis present

## 2021-02-12 DIAGNOSIS — F17210 Nicotine dependence, cigarettes, uncomplicated: Secondary | ICD-10-CM | POA: Diagnosis present

## 2021-02-12 DIAGNOSIS — Z7951 Long term (current) use of inhaled steroids: Secondary | ICD-10-CM | POA: Diagnosis not present

## 2021-02-12 LAB — CBC WITH DIFFERENTIAL/PLATELET
Abs Immature Granulocytes: 0.03 10*3/uL (ref 0.00–0.07)
Basophils Absolute: 0.1 10*3/uL (ref 0.0–0.1)
Basophils Relative: 1 %
Eosinophils Absolute: 0 10*3/uL (ref 0.0–0.5)
Eosinophils Relative: 0 %
HCT: 42 % (ref 39.0–52.0)
Hemoglobin: 13.8 g/dL (ref 13.0–17.0)
Immature Granulocytes: 0 %
Lymphocytes Relative: 32 %
Lymphs Abs: 2.6 10*3/uL (ref 0.7–4.0)
MCH: 30 pg (ref 26.0–34.0)
MCHC: 32.9 g/dL (ref 30.0–36.0)
MCV: 91.3 fL (ref 80.0–100.0)
Monocytes Absolute: 0.8 10*3/uL (ref 0.1–1.0)
Monocytes Relative: 10 %
Neutro Abs: 4.5 10*3/uL (ref 1.7–7.7)
Neutrophils Relative %: 57 %
Platelets: 317 10*3/uL (ref 150–400)
RBC: 4.6 MIL/uL (ref 4.22–5.81)
RDW: 15.9 % — ABNORMAL HIGH (ref 11.5–15.5)
WBC: 7.9 10*3/uL (ref 4.0–10.5)
nRBC: 0 % (ref 0.0–0.2)

## 2021-02-12 LAB — CBG MONITORING, ED
Glucose-Capillary: 240 mg/dL — ABNORMAL HIGH (ref 70–99)
Glucose-Capillary: 200 mg/dL — ABNORMAL HIGH (ref 70–99)

## 2021-02-12 LAB — HEMOGLOBIN A1C
Hgb A1c MFr Bld: 5.7 % — ABNORMAL HIGH (ref 4.8–5.6)
Mean Plasma Glucose: 116.89 mg/dL

## 2021-02-12 LAB — CBC
HCT: 37.9 % — ABNORMAL LOW (ref 39.0–52.0)
Hemoglobin: 12.5 g/dL — ABNORMAL LOW (ref 13.0–17.0)
MCH: 30.1 pg (ref 26.0–34.0)
MCHC: 33 g/dL (ref 30.0–36.0)
MCV: 91.3 fL (ref 80.0–100.0)
Platelets: 234 10*3/uL (ref 150–400)
RBC: 4.15 MIL/uL — ABNORMAL LOW (ref 4.22–5.81)
RDW: 15.6 % — ABNORMAL HIGH (ref 11.5–15.5)
WBC: 7.5 10*3/uL (ref 4.0–10.5)
nRBC: 0 % (ref 0.0–0.2)

## 2021-02-12 LAB — BASIC METABOLIC PANEL
Anion gap: 9 (ref 5–15)
BUN: 10 mg/dL (ref 8–23)
CO2: 24 mmol/L (ref 22–32)
Calcium: 8.7 mg/dL — ABNORMAL LOW (ref 8.9–10.3)
Chloride: 103 mmol/L (ref 98–111)
Creatinine, Ser: 0.63 mg/dL (ref 0.61–1.24)
GFR, Estimated: >60 mL/min (ref >60)
Glucose, Bld: 225 mg/dL — ABNORMAL HIGH (ref 70–99)
Potassium: 3.5 mmol/L (ref 3.5–5.1)
Sodium: 136 mmol/L (ref 135–145)

## 2021-02-12 LAB — RESP PANEL BY RT-PCR (FLU A&B, COVID) ARPGX2
Influenza A by PCR: NEGATIVE
Influenza B by PCR: NEGATIVE
SARS Coronavirus 2 by RT PCR: NEGATIVE

## 2021-02-12 LAB — HIV ANTIBODY (ROUTINE TESTING W REFLEX): HIV Screen 4th Generation wRfx: NONREACTIVE

## 2021-02-12 MED ORDER — ESCITALOPRAM OXALATE 10 MG PO TABS
10.0000 mg | ORAL_TABLET | Freq: Every day | ORAL | Status: DC
  Administered 2021-02-12 – 2021-02-16 (×5): 10 mg via ORAL
  Filled 2021-02-12 (×5): qty 1

## 2021-02-12 MED ORDER — IPRATROPIUM-ALBUTEROL 0.5-2.5 (3) MG/3ML IN SOLN
3.0000 mL | Freq: Four times a day (QID) | RESPIRATORY_TRACT | Status: DC
  Administered 2021-02-12 (×3): 3 mL via RESPIRATORY_TRACT
  Filled 2021-02-12 (×3): qty 3

## 2021-02-12 MED ORDER — ONDANSETRON HCL 4 MG PO TABS: 4.0000 mg | ORAL_TABLET | Freq: Four times a day (QID) | ORAL | Status: DC | PRN

## 2021-02-12 MED ORDER — SENNOSIDES-DOCUSATE SODIUM 8.6-50 MG PO TABS: 1.0000 | ORAL_TABLET | Freq: Every evening | ORAL | Status: DC | PRN

## 2021-02-12 MED ORDER — AMLODIPINE BESYLATE 5 MG PO TABS
5.0000 mg | ORAL_TABLET | Freq: Every day | ORAL | Status: DC
  Administered 2021-02-12 – 2021-02-16 (×5): 5 mg via ORAL
  Filled 2021-02-12 (×5): qty 1

## 2021-02-12 MED ORDER — ACETAMINOPHEN 650 MG RE SUPP: 650.0000 mg | Freq: Four times a day (QID) | RECTAL | Status: DC | PRN

## 2021-02-12 MED ORDER — ACETAMINOPHEN 325 MG PO TABS
650.0000 mg | ORAL_TABLET | Freq: Four times a day (QID) | ORAL | Status: DC | PRN
  Administered 2021-02-12: 650 mg via ORAL
  Filled 2021-02-12: qty 2

## 2021-02-12 MED ORDER — ALBUTEROL SULFATE (2.5 MG/3ML) 0.083% IN NEBU: 2.5000 mg | INHALATION_SOLUTION | RESPIRATORY_TRACT | Status: DC | PRN

## 2021-02-12 MED ORDER — CEFTRIAXONE SODIUM 1 G IJ SOLR
1.0000 g | INTRAMUSCULAR | Status: DC
  Administered 2021-02-12: 1 g via INTRAVENOUS
  Filled 2021-02-12: qty 10

## 2021-02-12 MED ORDER — ENOXAPARIN SODIUM 40 MG/0.4ML IJ SOSY
40.0000 mg | PREFILLED_SYRINGE | INTRAMUSCULAR | Status: DC
  Administered 2021-02-13 – 2021-02-16 (×4): 40 mg via SUBCUTANEOUS
  Filled 2021-02-12 (×5): qty 0.4

## 2021-02-12 MED ORDER — MOMETASONE FURO-FORMOTEROL FUM 100-5 MCG/ACT IN AERO: 2.0000 | INHALATION_SPRAY | Freq: Two times a day (BID) | RESPIRATORY_TRACT | Status: DC

## 2021-02-12 MED ORDER — ALBUTEROL SULFATE HFA 108 (90 BASE) MCG/ACT IN AERS
2.0000 | INHALATION_SPRAY | RESPIRATORY_TRACT | Status: DC | PRN
  Administered 2021-02-12: 2 via RESPIRATORY_TRACT
  Filled 2021-02-12: qty 6.7

## 2021-02-12 MED ORDER — INSULIN ASPART 100 UNIT/ML IJ SOLN
0.0000 [IU] | Freq: Three times a day (TID) | INTRAMUSCULAR | Status: DC
  Administered 2021-02-12: 1 [IU] via SUBCUTANEOUS
  Administered 2021-02-12: 2 [IU] via SUBCUTANEOUS
  Administered 2021-02-14 – 2021-02-15 (×2): 1 [IU] via SUBCUTANEOUS
  Filled 2021-02-12: qty 0.06

## 2021-02-12 MED ORDER — AEROCHAMBER PLUS FLO-VU MISC
1.0000 | Freq: Once | Status: DC
  Filled 2021-02-12: qty 1

## 2021-02-12 MED ORDER — METHYLPREDNISOLONE SODIUM SUCC 125 MG IJ SOLR
125.0000 mg | Freq: Two times a day (BID) | INTRAMUSCULAR | Status: DC
  Administered 2021-02-12 – 2021-02-13 (×3): 125 mg via INTRAVENOUS
  Filled 2021-02-12 (×3): qty 2

## 2021-02-12 MED ORDER — MOMETASONE FURO-FORMOTEROL FUM 200-5 MCG/ACT IN AERO
2.0000 | INHALATION_SPRAY | Freq: Two times a day (BID) | RESPIRATORY_TRACT | Status: DC
  Administered 2021-02-12 – 2021-02-16 (×9): 2 via RESPIRATORY_TRACT
  Filled 2021-02-12: qty 8.8

## 2021-02-12 MED ORDER — ONDANSETRON HCL 4 MG/2ML IJ SOLN: 4.0000 mg | Freq: Four times a day (QID) | INTRAMUSCULAR | Status: DC | PRN

## 2021-02-12 MED ORDER — SODIUM CHLORIDE 0.9 % IV SOLN
500.0000 mg | INTRAVENOUS | Status: DC
  Administered 2021-02-12 – 2021-02-14 (×3): 500 mg via INTRAVENOUS
  Filled 2021-02-12 (×3): qty 500

## 2021-02-12 MED ORDER — PREDNISONE 20 MG PO TABS: 40.0000 mg | ORAL_TABLET | Freq: Every day | ORAL | Status: DC

## 2021-02-12 MED ORDER — METHYLPREDNISOLONE SODIUM SUCC 125 MG IJ SOLR
125.0000 mg | Freq: Two times a day (BID) | INTRAMUSCULAR | Status: DC
Start: 2021-02-12 — End: 2021-02-12

## 2021-02-12 NOTE — Progress Notes (Signed)
Patient taken off BIPAP per verbal order from ED physician. Patient placed on 2L [place]

## 2021-02-12 NOTE — H&P (Signed)
History and Physical    Lynx Goodrich DGU:[id number] DOB: 08/12/1955 DOA: 02/11/2021  PCP: Gerlene Fee, DO   Patient coming from: Home   Chief Complaint: SOB   HPI: Gavin Kane is a 65 y.o. male with medical history significant for hypertension, anxiety, COPD, and current smoker, now presenting to the emergency department for evaluation of shortness of breath.  The patient reports that he developed exertional dyspnea 02/10/2021, was becoming dyspneic with minimal exertion and even at rest by the afternoon, and then last night was having a lot of difficulty catching his breath at rest.  Cough has been worse in the morning with some scant sputum production.  He denies chest pain or leg swelling.  Continues to smoke 1/2 pack per day, is trying to cut back.  EMS was called, found the patient to be in distress, and administered IV Solu-Medrol, IV magnesium, epinephrine, 2 duo nebs, and 15 L/min of supplemental oxygen prior to arrival in the ED.  ED Course: Upon arrival to the ED, patient is found to be afebrile, saturating mid to upper 90s on BiPAP, tachypneic into the 30s, tachycardic, and with stable blood pressure.  Chest x-ray is negative for acute cardiopulmonary disease.  Chemistry panel notable for glucose 202.  CBC unremarkable.  Patient was started on BiPAP in the emergency department and given 10 mg albuterol treatment.  Review of Systems:  All other systems reviewed and apart from HPI, are negative.  Past Medical History:  Diagnosis Date   Anxiety    COPD (chronic obstructive pulmonary disease) (HCC)    HTN (hypertension)     Past Surgical History:  Procedure Laterality Date   HEMORRHOID SURGERY      Social History:   reports that he has been smoking cigarettes. He started smoking about 50 years ago. He has a 24.00 pack-year smoking history. He has quit using smokeless tobacco. He reports current alcohol use. He reports that he does not use drugs.  No Known  Allergies  Family History  Problem Relation Age of Onset   Cancer Other        Aunt   Colon cancer Neg Hx    Colon polyps Neg Hx    Rectal cancer Neg Hx    Stomach cancer Neg Hx      Prior to Admission medications   Medication Sig Start Date End Date Taking? Authorizing Provider  albuterol (PROVENTIL) (2.5 MG/3ML) 0.083% nebulizer solution TAKE 3 MLS(2.5 MG TOTAL) BY NEBULIZATION EVERY 4 HOURS AS NEEDED FOR WHEEZING OR SHORTNESS OF BREATH 11/23/20  Yes Autry-Lott, Naaman Plummer, DO  amLODipine (NORVASC) 5 MG tablet TAKE 1 TABLET(5 MG) BY MOUTH DAILY 11/13/20  Yes Autry-Lott, Simone, DO  escitalopram (LEXAPRO) 10 MG tablet TAKE 1 TABLET(10 MG) BY MOUTH DAILY 01/22/21  Yes Autry-Lott, Naaman Plummer, DO  tiotropium (SPIRIVA HANDIHALER) 18 MCG inhalation capsule Use on capsule with 2 oral inhalations once per day Patient taking differently: Place 18 mcg into inhaler and inhale daily. Use on capsule with 2 oral inhalations once per day 04/16/19  Yes Nuala Alpha, MD  DULERA 200-5 MCG/ACT AERO INHALE 2 PUFFS INTO THE LUNGS TWICE DAILY 11/13/20   Autry-Lott, Naaman Plummer, DO  ipratropium (ATROVENT) 0.06 % nasal spray Place 2 sprays into both nostrils 4 (four) times daily. Patient not taking: Reported on 06/03/2019 11/23/18 06/22/19  Ok Edwards, PA-C    Physical Exam: Vitals:   02/12/21 0200 02/12/21 0230 02/12/21 0300 02/12/21 0441  BP: (!) 162/95 (!) 167/122 (!) 151/92 (!) 144/88  Pulse: (!) 111 (!) 119 (!) 118 (!) 112  Resp: 19 19 (!) 25 20  Temp:      TempSrc:      SpO2: 96% 93% 96% 92%  Weight:      Height:         Constitutional: no pallor, no diaphoresis  Eyes: PERTLA, lids and conjunctivae normal ENMT: Mucous membranes are moist. Posterior pharynx clear of any exudate or lesions.   Neck: supple, no masses  Respiratory: Dyspneic with speech. Labored respirations. Diminished breath sounds bilaterally, prolonged expiratory phase, end-expiratory wheeze.  Cardiovascular: Rate ~100 and regular. No  extremity edema.  Abdomen: No distension, no tenderness, soft. Bowel sounds active.  Musculoskeletal: no clubbing / cyanosis. No joint deformity upper and lower extremities.   Skin: no significant rashes, lesions, ulcers. Warm, dry, well-perfused. Neurologic: CN 2-12 grossly intact. Moving all extremities. Alert, oriented.  Psychiatric: Pleasant. Cooperative.    Labs and Imaging on Admission: I have personally reviewed following labs and imaging studies  CBC: Recent Labs  Lab 02/11/21 2304  WBC 7.9  NEUTROABS 4.5  HGB 13.8  HCT 42.0  MCV 91.3  PLT 366   Basic Metabolic Panel: Recent Labs  Lab 02/11/21 2304  NA 139  K 5.1  CL 104  CO2 25  GLUCOSE 202*  BUN 14  CREATININE 0.84  CALCIUM 8.6*   GFR: Estimated Creatinine Clearance: 75.2 mL/min (by C-G formula based on SCr of 0.84 mg/dL). Liver Function Tests: No results for input(s): AST, ALT, ALKPHOS, BILITOT, PROT, ALBUMIN in the last 168 hours. No results for input(s): LIPASE, AMYLASE in the last 168 hours. No results for input(s): AMMONIA in the last 168 hours. Coagulation Profile: No results for input(s): INR, PROTIME in the last 168 hours. Cardiac Enzymes: No results for input(s): CKTOTAL, CKMB, CKMBINDEX, TROPONINI in the last 168 hours. BNP (last 3 results) No results for input(s): PROBNP in the last 8760 hours. HbA1C: No results for input(s): HGBA1C in the last 72 hours. CBG: No results for input(s): GLUCAP in the last 168 hours. Lipid Profile: No results for input(s): CHOL, HDL, LDLCALC, TRIG, CHOLHDL, LDLDIRECT in the last 72 hours. Thyroid Function Tests: No results for input(s): TSH, T4TOTAL, FREET4, T3FREE, THYROIDAB in the last 72 hours. Anemia Panel: No results for input(s): VITAMINB12, FOLATE, FERRITIN, TIBC, IRON, RETICCTPCT in the last 72 hours. Urine analysis:    Component Value Date/Time   COLORURINE YELLOW 06/03/2019 1502   APPEARANCEUR CLEAR 06/03/2019 1502   LABSPEC 1.023 06/03/2019 1502    PHURINE 7.0 06/03/2019 1502   GLUCOSEU NEGATIVE 06/03/2019 1502   HGBUR NEGATIVE 06/03/2019 1502   BILIRUBINUR NEGATIVE 06/03/2019 1502   KETONESUR NEGATIVE 06/03/2019 1502   PROTEINUR NEGATIVE 06/03/2019 1502   UROBILINOGEN 2.0 (H) 08/16/2014 1624   NITRITE NEGATIVE 06/03/2019 1502   LEUKOCYTESUR NEGATIVE 06/03/2019 1502   Sepsis Labs: @LABRCNTIP (procalcitonin:4,lacticidven:4) )No results found for this or any previous visit (from the past 240 hour(s)).   Radiological Exams on Admission: DG Chest Port 1 View  Result Date: 02/12/2021 CLINICAL DATA:  Shortness of breath beginning 24 hours ago. EXAM: PORTABLE CHEST 1 VIEW COMPARISON:  09/18/2020 FINDINGS: The heart size and mediastinal contours are within normal limits. Both lungs are clear. The visualized skeletal structures are unremarkable. IMPRESSION: No active disease. Electronically Signed   By: Lucienne Capers M.D.   On: 02/12/2021 00:20     Assessment/Plan   1. COPD exacerbation  - Presents with 1-2 days of SOB, in acute distress initially, treated  with IV Solu-Medrol and nebs with EMS, BiPAP and continuous albuterol in ED  - Check sputum culture, continue systemic steroid, start antibiotic, schedule SAMA/SABA, additional SABA as needed    2. Hypertension  - Continue Norvasc    3. Anxiety  - Continue Lexapro   4. Hyperglycemia  - Serum glucose 202 in ED without hx of DM  - Likely related to steroids with EMS and/or stress response   - Check A1c, monitor, use low-intensity SSI if needed    5. Current smoker  - Continues to smoke 1/2 ppd, trying to cut back but having trouble  - Continue cessation discussions    DVT prophylaxis: Lovenox Code Status: Full  Level of Care: Level of care: Stepdown Family Communication: None present  Disposition Plan:  Patient is from: Home  Anticipated d/c is to: Home  Anticipated d/c date is: 10/12 or 02/15/21 Patient currently: Pending improved/stable respiratory status   Consults called: none  Admission status: Inpatient     Vianne Bulls, MD [facility]  02/12/2021, 5:45 AM

## 2021-02-12 NOTE — Progress Notes (Signed)
Patient ID: Gavin Kane, male   DOB: May 24, 1955, 65 y.o.   MRN: [id number] Patient admitted early this morning for worsening shortness of breath secondary to COPD exacerbation and started on BiPAP and IV steroids.  Patient seen and examined at bedside and plan of care discussed with him.  I have reviewed patient's medical records including this morning's H&P, current vitals, labs and medications myself.  Continue systemic steroids along with nebs.  Wean off BiPAP as able.

## 2021-02-13 LAB — CBC
HCT: 35.3 % — ABNORMAL LOW (ref 39.0–52.0)
Hemoglobin: 11.6 g/dL — ABNORMAL LOW (ref 13.0–17.0)
MCH: 29.5 pg (ref 26.0–34.0)
MCHC: 32.9 g/dL (ref 30.0–36.0)
MCV: 89.8 fL (ref 80.0–100.0)
Platelets: 230 10*3/uL (ref 150–400)
RBC: 3.93 MIL/uL — ABNORMAL LOW (ref 4.22–5.81)
RDW: 15.2 % (ref 11.5–15.5)
WBC: 9.8 10*3/uL (ref 4.0–10.5)
nRBC: 0.2 % (ref 0.0–0.2)

## 2021-02-13 LAB — GLUCOSE, CAPILLARY: Glucose-Capillary: 214 mg/dL — ABNORMAL HIGH (ref 70–99)

## 2021-02-13 LAB — BASIC METABOLIC PANEL
Anion gap: 8 (ref 5–15)
BUN: 10 mg/dL (ref 8–23)
CO2: 25 mmol/L (ref 22–32)
Calcium: 8.6 mg/dL — ABNORMAL LOW (ref 8.9–10.3)
Chloride: 104 mmol/L (ref 98–111)
Creatinine, Ser: 0.63 mg/dL (ref 0.61–1.24)
GFR, Estimated: >60 mL/min (ref >60)
Glucose, Bld: 181 mg/dL — ABNORMAL HIGH (ref 70–99)
Potassium: 3.4 mmol/L — ABNORMAL LOW (ref 3.5–5.1)
Sodium: 137 mmol/L (ref 135–145)

## 2021-02-13 LAB — CBG MONITORING, ED
Glucose-Capillary: 183 mg/dL — ABNORMAL HIGH (ref 70–99)
Glucose-Capillary: 157 mg/dL — ABNORMAL HIGH (ref 70–99)

## 2021-02-13 LAB — MAGNESIUM: Magnesium: 1.9 mg/dL (ref 1.7–2.4)

## 2021-02-13 MED ORDER — POTASSIUM CHLORIDE CRYS ER 20 MEQ PO TBCR
40.0000 meq | EXTENDED_RELEASE_TABLET | Freq: Once | ORAL | Status: AC
  Administered 2021-02-13: 40 meq via ORAL
  Filled 2021-02-13: qty 2

## 2021-02-13 MED ORDER — IPRATROPIUM-ALBUTEROL 0.5-2.5 (3) MG/3ML IN SOLN
3.0000 mL | Freq: Three times a day (TID) | RESPIRATORY_TRACT | Status: DC
  Administered 2021-02-13 – 2021-02-15 (×6): 3 mL via RESPIRATORY_TRACT
  Filled 2021-02-13 (×7): qty 3

## 2021-02-13 MED ORDER — METHYLPREDNISOLONE SODIUM SUCC 125 MG IJ SOLR
125.0000 mg | Freq: Every day | INTRAMUSCULAR | Status: DC
  Administered 2021-02-14: 125 mg via INTRAVENOUS
  Filled 2021-02-13: qty 2

## 2021-02-13 NOTE — Progress Notes (Signed)
Patient ID: Gavin Kane, male   DOB: 08-15-1955, 65 y.o.   MRN: [id number]  PROGRESS NOTE    Kealii Thueson  UDJ:[id number] DOB: [birth year] DOA: 02/11/2021 PCP: Gerlene Fee, DO   Brief Narrative:  65 y.o. male with medical history significant for hypertension, anxiety, COPD, and current smoker presented with worsening shortness of breath.  On presentation, patient was extremely hypoxic and tachypneic requiring BiPAP in the ED.  Chest x-ray was negative for acute cardiopulmonary disease.  He was started on IV Solu-Medrol.  Assessment & Plan:   COPD exacerbation Acute respiratory failure with hypoxia Tobacco abuse, ongoing -Counseled regarding cessation. -Required BiPAP on presentation.  Chest x-ray was negative for acute cardiopulmonary disease.  COVID-19 and influenza testing negative. -Currently on 2 L oxygen via nasal cannula.  Wean off as able. -Decrease Solu-Medrol to 60 mg IV every 12 hours.  Continue nebs and Dulera.  Outpatient follow-up with pulmonary.  Continue Zithromax. -Incentive spirometry/flutter valve  Hypertension -Continue amlodipine  Anxiety -Continue Lexapro  Hyperglycemia -A1c 5.7.  Possibly aggravated by steroids.  Continue CBGs with SSI   Hypokalemia -Replace  Normocytic anemia -Questionable cause.  Hemoglobin stable    DVT prophylaxis: Lovenox Code Status: Full Family Communication: None at bedside Disposition Plan: Status is: Inpatient  Remains inpatient appropriate because:Inpatient level of care appropriate due to severity of illness  Dispo: The patient is from: Home              Anticipated d/c is to: Home              Patient currently is not medically stable to d/c.   Difficult to place patient No   Consultants: None  Procedures: None  Antimicrobials:  Anti-infectives (From admission, onward)    Start     Dose/Rate Route Frequency Ordered Stop   02/12/21 1030  azithromycin (ZITHROMAX) 500 mg in sodium chloride 0.9 % 250 mL  IVPB        500 mg 250 mL/hr over 60 Minutes Intravenous Every 24 hours 02/12/21 1021     02/12/21 0500  cefTRIAXone (ROCEPHIN) 1 g in sodium chloride 0.9 % 100 mL IVPB  Status:  Discontinued        1 g 200 mL/hr over 30 Minutes Intravenous Every 24 hours 02/12/21 0453 02/12/21 1021        Subjective: Patient seen and examined at bedside.  He feels slightly better but still feels short of breath with exertion and feels intermittently anxious.  No fever, abdominal pain or vomiting.  Still has intermittent cough.  Objective: Vitals:   02/13/21 0400 02/13/21 0615 02/13/21 0805 02/13/21 0952  BP: 126/81 139/90  136/86  Pulse: 79 74  96  Resp: 18 18    Temp:      TempSrc:      SpO2: 96% 93% 97% 94%  Weight:      Height:       No intake or output data in the 24 hours ending 02/13/21 1031 Filed Weights   02/11/21 2301 02/11/21 2305  Weight: 63.5 kg 63.5 kg    Examination:  General exam: Appears calm and comfortable.  Currently on 2 L oxygen via nasal cannula Respiratory system: Bilateral decreased breath sounds at bases with some scattered wheezing Cardiovascular system: S1 & S2 heard, Rate controlled Gastrointestinal system: Abdomen is nondistended, soft and nontender. Normal bowel sounds heard. Extremities: No cyanosis, clubbing, edema  Central nervous system: Alert and oriented. No focal neurological deficits. Moving extremities Skin: No rashes, lesions  or ulcers Psychiatry: Intermittently gets anxious.   Data Reviewed: I have personally reviewed following labs and imaging studies  CBC: Recent Labs  Lab 02/11/21 2304 02/12/21 0803 02/13/21 0511  WBC 7.9 7.5 9.8  NEUTROABS 4.5  --   --   HGB 13.8 12.5* 11.6*  HCT 42.0 37.9* 35.3*  MCV 91.3 91.3 89.8  PLT 317 234 185   Basic Metabolic Panel: Recent Labs  Lab 02/11/21 2304 02/12/21 0803 02/13/21 0511  NA 139 136 137  K 5.1 3.5 3.4*  CL 104 103 104  CO2 25 24 25   GLUCOSE 202* 225* 181*  BUN 14 10 10    CREATININE 0.84 0.63 0.63  CALCIUM 8.6* 8.7* 8.6*  MG  --   --  1.9   GFR: Estimated Creatinine Clearance: 79 mL/min (by C-G formula based on SCr of 0.63 mg/dL). Liver Function Tests: No results for input(s): AST, ALT, ALKPHOS, BILITOT, PROT, ALBUMIN in the last 168 hours. No results for input(s): LIPASE, AMYLASE in the last 168 hours. No results for input(s): AMMONIA in the last 168 hours. Coagulation Profile: No results for input(s): INR, PROTIME in the last 168 hours. Cardiac Enzymes: No results for input(s): CKTOTAL, CKMB, CKMBINDEX, TROPONINI in the last 168 hours. BNP (last 3 results) No results for input(s): PROBNP in the last 8760 hours. HbA1C: Recent Labs    02/12/21 0803  HGBA1C 5.7*   CBG: Recent Labs  Lab 02/12/21 0814 02/12/21 1203  GLUCAP 240* 200*   Lipid Profile: No results for input(s): CHOL, HDL, LDLCALC, TRIG, CHOLHDL, LDLDIRECT in the last 72 hours. Thyroid Function Tests: No results for input(s): TSH, T4TOTAL, FREET4, T3FREE, THYROIDAB in the last 72 hours. Anemia Panel: No results for input(s): VITAMINB12, FOLATE, FERRITIN, TIBC, IRON, RETICCTPCT in the last 72 hours. Sepsis Labs: No results for input(s): PROCALCITON, LATICACIDVEN in the last 168 hours.  Recent Results (from the past 240 hour(s))  Resp Panel by RT-PCR (Flu A&B, Covid) Nasopharyngeal Swab     Status: None   Collection Time: 02/12/21  5:20 AM   Specimen: Nasopharyngeal Swab; Nasopharyngeal(NP) swabs in vial transport medium  Result Value Ref Range Status   SARS Coronavirus 2 by RT PCR NEGATIVE NEGATIVE Final    Comment: (NOTE) SARS-CoV-2 target nucleic acids are NOT DETECTED.  The SARS-CoV-2 RNA is generally detectable in upper respiratory specimens during the acute phase of infection. The lowest concentration of SARS-CoV-2 viral copies this assay can detect is 138 copies/mL. A negative result does not preclude SARS-Cov-2 infection and should not be used as the sole basis for  treatment or other patient management decisions. A negative result may occur with  improper specimen collection/handling, submission of specimen other than nasopharyngeal swab, presence of viral mutation(s) within the areas targeted by this assay, and inadequate number of viral copies(<138 copies/mL). A negative result must be combined with clinical observations, patient history, and epidemiological information. The expected result is Negative.  Fact Sheet for Patients:  EntrepreneurPulse.com.au  Fact Sheet for Healthcare Providers:  IncredibleEmployment.be  This test is no t yet approved or cleared by the [place] FDA and  has been authorized for detection and/or diagnosis of SARS-CoV-2 by FDA under an Emergency Use Authorization (EUA). This EUA will remain  in effect (meaning this test can be used) for the duration of the COVID-19 declaration under Section 564(b)(1) of the Act, 21 U.S.C.section 360bbb-3(b)(1), unless the authorization is terminated  or revoked sooner.       Influenza A by PCR NEGATIVE  NEGATIVE Final   Influenza B by PCR NEGATIVE NEGATIVE Final    Comment: (NOTE) The Xpert Xpress SARS-CoV-2/FLU/RSV plus assay is intended as an aid in the diagnosis of influenza from Nasopharyngeal swab specimens and should not be used as a sole basis for treatment. Nasal washings and aspirates are unacceptable for Xpert Xpress SARS-CoV-2/FLU/RSV testing.  Fact Sheet for Patients: EntrepreneurPulse.com.au  Fact Sheet for Healthcare Providers: IncredibleEmployment.be  This test is not yet approved or cleared by the [place] FDA and has been authorized for detection and/or diagnosis of SARS-CoV-2 by FDA under an Emergency Use Authorization (EUA). This EUA will remain in effect (meaning this test can be used) for the duration of the COVID-19 declaration under Section 564(b)(1) of the Act, 21  U.S.C. section 360bbb-3(b)(1), unless the authorization is terminated or revoked.  Performed at [facility], [place] [address] [place], [place] [address]          Radiology Studies: [facility] Chest Port 1 View  Result Date: 02/12/2021 CLINICAL DATA:  Shortness of breath beginning 24 hours ago. EXAM: PORTABLE CHEST 1 VIEW COMPARISON:  09/18/2020 FINDINGS: The heart size and mediastinal contours are within normal limits. Both lungs are clear. The visualized skeletal structures are unremarkable. IMPRESSION: No active disease. Electronically Signed   By: Lucienne Capers M.D.   On: 02/12/2021 00:20        Scheduled Meds:  amLODipine  5 mg Oral Daily   enoxaparin (LOVENOX) injection  40 mg Subcutaneous Q24H   escitalopram  10 mg Oral Daily   insulin aspart  0-6 Units Subcutaneous TID WC   ipratropium-albuterol  3 mL Nebulization TID   methylPREDNISolone (SOLU-MEDROL) injection  125 mg Intravenous Q12H   mometasone-formoterol  2 puff Inhalation BID   Continuous Infusions:  azithromycin 500 mg (02/13/21 0931)          Aline August, MD [facility] 02/13/2021, 10:31 AM

## 2021-02-14 LAB — BASIC METABOLIC PANEL
Anion gap: 7 (ref 5–15)
BUN: 15 mg/dL (ref 8–23)
CO2: 26 mmol/L (ref 22–32)
Calcium: 9 mg/dL (ref 8.9–10.3)
Chloride: 107 mmol/L (ref 98–111)
Creatinine, Ser: 0.68 mg/dL (ref 0.61–1.24)
GFR, Estimated: >60 mL/min (ref >60)
Glucose, Bld: 134 mg/dL — ABNORMAL HIGH (ref 70–99)
Potassium: 3.6 mmol/L (ref 3.5–5.1)
Sodium: 140 mmol/L (ref 135–145)

## 2021-02-14 LAB — CBC
HCT: 35.4 % — ABNORMAL LOW (ref 39.0–52.0)
Hemoglobin: 11.9 g/dL — ABNORMAL LOW (ref 13.0–17.0)
MCH: 30.7 pg (ref 26.0–34.0)
MCHC: 33.6 g/dL (ref 30.0–36.0)
MCV: 91.5 fL (ref 80.0–100.0)
Platelets: 218 10*3/uL (ref 150–400)
RBC: 3.87 MIL/uL — ABNORMAL LOW (ref 4.22–5.81)
RDW: 15.8 % — ABNORMAL HIGH (ref 11.5–15.5)
WBC: 12.5 10*3/uL — ABNORMAL HIGH (ref 4.0–10.5)
nRBC: 0.2 % (ref 0.0–0.2)

## 2021-02-14 LAB — GLUCOSE, CAPILLARY
Glucose-Capillary: 118 mg/dL — ABNORMAL HIGH (ref 70–99)
Glucose-Capillary: 144 mg/dL — ABNORMAL HIGH (ref 70–99)
Glucose-Capillary: 163 mg/dL — ABNORMAL HIGH (ref 70–99)

## 2021-02-14 LAB — MAGNESIUM: Magnesium: 1.9 mg/dL (ref 1.7–2.4)

## 2021-02-14 MED ORDER — AZITHROMYCIN 250 MG PO TABS
500.0000 mg | ORAL_TABLET | Freq: Every day | ORAL | Status: DC
  Administered 2021-02-15 – 2021-02-16 (×2): 500 mg via ORAL
  Filled 2021-02-14 (×2): qty 2

## 2021-02-14 MED ORDER — LOPERAMIDE HCL 2 MG PO CAPS
2.0000 mg | ORAL_CAPSULE | Freq: Four times a day (QID) | ORAL | Status: DC | PRN
  Administered 2021-02-14 (×2): 2 mg via ORAL
  Filled 2021-02-14 (×2): qty 1

## 2021-02-14 NOTE — Progress Notes (Signed)
PHARMACIST - PHYSICIAN COMMUNICATION DR:   Starla Link CONCERNING: Antibiotic IV to Oral Route Change Policy  RECOMMENDATION: This patient is receiving Azithromycin by the intravenous route.  Based on criteria approved by the Pharmacy and Therapeutics Committee, the antibiotic(s) is/are being converted to the equivalent oral dose form(s).  Minda Ditto PharmD [facility] [phone] 02/14/2021, 10:50 AM    DESCRIPTION: These criteria include: Patient being treated for a respiratory tract infection, urinary tract infection, cellulitis or clostridium difficile associated diarrhea if on metronidazole The patient is not neutropenic and does not exhibit a GI malabsorption state The patient is eating (either orally or via tube) and/or has been taking other orally administered medications for a least 24 hours The patient is improving clinically and has a Tmax < 100.5  If you have questions about this conversion, please contact the Pharmacy Department  []   [phone] )  [facility] []   [phone] )  [facility] []   [phone] )  [facility] []   [phone] )  [facility] [x]   [phone] )  [facility]

## 2021-02-14 NOTE — Plan of Care (Signed)
  Problem: Education: Goal: Knowledge of disease or condition will improve Outcome: Progressing Goal: Knowledge of the prescribed therapeutic regimen will improve Outcome: Progressing Goal: Individualized Educational Video(s) Outcome: Not Applicable   Problem: Activity: Goal: Ability to tolerate increased activity will improve Outcome: Progressing Goal: Will verbalize the importance of balancing activity with adequate rest periods Outcome: Progressing   Problem: Respiratory: Goal: Ability to maintain a clear airway will improve Outcome: Progressing Goal: Levels of oxygenation will improve Outcome: Progressing Goal: Ability to maintain adequate ventilation will improve Outcome: Progressing

## 2021-02-14 NOTE — Progress Notes (Signed)
Patient ID: Gavin Kane, male   DOB: 12-04-1955, 65 y.o.   MRN: [id number]  PROGRESS NOTE    Gavin Kane  KDX:[id number] DOB: 06-24-1955 DOA: 02/11/2021 PCP: Gerlene Fee, DO   Brief Narrative:  65 y.o. male with medical history significant for hypertension, anxiety, COPD, and current smoker presented with worsening shortness of breath.  On presentation, patient was extremely hypoxic and tachypneic requiring BiPAP in the ED.  Chest x-ray was negative for acute cardiopulmonary disease.  He was started on IV Solu-Medrol.  Assessment & Plan:   COPD exacerbation Acute respiratory failure with hypoxia Tobacco abuse, ongoing -Counseled regarding cessation. -Required BiPAP on presentation.  Chest x-ray was negative for acute cardiopulmonary disease.  COVID-19 and influenza testing negative. -Respiratory status improving.  Currently on room air.  Still intermittently tachypneic. -Currently on Solu-Medrol 125 mg IV daily.  Possible switch to oral prednisone in the next 1 to 2 days.  Continue nebs and Dulera.  Outpatient follow-up with pulmonary.  Continue Zithromax. -Incentive spirometry/flutter valve  Hypertension -Continue amlodipine  Anxiety -Continue Lexapro  Hyperglycemia -A1c 5.7.  Possibly aggravated by steroids.  Continue CBGs with SSI   Hypokalemia -Resolved  Leukocytosis -Possibly reactive.  Normocytic anemia -Questionable cause.  Hemoglobin stable    DVT prophylaxis: Lovenox Code Status: Full Family Communication: None at bedside Disposition Plan: Status is: Inpatient  Remains inpatient appropriate because:Inpatient level of care appropriate due to severity of illness  Dispo: The patient is from: Home              Anticipated d/c is to: Home              Patient currently is not medically stable to d/c.   Difficult to place patient No   Consultants: None  Procedures: None  Antimicrobials:  Anti-infectives (From admission, onward)    Start      Dose/Rate Route Frequency Ordered Stop   02/12/21 1030  azithromycin (ZITHROMAX) 500 mg in sodium chloride 0.9 % 250 mL IVPB        500 mg 250 mL/hr over 60 Minutes Intravenous Every 24 hours 02/12/21 1021     02/12/21 0500  cefTRIAXone (ROCEPHIN) 1 g in sodium chloride 0.9 % 100 mL IVPB  Status:  Discontinued        1 g 200 mL/hr over 30 Minutes Intravenous Every 24 hours 02/12/21 0453 02/12/21 1021        Subjective: Patient seen and examined at bedside.  Denies worsening fever, nausea, vomiting or chest pain.  Still short of breath with mild exertion. Objective: Vitals:   02/13/21 1918 02/13/21 2015 02/14/21 0016 02/14/21 0403  BP:  (!) 144/81 (!) 144/94 (!) 113/99  Pulse:  96 74 84  Resp:  (!) 22 (!) 22 (!) 22  Temp:  98.3 F (36.8 C) 98.2 F (36.8 C) 98.4 F (36.9 C)  TempSrc:  Oral Oral Oral  SpO2: 99% 99% 97% 97%  Weight:  62.4 kg  62.5 kg  Height:  5\' 5"  (1.651 m)      Intake/Output Summary (Last 24 hours) at 02/14/2021 0727 Last data filed at 02/14/2021 0500 Gross per 24 hour  Intake 1060.86 ml  Output --  Net 1060.86 ml   Filed Weights   02/11/21 2305 02/13/21 2015 02/14/21 0403  Weight: 63.5 kg 62.4 kg 62.5 kg    Examination:  General exam: No acute distress.  On room air currently.   Respiratory system: Decreased breath sounds at bases bilaterally with mild wheezing; intermittent  tachypnea present. cardiovascular system: Rate controlled, S1-S2 heard gastrointestinal system: Abdomen is mildly distended, soft and nontender.  Bowel sounds are heard  extremities: No clubbing or edema   Data Reviewed: I have personally reviewed following labs and imaging studies  CBC: Recent Labs  Lab 02/11/21 2304 02/12/21 0803 02/13/21 0511 02/14/21 0400  WBC 7.9 7.5 9.8 12.5*  NEUTROABS 4.5  --   --   --   HGB 13.8 12.5* 11.6* 11.9*  HCT 42.0 37.9* 35.3* 35.4*  MCV 91.3 91.3 89.8 91.5  PLT 317 234 230 809    Basic Metabolic Panel: Recent Labs  Lab  02/11/21 2304 02/12/21 0803 02/13/21 0511 02/14/21 0400  NA 139 136 137 140  K 5.1 3.5 3.4* 3.6  CL 104 103 104 107  CO2 25 24 25 26   GLUCOSE 202* 225* 181* 134*  BUN 14 10 10 15   CREATININE 0.84 0.63 0.63 0.68  CALCIUM 8.6* 8.7* 8.6* 9.0  MG  --   --  1.9 1.9    GFR: Estimated Creatinine Clearance: 79 mL/min (by C-G formula based on SCr of 0.68 mg/dL). Liver Function Tests: No results for input(s): AST, ALT, ALKPHOS, BILITOT, PROT, ALBUMIN in the last 168 hours. No results for input(s): LIPASE, AMYLASE in the last 168 hours. No results for input(s): AMMONIA in the last 168 hours. Coagulation Profile: No results for input(s): INR, PROTIME in the last 168 hours. Cardiac Enzymes: No results for input(s): CKTOTAL, CKMB, CKMBINDEX, TROPONINI in the last 168 hours. BNP (last 3 results) No results for input(s): PROBNP in the last 8760 hours. HbA1C: Recent Labs    02/12/21 0803  HGBA1C 5.7*    CBG: Recent Labs  Lab 02/12/21 0814 02/12/21 1203 02/13/21 1149 02/13/21 1732 02/13/21 2157  GLUCAP 240* 200* 183* 157* 214*    Lipid Profile: No results for input(s): CHOL, HDL, LDLCALC, TRIG, CHOLHDL, LDLDIRECT in the last 72 hours. Thyroid Function Tests: No results for input(s): TSH, T4TOTAL, FREET4, T3FREE, THYROIDAB in the last 72 hours. Anemia Panel: No results for input(s): VITAMINB12, FOLATE, FERRITIN, TIBC, IRON, RETICCTPCT in the last 72 hours. Sepsis Labs: No results for input(s): PROCALCITON, LATICACIDVEN in the last 168 hours.  Recent Results (from the past 240 hour(s))  Resp Panel by RT-PCR (Flu A&B, Covid) Nasopharyngeal Swab     Status: None   Collection Time: 02/12/21  5:20 AM   Specimen: Nasopharyngeal Swab; Nasopharyngeal(NP) swabs in vial transport medium  Result Value Ref Range Status   SARS Coronavirus 2 by RT PCR NEGATIVE NEGATIVE Final    Comment: (NOTE) SARS-CoV-2 target nucleic acids are NOT DETECTED.  The SARS-CoV-2 RNA is generally detectable  in upper respiratory specimens during the acute phase of infection. The lowest concentration of SARS-CoV-2 viral copies this assay can detect is 138 copies/mL. A negative result does not preclude SARS-Cov-2 infection and should not be used as the sole basis for treatment or other patient management decisions. A negative result may occur with  improper specimen collection/handling, submission of specimen other than nasopharyngeal swab, presence of viral mutation(s) within the areas targeted by this assay, and inadequate number of viral copies(<138 copies/mL). A negative result must be combined with clinical observations, patient history, and epidemiological information. The expected result is Negative.  Fact Sheet for Patients:  EntrepreneurPulse.com.au  Fact Sheet for Healthcare Providers:  IncredibleEmployment.be  This test is no t yet approved or cleared by the [place] FDA and  has been authorized for detection and/or diagnosis of SARS-CoV-2 by  FDA under an Emergency Use Authorization (EUA). This EUA will remain  in effect (meaning this test can be used) for the duration of the COVID-19 declaration under Section 564(b)(1) of the Act, 21 U.S.C.section 360bbb-3(b)(1), unless the authorization is terminated  or revoked sooner.       Influenza A by PCR NEGATIVE NEGATIVE Final   Influenza B by PCR NEGATIVE NEGATIVE Final    Comment: (NOTE) The Xpert Xpress SARS-CoV-2/FLU/RSV plus assay is intended as an aid in the diagnosis of influenza from Nasopharyngeal swab specimens and should not be used as a sole basis for treatment. Nasal washings and aspirates are unacceptable for Xpert Xpress SARS-CoV-2/FLU/RSV testing.  Fact Sheet for Patients: EntrepreneurPulse.com.au  Fact Sheet for Healthcare Providers: IncredibleEmployment.be  This test is not yet approved or cleared by the [place] FDA and has  been authorized for detection and/or diagnosis of SARS-CoV-2 by FDA under an Emergency Use Authorization (EUA). This EUA will remain in effect (meaning this test can be used) for the duration of the COVID-19 declaration under Section 564(b)(1) of the Act, 21 U.S.C. section 360bbb-3(b)(1), unless the authorization is terminated or revoked.  Performed at [facility], [place] [address], [place], [place] [address]           Radiology Studies: No results found.      Scheduled Meds:  amLODipine  5 mg Oral Daily   enoxaparin (LOVENOX) injection  40 mg Subcutaneous Q24H   escitalopram  10 mg Oral Daily   insulin aspart  0-6 Units Subcutaneous TID WC   ipratropium-albuterol  3 mL Nebulization TID   methylPREDNISolone (SOLU-MEDROL) injection  125 mg Intravenous Daily   mometasone-formoterol  2 puff Inhalation BID   Continuous Infusions:  azithromycin Stopped (02/13/21 1031)          Aline August, MD [facility] 02/14/2021, 7:27 AM

## 2021-02-15 LAB — GLUCOSE, CAPILLARY
Glucose-Capillary: 101 mg/dL — ABNORMAL HIGH (ref 70–99)
Glucose-Capillary: 136 mg/dL — ABNORMAL HIGH (ref 70–99)
Glucose-Capillary: 170 mg/dL — ABNORMAL HIGH (ref 70–99)

## 2021-02-15 MED ORDER — IPRATROPIUM-ALBUTEROL 0.5-2.5 (3) MG/3ML IN SOLN
3.0000 mL | Freq: Two times a day (BID) | RESPIRATORY_TRACT | Status: DC
  Administered 2021-02-15 – 2021-02-16 (×2): 3 mL via RESPIRATORY_TRACT
  Filled 2021-02-15 (×2): qty 3

## 2021-02-15 MED ORDER — METHYLPREDNISOLONE SODIUM SUCC 125 MG IJ SOLR
60.0000 mg | Freq: Every day | INTRAMUSCULAR | Status: DC
  Administered 2021-02-15 – 2021-02-16 (×2): 60 mg via INTRAVENOUS
  Filled 2021-02-15 (×2): qty 2

## 2021-02-15 NOTE — Progress Notes (Signed)
Patient ID: Gavin Kane, male   DOB: Jan 04, 1956, 65 y.o.   MRN: [id number]  PROGRESS NOTE    Gavin Kane  [id number] DOB: 1955-07-02 DOA: 02/11/2021 PCP: Gerlene Fee, DO   Brief Narrative:  65 y.o. male with medical history significant for hypertension, anxiety, COPD, and current smoker presented with worsening shortness of breath.  On presentation, patient was extremely hypoxic and tachypneic requiring BiPAP in the ED.  Chest x-ray was negative for acute cardiopulmonary disease.  He was started on IV Solu-Medrol.  Assessment & Plan:   COPD exacerbation Acute respiratory failure with hypoxia Tobacco abuse, ongoing -Counseled regarding cessation. -Required BiPAP on presentation.  Chest x-ray was negative for acute cardiopulmonary disease.  COVID-19 and influenza testing negative. -Respiratory status improving.  Currently on room air.   -Currently on Solu-Medrol 125 mg IV daily.  Patient does not feel well enough to go home today.  Switch Solu-Medrol to 60 mg IV daily.  Possibly switch to oral prednisone tomorrow.  Continue nebs and Dulera.  Outpatient follow-up with pulmonary.  Continue Zithromax. -Incentive spirometry/flutter valve  Hypertension -Continue amlodipine  Anxiety -Continue Lexapro  Hyperglycemia -A1c 5.7.  Possibly aggravated by steroids.  Continue CBGs with SSI   Hypokalemia -Resolved  Leukocytosis -Possibly reactive.  Normocytic anemia -Questionable cause.  Hemoglobin stable    DVT prophylaxis: Lovenox Code Status: Full Family Communication: None at bedside Disposition Plan: Status is: Inpatient  Remains inpatient appropriate because:Inpatient level of care appropriate due to severity of illness  Dispo: The patient is from: Home              Anticipated d/c is to: Home              Patient currently is not medically stable to d/c.   Difficult to place patient No   Consultants: None  Procedures: None  Antimicrobials:  Anti-infectives  (From admission, onward)    Start     Dose/Rate Route Frequency Ordered Stop   02/15/21 1000  azithromycin (ZITHROMAX) tablet 500 mg        500 mg Oral Daily 02/14/21 1048     02/12/21 1030  azithromycin (ZITHROMAX) 500 mg in sodium chloride 0.9 % 250 mL IVPB  Status:  Discontinued        500 mg 250 mL/hr over 60 Minutes Intravenous Every 24 hours 02/12/21 1021 02/14/21 1047   02/12/21 0500  cefTRIAXone (ROCEPHIN) 1 g in sodium chloride 0.9 % 100 mL IVPB  Status:  Discontinued        1 g 200 mL/hr over 30 Minutes Intravenous Every 24 hours 02/12/21 0453 02/12/21 1021        Subjective: Patient seen and examined at bedside.  Feels very weak and tired and short of breath with minimal exertion; does not feel ready to go home today.  Still intermittently coughing.  No overnight fever, chest pain, vomiting reported. Objective: Vitals:   02/14/21 2127 02/15/21 0611 02/15/21 0815 02/15/21 0817  BP: 130/84 130/85    Pulse: 81 69    Resp: 20 20    Temp: 98.6 F (37 C) 97.7 F (36.5 C)    TempSrc: Oral Oral    SpO2: 98% 97% 97% 97%  Weight:      Height:        Intake/Output Summary (Last 24 hours) at 02/15/2021 0949 Last data filed at 02/15/2021 0611 Gross per 24 hour  Intake 493.47 ml  Output --  Net 493.47 ml    [employer]  02/11/21 2305 02/13/21 2015 02/14/21 0403  Weight: 63.5 kg 62.4 kg 62.5 kg    Examination:  General exam: Currently on room air.  No distress.  Respiratory system: Bilateral decreased breath sounds at bases with some intermittent wheezing  cardiovascular system: S1-S2 heard, rate controlled  gastrointestinal system: Abdomen is distended slightly, soft and nontender.  Normal bowel sounds heard  extremities: No edema or cyanosis or clubbing  Data Reviewed: I have personally reviewed following labs and imaging studies  CBC: Recent Labs  Lab 02/11/21 2304 02/12/21 0803 02/13/21 0511 02/14/21 0400  WBC 7.9 7.5 9.8 12.5*  NEUTROABS 4.5  --   --    --   HGB 13.8 12.5* 11.6* 11.9*  HCT 42.0 37.9* 35.3* 35.4*  MCV 91.3 91.3 89.8 91.5  PLT 317 234 230 811    Basic Metabolic Panel: Recent Labs  Lab 02/11/21 2304 02/12/21 0803 02/13/21 0511 02/14/21 0400  NA 139 136 137 140  K 5.1 3.5 3.4* 3.6  CL 104 103 104 107  CO2 25 24 25 26   GLUCOSE 202* 225* 181* 134*  BUN 14 10 10 15   CREATININE 0.84 0.63 0.63 0.68  CALCIUM 8.6* 8.7* 8.6* 9.0  MG  --   --  1.9 1.9    GFR: Estimated Creatinine Clearance: 79 mL/min (by C-G formula based on SCr of 0.68 mg/dL). Liver Function Tests: No results for input(s): AST, ALT, ALKPHOS, BILITOT, PROT, ALBUMIN in the last 168 hours. No results for input(s): LIPASE, AMYLASE in the last 168 hours. No results for input(s): AMMONIA in the last 168 hours. Coagulation Profile: No results for input(s): INR, PROTIME in the last 168 hours. Cardiac Enzymes: No results for input(s): CKTOTAL, CKMB, CKMBINDEX, TROPONINI in the last 168 hours. BNP (last 3 results) No results for input(s): PROBNP in the last 8760 hours. HbA1C: No results for input(s): HGBA1C in the last 72 hours.  CBG: Recent Labs  Lab 02/13/21 1732 02/13/21 2157 02/14/21 0952 02/14/21 1121 02/14/21 1628  GLUCAP 157* 214* 118* 144* 163*    Lipid Profile: No results for input(s): CHOL, HDL, LDLCALC, TRIG, CHOLHDL, LDLDIRECT in the last 72 hours. Thyroid Function Tests: No results for input(s): TSH, T4TOTAL, FREET4, T3FREE, THYROIDAB in the last 72 hours. Anemia Panel: No results for input(s): VITAMINB12, FOLATE, FERRITIN, TIBC, IRON, RETICCTPCT in the last 72 hours. Sepsis Labs: No results for input(s): PROCALCITON, LATICACIDVEN in the last 168 hours.  Recent Results (from the past 240 hour(s))  Resp Panel by RT-PCR (Flu A&B, Covid) Nasopharyngeal Swab     Status: None   Collection Time: 02/12/21  5:20 AM   Specimen: Nasopharyngeal Swab; Nasopharyngeal(NP) swabs in vial transport medium  Result Value Ref Range Status    SARS Coronavirus 2 by RT PCR NEGATIVE NEGATIVE Final    Comment: (NOTE) SARS-CoV-2 target nucleic acids are NOT DETECTED.  The SARS-CoV-2 RNA is generally detectable in upper respiratory specimens during the acute phase of infection. The lowest concentration of SARS-CoV-2 viral copies this assay can detect is 138 copies/mL. A negative result does not preclude SARS-Cov-2 infection and should not be used as the sole basis for treatment or other patient management decisions. A negative result may occur with  improper specimen collection/handling, submission of specimen other than nasopharyngeal swab, presence of viral mutation(s) within the areas targeted by this assay, and inadequate number of viral copies(<138 copies/mL). A negative result must be combined with clinical observations, patient history, and epidemiological information. The expected result is Negative.  Fact  Sheet for Patients:  EntrepreneurPulse.com.au  Fact Sheet for Healthcare Providers:  IncredibleEmployment.be  This test is no t yet approved or cleared by the [place] FDA and  has been authorized for detection and/or diagnosis of SARS-CoV-2 by FDA under an Emergency Use Authorization (EUA). This EUA will remain  in effect (meaning this test can be used) for the duration of the COVID-19 declaration under Section 564(b)(1) of the Act, 21 U.S.C.section 360bbb-3(b)(1), unless the authorization is terminated  or revoked sooner.       Influenza A by PCR NEGATIVE NEGATIVE Final   Influenza B by PCR NEGATIVE NEGATIVE Final    Comment: (NOTE) The Xpert Xpress SARS-CoV-2/FLU/RSV plus assay is intended as an aid in the diagnosis of influenza from Nasopharyngeal swab specimens and should not be used as a sole basis for treatment. Nasal washings and aspirates are unacceptable for Xpert Xpress SARS-CoV-2/FLU/RSV testing.  Fact Sheet for  Patients: EntrepreneurPulse.com.au  Fact Sheet for Healthcare Providers: IncredibleEmployment.be  This test is not yet approved or cleared by the [place] FDA and has been authorized for detection and/or diagnosis of SARS-CoV-2 by FDA under an Emergency Use Authorization (EUA). This EUA will remain in effect (meaning this test can be used) for the duration of the COVID-19 declaration under Section 564(b)(1) of the Act, 21 U.S.C. section 360bbb-3(b)(1), unless the authorization is terminated or revoked.  Performed at [facility], [place] [address] [place], [place] [address]           Radiology Studies: No results found.      Scheduled Meds:  amLODipine  5 mg Oral Daily   azithromycin  500 mg Oral Daily   enoxaparin (LOVENOX) injection  40 mg Subcutaneous Q24H   escitalopram  10 mg Oral Daily   insulin aspart  0-6 Units Subcutaneous TID WC   ipratropium-albuterol  3 mL Nebulization TID   methylPREDNISolone (SOLU-MEDROL) injection  125 mg Intravenous Daily   mometasone-formoterol  2 puff Inhalation BID   Continuous Infusions:          Aline August, MD [facility] 02/15/2021, 9:49 AM

## 2021-02-16 LAB — GLUCOSE, CAPILLARY
Glucose-Capillary: 88 mg/dL (ref 70–99)
Glucose-Capillary: 112 mg/dL — ABNORMAL HIGH (ref 70–99)

## 2021-02-16 NOTE — Care Management Important Message (Signed)
Important Message  Patient Details IM Letter given to the Patient. Name: Gavin Kane MRN: [id number] Date of Birth: 25-Oct-1955   Medicare Important Message Given:  Yes     [name] 02/16/2021, 11:22 AM

## 2021-02-16 NOTE — Progress Notes (Signed)
Gavin Kane asleep resting well tonight. He continues to have shortness of breath and fatigue with simple activity, but verbalizes feeling much better than he did on admission.

## 2021-02-16 NOTE — Discharge Summary (Signed)
Physician Discharge Summary  Gavin Kane ZES:[id number] DOB: Nov 24, 1955 DOA: 02/11/2021  PCP: Gerlene Fee, DO  Admit date: 02/11/2021 Discharge date: 02/16/2021  Admitted From: Home Disposition: Home  Recommendations for Outpatient Follow-up:  Follow up with PCP in 1 week Please obtain CBC in one week Please follow up on the following pending results: None  Home Health: None Equipment/Devices: None  Discharge Condition: Stable CODE STATUS: Full code Diet recommendation: Regular diet   Brief/Interim Summary:  Admission HPI written by Aline August, MD   HPI: Gavin Kane is a 65 y.o. male with medical history significant for hypertension, anxiety, COPD, and current smoker, now presenting to the emergency department for evaluation of shortness of breath.  The patient reports that he developed exertional dyspnea 02/10/2021, was becoming dyspneic with minimal exertion and even at rest by the afternoon, and then last night was having a lot of difficulty catching his breath at rest.  Cough has been worse in the morning with some scant sputum production.  He denies chest pain or leg swelling.  Continues to smoke 1/2 pack per day, is trying to cut back.  EMS was called, found the patient to be in distress, and administered IV Solu-Medrol, IV magnesium, epinephrine, 2 duo nebs, and 15 L/min of supplemental oxygen prior to arrival in the ED.   Hospital course:  COPD exacerbation Patient initially required BiPAP in the ED on admission for tachypnea/respiratory distress. Patient treated with solu-medrol IV, azithromycin and albuterol nebulizer treatments. No recurrent development of oxygen requirement. Patient ambulated without need for supplemental oxygen on discharge. Completed azithromycin and steroids prior to discharge. Continue albuterol as needed, Dulera and Spiriva as an outpatient.  Primary hypertension Continue amlodipine  Anxiety Continue  Lexapro  Hyperglycemia Hemoglobin A1C of 5.7%. Hyperglycemia likely secondary to steroids.  Hypokalemia Supplementation given with resolution.  Leukocytosis Likely secondary to steroids. Repeat CBC as an outpatient. No evidence of infection. Afebrile.  Normocytic anemia Mild. Will need outpatient follow-up.  Discharge Diagnoses:  Principal Problem:   COPD exacerbation ([place]) Active Problems:   Hypertension   Anxiety   Hyperglycemia    Discharge Instructions   Allergies as of 02/16/2021   No Known Allergies      Medication List     STOP taking these medications    dextromethorphan-guaiFENesin 30-600 MG 12hr tablet Commonly known as: [facility] DM       TAKE these medications    albuterol (2.5 MG/3ML) 0.083% nebulizer solution Commonly known as: PROVENTIL TAKE 3 MLS(2.5 MG TOTAL) BY NEBULIZATION EVERY 4 HOURS AS NEEDED FOR WHEEZING OR SHORTNESS OF BREATH What changed: Another medication with the same name was removed. Continue taking this medication, and follow the directions you see here.   amLODipine 5 MG tablet Commonly known as: NORVASC TAKE 1 TABLET(5 MG) BY MOUTH DAILY   Dulera 200-5 MCG/ACT Aero Generic drug: mometasone-formoterol INHALE 2 PUFFS INTO THE LUNGS TWICE DAILY What changed: Another medication with the same name was removed. Continue taking this medication, and follow the directions you see here.   escitalopram 10 MG tablet Commonly known as: LEXAPRO TAKE 1 TABLET(10 MG) BY MOUTH DAILY   Spiriva HandiHaler 18 MCG inhalation capsule Generic drug: tiotropium Use on capsule with 2 oral inhalations once per day What changed:  how much to take how to take this when to take this        Follow-up Information     Autry-Lott, [place], DO. Schedule an appointment as soon as possible for a visit  in 1 week(s).   Specialty: Family Medicine Why: For hospital follow-up Contact information: [address]  [address] [phone]                No Known Allergies  Consultations: None   Procedures/Studies: DG Chest Port 1 View  Result Date: 02/12/2021 CLINICAL DATA:  Shortness of breath beginning 24 hours ago. EXAM: PORTABLE CHEST 1 VIEW COMPARISON:  09/18/2020 FINDINGS: The heart size and mediastinal contours are within normal limits. Both lungs are clear. The visualized skeletal structures are unremarkable. IMPRESSION: No active disease. Electronically Signed   By: Lucienne Capers M.D.   On: 02/12/2021 00:20       Subjective: No dyspnea or chest pain today.  Discharge Exam: Vitals:   02/16/21 0807 02/16/21 0809  BP:    Pulse:    Resp:    Temp:    SpO2: 96% 96%   Vitals:   02/16/21 0435 02/16/21 0440 02/16/21 0807 02/16/21 0809  BP:  137/81    Pulse:  78    Resp:  19    Temp:  98 F (36.7 C)    TempSrc:  Oral    SpO2:  100% 96% 96%  Weight: 64.7 kg     Height:        General exam: Appears calm and comfortable  Respiratory system: Clear to auscultation. Respiratory effort normal. Cardiovascular system: S1 & S2 heard, RRR. No murmurs, rubs, gallops or clicks. Gastrointestinal system: Abdomen is nondistended, soft and nontender. No organomegaly or masses felt. Normal bowel sounds heard. Central nervous system: Alert and oriented. No focal neurological deficits. Musculoskeletal: No edema. No calf tenderness Skin: No cyanosis. No rashes Psychiatry: Judgement and insight appear normal. Mood & affect appropriate.     The results of significant diagnostics from this hospitalization (including imaging, microbiology, ancillary and laboratory) are listed below for reference.     Microbiology: Recent Results (from the past 240 hour(s))  Resp Panel by RT-PCR (Flu A&B, Covid) Nasopharyngeal Swab     Status: None   Collection Time: 02/12/21  5:20 AM   Specimen: Nasopharyngeal Swab; Nasopharyngeal(NP) swabs in vial transport medium  Result Value Ref Range Status    SARS Coronavirus 2 by RT PCR NEGATIVE NEGATIVE Final    Comment: (NOTE) SARS-CoV-2 target nucleic acids are NOT DETECTED.  The SARS-CoV-2 RNA is generally detectable in upper respiratory specimens during the acute phase of infection. The lowest concentration of SARS-CoV-2 viral copies this assay can detect is 138 copies/mL. A negative result does not preclude SARS-Cov-2 infection and should not be used as the sole basis for treatment or other patient management decisions. A negative result may occur with  improper specimen collection/handling, submission of specimen other than nasopharyngeal swab, presence of viral mutation(s) within the areas targeted by this assay, and inadequate number of viral copies(<138 copies/mL). A negative result must be combined with clinical observations, patient history, and epidemiological information. The expected result is Negative.  Fact Sheet for Patients:  EntrepreneurPulse.com.au  Fact Sheet for Healthcare Providers:  IncredibleEmployment.be  This test is no t yet approved or cleared by the [place] FDA and  has been authorized for detection and/or diagnosis of SARS-CoV-2 by FDA under an Emergency Use Authorization (EUA). This EUA will remain  in effect (meaning this test can be used) for the duration of the COVID-19 declaration under Section 564(b)(1) of the Act, 21 U.S.C.section 360bbb-3(b)(1), unless the authorization is terminated  or revoked sooner.  Influenza A by PCR NEGATIVE NEGATIVE Final   Influenza B by PCR NEGATIVE NEGATIVE Final    Comment: (NOTE) The Xpert Xpress SARS-CoV-2/FLU/RSV plus assay is intended as an aid in the diagnosis of influenza from Nasopharyngeal swab specimens and should not be used as a sole basis for treatment. Nasal washings and aspirates are unacceptable for Xpert Xpress SARS-CoV-2/FLU/RSV testing.  Fact Sheet for  Patients: EntrepreneurPulse.com.au  Fact Sheet for Healthcare Providers: IncredibleEmployment.be  This test is not yet approved or cleared by the [place] FDA and has been authorized for detection and/or diagnosis of SARS-CoV-2 by FDA under an Emergency Use Authorization (EUA). This EUA will remain in effect (meaning this test can be used) for the duration of the COVID-19 declaration under Section 564(b)(1) of the Act, 21 U.S.C. section 360bbb-3(b)(1), unless the authorization is terminated or revoked.  Performed at [facility], [place] [address], [place], [place] [address]      Labs: BNP (last 3 results) No results for input(s): BNP in the last 8760 hours. Basic Metabolic Panel: Recent Labs  Lab 02/11/21 2304 02/12/21 0803 02/13/21 0511 02/14/21 0400  NA 139 136 137 140  K 5.1 3.5 3.4* 3.6  CL 104 103 104 107  CO2 25 24 25 26   GLUCOSE 202* 225* 181* 134*  BUN 14 10 10 15   CREATININE 0.84 0.63 0.63 0.68  CALCIUM 8.6* 8.7* 8.6* 9.0  MG  --   --  1.9 1.9   Liver Function Tests: No results for input(s): AST, ALT, ALKPHOS, BILITOT, PROT, ALBUMIN in the last 168 hours. No results for input(s): LIPASE, AMYLASE in the last 168 hours. No results for input(s): AMMONIA in the last 168 hours. CBC: Recent Labs  Lab 02/11/21 2304 02/12/21 0803 02/13/21 0511 02/14/21 0400  WBC 7.9 7.5 9.8 12.5*  NEUTROABS 4.5  --   --   --   HGB 13.8 12.5* 11.6* 11.9*  HCT 42.0 37.9* 35.3* 35.4*  MCV 91.3 91.3 89.8 91.5  PLT 317 234 230 218   Cardiac Enzymes: No results for input(s): CKTOTAL, CKMB, CKMBINDEX, TROPONINI in the last 168 hours. BNP: Invalid input(s): POCBNP CBG: Recent Labs  Lab 02/14/21 1628 02/15/21 1046 02/15/21 1156 02/15/21 1722 02/16/21 0737  GLUCAP 163* 101* 136* 170* 88   D-Dimer No results for input(s): DDIMER in the last 72 hours. Hgb A1c No results for input(s): HGBA1C in the last 72  hours. Lipid Profile No results for input(s): CHOL, HDL, LDLCALC, TRIG, CHOLHDL, LDLDIRECT in the last 72 hours. Thyroid function studies No results for input(s): TSH, T4TOTAL, T3FREE, THYROIDAB in the last 72 hours.  Invalid input(s): FREET3 Anemia work up No results for input(s): VITAMINB12, FOLATE, FERRITIN, TIBC, IRON, RETICCTPCT in the last 72 hours. Urinalysis    Component Value Date/Time   COLORURINE YELLOW 06/03/2019 1502   APPEARANCEUR CLEAR 06/03/2019 1502   LABSPEC 1.023 06/03/2019 1502   PHURINE 7.0 06/03/2019 1502   GLUCOSEU NEGATIVE 06/03/2019 1502   HGBUR NEGATIVE 06/03/2019 1502   BILIRUBINUR NEGATIVE 06/03/2019 1502   KETONESUR NEGATIVE 06/03/2019 1502   PROTEINUR NEGATIVE 06/03/2019 1502   UROBILINOGEN 2.0 (H) 08/16/2014 1624   NITRITE NEGATIVE 06/03/2019 1502   LEUKOCYTESUR NEGATIVE 06/03/2019 1502   Sepsis Labs Invalid input(s): PROCALCITONIN,  WBC,  LACTICIDVEN Microbiology Recent Results (from the past 240 hour(s))  Resp Panel by RT-PCR (Flu A&B, Covid) Nasopharyngeal Swab     Status: None   Collection Time: 02/12/21  5:20 AM   Specimen: Nasopharyngeal Swab;  Nasopharyngeal(NP) swabs in vial transport medium  Result Value Ref Range Status   SARS Coronavirus 2 by RT PCR NEGATIVE NEGATIVE Final    Comment: (NOTE) SARS-CoV-2 target nucleic acids are NOT DETECTED.  The SARS-CoV-2 RNA is generally detectable in upper respiratory specimens during the acute phase of infection. The lowest concentration of SARS-CoV-2 viral copies this assay can detect is 138 copies/mL. A negative result does not preclude SARS-Cov-2 infection and should not be used as the sole basis for treatment or other patient management decisions. A negative result may occur with  improper specimen collection/handling, submission of specimen other than nasopharyngeal swab, presence of viral mutation(s) within the areas targeted by this assay, and inadequate number of viral copies(<138  copies/mL). A negative result must be combined with clinical observations, patient history, and epidemiological information. The expected result is Negative.  Fact Sheet for Patients:  EntrepreneurPulse.com.au  Fact Sheet for Healthcare Providers:  IncredibleEmployment.be  This test is no t yet approved or cleared by the [place] FDA and  has been authorized for detection and/or diagnosis of SARS-CoV-2 by FDA under an Emergency Use Authorization (EUA). This EUA will remain  in effect (meaning this test can be used) for the duration of the COVID-19 declaration under Section 564(b)(1) of the Act, 21 U.S.C.section 360bbb-3(b)(1), unless the authorization is terminated  or revoked sooner.       Influenza A by PCR NEGATIVE NEGATIVE Final   Influenza B by PCR NEGATIVE NEGATIVE Final    Comment: (NOTE) The Xpert Xpress SARS-CoV-2/FLU/RSV plus assay is intended as an aid in the diagnosis of influenza from Nasopharyngeal swab specimens and should not be used as a sole basis for treatment. Nasal washings and aspirates are unacceptable for Xpert Xpress SARS-CoV-2/FLU/RSV testing.  Fact Sheet for Patients: EntrepreneurPulse.com.au  Fact Sheet for Healthcare Providers: IncredibleEmployment.be  This test is not yet approved or cleared by the [place] FDA and has been authorized for detection and/or diagnosis of SARS-CoV-2 by FDA under an Emergency Use Authorization (EUA). This EUA will remain in effect (meaning this test can be used) for the duration of the COVID-19 declaration under Section 564(b)(1) of the Act, 21 U.S.C. section 360bbb-3(b)(1), unless the authorization is terminated or revoked.  Performed at [facility], [place] [address], [place], [place] [address]      Time coordinating discharge: 35 minutes  SIGNED:   Cordelia Poche, MD [facility] 02/16/2021, 11:01  AM

## 2021-02-16 NOTE — Discharge Instructions (Signed)
Gavin Kane,  You were in the hospital with a COPD exacerbation and have improved with steroids and albuterol. Please continue your albuterol as needed but you no longed need steroids. Please follow-up with your primary care physician.

## 2021-02-16 NOTE — Progress Notes (Signed)
SATURATION QUALIFICATIONS: (This note is used to comply with regulatory documentation for home oxygen)  Patient Saturations on Room Air at Rest = 94%  Patient Saturations on Room Air while Ambulating = 97%  Patient Saturations after Ambulating = 96%  Please briefly explain why patient needs home oxygen: Does not

## 2021-02-20 ENCOUNTER — Other Ambulatory Visit: Payer: Self-pay | Admitting: Family Medicine

## 2021-02-21 ENCOUNTER — Encounter: Payer: Self-pay | Admitting: Family Medicine

## 2021-02-21 ENCOUNTER — Other Ambulatory Visit: Payer: Self-pay

## 2021-02-21 ENCOUNTER — Ambulatory Visit (INDEPENDENT_AMBULATORY_CARE_PROVIDER_SITE_OTHER): Payer: Medicare Other | Admitting: Family Medicine

## 2021-02-21 VITALS — BP 128/85 | HR 99 | Wt 131.0 lb

## 2021-02-21 DIAGNOSIS — Z23 Encounter for immunization: Secondary | ICD-10-CM | POA: Diagnosis not present

## 2021-02-21 DIAGNOSIS — D72829 Elevated white blood cell count, unspecified: Secondary | ICD-10-CM | POA: Insufficient documentation

## 2021-02-21 DIAGNOSIS — Z720 Tobacco use: Secondary | ICD-10-CM

## 2021-02-21 DIAGNOSIS — J449 Chronic obstructive pulmonary disease, unspecified: Secondary | ICD-10-CM

## 2021-02-21 MED ORDER — ALBUTEROL SULFATE (2.5 MG/3ML) 0.083% IN NEBU: 2.5000 mg | INHALATION_SOLUTION | RESPIRATORY_TRACT | 1 refills | Status: DC | PRN

## 2021-02-21 NOTE — Progress Notes (Signed)
    SUBJECTIVE:   CHIEF COMPLAINT / HPI:   Admitted 10/9-10/14/22 for COPD exacerbation, initially requiring BiPAP in the ED. Completed azithromycin, steroid course in hospital. Chest X-ray without  acute abnormality. No oxygen needed at discharge. PFTs in 11/26/2018 with Dr Valentina Lucks revealed severe COPD GOLD group D. Is taking Dulera and Spiriva as outpatient.  Has not been using his albuterol nebulizer as he did not have any refills.  Per chart review has had three exacerbations in past year. Had leukocytosis thought to be due to steroid use and recommended repeating CBC outpatient.  Denies fevers, chills, chest pain, shortness of breath, palpitations, leg swelling, cough.  He notes he is breathing they have unable to get up and down his steps.  Tobacco use-he is still smoking 1/2 pack/day, he states he has done this for the past 45 years.  He has never quit before.  He notes that he likes to smoke when he is eating and drinking, and thus he is "just too stubborn" to quit.  He would like to quit though, he notes there is no one that smokes around him at home.  PERTINENT  PMH / PSH: HTN, anxiety, COPD, tobacco use  OBJECTIVE:   BP 128/85   Pulse 99   Wt 131 lb (59.4 kg)   SpO2 92%   BMI 21.80 kg/m   General: Speaking in complete sentences Respiratory-mild tachypnea, decreased breath sounds at the bases bilaterally, no crackles or wheezing, no nasal flaring, no retractions Cardiac: Regular rate and rhythm, no murmur, distant heart sounds Extremities: No edema  ASSESSMENT/PLAN:   COPD without exacerbation (HCC) - No signs or symptoms of exacerbation today, patient given his flu shot and COVID booster - Albuterol refilled, discussed with patient if he is using more than twice a week to call as we may need to change his inhalers. - Discussed most important thing he can do to prevent future exacerbations is to quit smoking, see below  Leukocytosis - Noted in hospital, CBC recommended  follow-up, patient declines blood work today, consider at future visit  Tobacco use - Patient in contemplative phase, states he has medications at home but does not feel ready to quit - Discussed this is the most important thing he can do for his health, discussed we are willing to work with him anytime if he wants more accountability - Discussed considering something else that he might enjoy such as sucking on a piece of candy in the times that he is smoking, discussed talking with friends and family at home for more accountability    COVID booster given today Flu vaccine given today  Follow-up appointment in 2 to 3 months, discuss low-dose lung CT screening as patient is still smoking at follow-up.  Lenoria Chime, MD [address]

## 2021-02-21 NOTE — Patient Instructions (Addendum)
It was wonderful to see you today.  Please bring ALL of your medications with you to every visit.   Today we talked about:  -Today we gave you your flu vaccine and your COVID booster - I have refilled your albuterol nebulizer, if you are using this more than 2 times per week please give [place] a call so we can reassess your inhaler control - We will repeat your lab work today to check on your elevated white blood cell count I will call you if it is abnormal and send you a letter if that is normal -Please schedule follow-up to see her PCP in 2 to 3 months to reassess your COPD or call us sooner if you are having worsening symptoms   Thank you for choosing [facility].   Please call [phone] with any questions about today's appointment.  Please be sure to schedule follow up at the front  desk before you leave today.   Yehuda Savannah, MD  Family Medicine

## 2021-02-23 DIAGNOSIS — Z720 Tobacco use: Secondary | ICD-10-CM | POA: Insufficient documentation

## 2021-02-23 NOTE — Assessment & Plan Note (Signed)
-   Noted in hospital, CBC recommended follow-up, patient declines blood work today, consider at future visit

## 2021-02-23 NOTE — Assessment & Plan Note (Signed)
-   Patient in contemplative phase, states he has medications at home but does not feel ready to quit - Discussed this is the most important thing he can do for his health, discussed we are willing to work with him anytime if he wants more accountability - Discussed considering something else that he might enjoy such as sucking on a piece of candy in the times that he is smoking, discussed talking with friends and family at home for more accountability

## 2021-02-23 NOTE — Assessment & Plan Note (Signed)
-   No signs or symptoms of exacerbation today, patient given his flu shot and COVID booster - Albuterol refilled, discussed with patient if he is using more than twice a week to call as we may need to change his inhalers. - Discussed most important thing he can do to prevent future exacerbations is to quit smoking, see below

## 2021-03-05 ENCOUNTER — Other Ambulatory Visit: Payer: Self-pay | Admitting: Family Medicine

## 2021-03-07 MED ORDER — DULERA 200-5 MCG/ACT IN AERO: 2.0000 | INHALATION_SPRAY | Freq: Two times a day (BID) | RESPIRATORY_TRACT | 3 refills | Status: DC

## 2021-03-07 NOTE — Telephone Encounter (Signed)
Patient presents to clinic requesting to speak with nurse regarding rx refills. Patient is asking why medication is not ready at pharmacy. Advised patient that we did not receive request from pharmacy until after hours on Monday evening (10/31). Request was then routed to PCP on 11/1. Advised patient of 80-16 rx refill policy.   Will forward to PCP.   Talbot Grumbling, RN

## 2021-03-08 ENCOUNTER — Other Ambulatory Visit: Payer: Self-pay | Admitting: Family Medicine

## 2021-03-08 DIAGNOSIS — F411 Generalized anxiety disorder: Secondary | ICD-10-CM

## 2021-04-20 ENCOUNTER — Telehealth: Payer: Self-pay

## 2021-04-20 DIAGNOSIS — J449 Chronic obstructive pulmonary disease, unspecified: Secondary | ICD-10-CM

## 2021-04-20 MED ORDER — ALBUTEROL SULFATE HFA 108 (90 BASE) MCG/ACT IN AERS: 2.0000 | INHALATION_SPRAY | RESPIRATORY_TRACT | 0 refills | Status: DC | PRN

## 2021-04-20 NOTE — Telephone Encounter (Signed)
Pharmacy calls nurse line regarding albuterol inhaler. They are requesting verification on instructions, as patient reports taking inhaler prn. Pharmacist is requesting updated instructions if this is how patient is to be taking.   Requested the following directions. Inhale 2 puffs every 4-6 hours as needed.   Please advise if change is appropriate. If so, please send in new rx with this update.   Thanks.   Talbot Grumbling, RN

## 2021-05-28 ENCOUNTER — Other Ambulatory Visit: Payer: Self-pay | Admitting: Family Medicine

## 2021-06-01 ENCOUNTER — Other Ambulatory Visit: Payer: Self-pay | Admitting: Family Medicine

## 2021-06-01 DIAGNOSIS — J449 Chronic obstructive pulmonary disease, unspecified: Secondary | ICD-10-CM

## 2021-08-09 ENCOUNTER — Other Ambulatory Visit: Payer: Self-pay | Admitting: Family Medicine

## 2021-08-09 DIAGNOSIS — J449 Chronic obstructive pulmonary disease, unspecified: Secondary | ICD-10-CM

## 2021-08-10 ENCOUNTER — Other Ambulatory Visit: Payer: Self-pay | Admitting: Family Medicine

## 2021-08-13 ENCOUNTER — Other Ambulatory Visit: Payer: Self-pay | Admitting: Family Medicine

## 2021-08-13 DIAGNOSIS — J449 Chronic obstructive pulmonary disease, unspecified: Secondary | ICD-10-CM

## 2021-08-27 ENCOUNTER — Observation Stay (HOSPITAL_COMMUNITY)
Admission: EM | Admit: 2021-08-27 | Discharge: 2021-08-28 | Disposition: A | Discharge status: Home / Self Care | Payer: Medicare Other | Attending: Internal Medicine | Admitting: Internal Medicine

## 2021-08-27 ENCOUNTER — Emergency Department (HOSPITAL_COMMUNITY): Payer: Medicare Other

## 2021-08-27 ENCOUNTER — Encounter (HOSPITAL_COMMUNITY): Payer: Self-pay | Admitting: Emergency Medicine

## 2021-08-27 ENCOUNTER — Other Ambulatory Visit: Payer: Self-pay

## 2021-08-27 DIAGNOSIS — J449 Chronic obstructive pulmonary disease, unspecified: Secondary | ICD-10-CM

## 2021-08-27 DIAGNOSIS — F17210 Nicotine dependence, cigarettes, uncomplicated: Secondary | ICD-10-CM | POA: Diagnosis not present

## 2021-08-27 DIAGNOSIS — Z20822 Contact with and (suspected) exposure to covid-19: Secondary | ICD-10-CM | POA: Diagnosis not present

## 2021-08-27 DIAGNOSIS — J441 Chronic obstructive pulmonary disease with (acute) exacerbation: Secondary | ICD-10-CM | POA: Diagnosis not present

## 2021-08-27 DIAGNOSIS — R0602 Shortness of breath: Secondary | ICD-10-CM | POA: Diagnosis present

## 2021-08-27 DIAGNOSIS — I10 Essential (primary) hypertension: Secondary | ICD-10-CM | POA: Diagnosis present

## 2021-08-27 DIAGNOSIS — F411 Generalized anxiety disorder: Secondary | ICD-10-CM | POA: Diagnosis not present

## 2021-08-27 DIAGNOSIS — E876 Hypokalemia: Secondary | ICD-10-CM | POA: Diagnosis not present

## 2021-08-27 DIAGNOSIS — J9601 Acute respiratory failure with hypoxia: Secondary | ICD-10-CM

## 2021-08-27 DIAGNOSIS — Z79899 Other long term (current) drug therapy: Secondary | ICD-10-CM | POA: Insufficient documentation

## 2021-08-27 LAB — BASIC METABOLIC PANEL
Anion gap: 9 (ref 5–15)
BUN: 13 mg/dL (ref 8–23)
CO2: 27 mmol/L (ref 22–32)
Calcium: 9.2 mg/dL (ref 8.9–10.3)
Chloride: 104 mmol/L (ref 98–111)
Creatinine, Ser: 0.64 mg/dL (ref 0.61–1.24)
GFR, Estimated: >60 mL/min (ref >60)
Glucose, Bld: 104 mg/dL — ABNORMAL HIGH (ref 70–99)
Potassium: 3.1 mmol/L — ABNORMAL LOW (ref 3.5–5.1)
Sodium: 140 mmol/L (ref 135–145)

## 2021-08-27 LAB — RESP PANEL BY RT-PCR (FLU A&B, COVID) ARPGX2
Influenza A by PCR: NEGATIVE
Influenza B by PCR: NEGATIVE
SARS Coronavirus 2 by RT PCR: NEGATIVE

## 2021-08-27 LAB — RESPIRATORY PANEL BY PCR

## 2021-08-27 LAB — CBC WITH DIFFERENTIAL/PLATELET
Abs Immature Granulocytes: 0.1 10*3/uL — ABNORMAL HIGH (ref 0.00–0.07)
Basophils Absolute: 0.1 10*3/uL (ref 0.0–0.1)
Basophils Relative: 1 %
Eosinophils Absolute: 0 10*3/uL (ref 0.0–0.5)
Eosinophils Relative: 1 %
HCT: 43.8 % (ref 39.0–52.0)
Hemoglobin: 14.4 g/dL (ref 13.0–17.0)
Immature Granulocytes: 1 %
Lymphocytes Relative: 24 %
Lymphs Abs: 2.1 10*3/uL (ref 0.7–4.0)
MCH: 30.3 pg (ref 26.0–34.0)
MCHC: 32.9 g/dL (ref 30.0–36.0)
MCV: 92 fL (ref 80.0–100.0)
Monocytes Absolute: 0.8 10*3/uL (ref 0.1–1.0)
Monocytes Relative: 9 %
Neutro Abs: 5.5 10*3/uL (ref 1.7–7.7)
Neutrophils Relative %: 64 %
Platelets: 246 10*3/uL (ref 150–400)
RBC: 4.76 MIL/uL (ref 4.22–5.81)
RDW: 14.1 % (ref 11.5–15.5)
WBC: 8.6 10*3/uL (ref 4.0–10.5)
nRBC: 0 % (ref 0.0–0.2)

## 2021-08-27 LAB — EXPECTORATED SPUTUM ASSESSMENT W GRAM STAIN, RFLX TO RESP C

## 2021-08-27 LAB — BLOOD GAS, ARTERIAL
Acid-Base Excess: 4 mmol/L — ABNORMAL HIGH (ref 0.0–2.0)
Bicarbonate: 29.2 mmol/L — ABNORMAL HIGH (ref 20.0–28.0)
O2 Saturation: 99.6 %
Patient temperature: 36.7
pCO2 arterial: 44 mmHg (ref 32–48)
pH, Arterial: 7.42 (ref 7.35–7.45)
pO2, Arterial: 108 mmHg (ref 83–108)

## 2021-08-27 LAB — LACTIC ACID, PLASMA: Lactic Acid, Venous: 1.2 mmol/L (ref 0.5–1.9)

## 2021-08-27 LAB — TROPONIN I (HIGH SENSITIVITY): Troponin I (High Sensitivity): 44 ng/L — ABNORMAL HIGH (ref <18)

## 2021-08-27 LAB — MAGNESIUM: Magnesium: 1.6 mg/dL — ABNORMAL LOW (ref 1.7–2.4)

## 2021-08-27 MED ORDER — ONDANSETRON HCL 4 MG/2ML IJ SOLN: 4.0000 mg | Freq: Four times a day (QID) | INTRAMUSCULAR | Status: DC | PRN

## 2021-08-27 MED ORDER — ONDANSETRON HCL 4 MG PO TABS: 4.0000 mg | ORAL_TABLET | Freq: Four times a day (QID) | ORAL | Status: DC | PRN

## 2021-08-27 MED ORDER — MAGNESIUM SULFATE 2 GM/50ML IV SOLN
2.0000 g | Freq: Once | INTRAVENOUS | Status: AC
  Administered 2021-08-27: 2 g via INTRAVENOUS
  Filled 2021-08-27: qty 50

## 2021-08-27 MED ORDER — METHYLPREDNISOLONE SODIUM SUCC 125 MG IJ SOLR
60.0000 mg | Freq: Two times a day (BID) | INTRAMUSCULAR | Status: DC
  Administered 2021-08-27 – 2021-08-28 (×3): 60 mg via INTRAVENOUS
  Filled 2021-08-27 (×3): qty 2

## 2021-08-27 MED ORDER — POLYETHYLENE GLYCOL 3350 17 G PO PACK: 17.0000 g | PACK | Freq: Every day | ORAL | Status: DC | PRN

## 2021-08-27 MED ORDER — IPRATROPIUM-ALBUTEROL 0.5-2.5 (3) MG/3ML IN SOLN
3.0000 mL | Freq: Three times a day (TID) | RESPIRATORY_TRACT | Status: DC
  Administered 2021-08-28: 3 mL via RESPIRATORY_TRACT
  Filled 2021-08-27: qty 3

## 2021-08-27 MED ORDER — ESCITALOPRAM OXALATE 10 MG PO TABS
10.0000 mg | ORAL_TABLET | Freq: Every day | ORAL | Status: DC
  Administered 2021-08-27 – 2021-08-28 (×2): 10 mg via ORAL
  Filled 2021-08-27 (×2): qty 1

## 2021-08-27 MED ORDER — BUDESONIDE 0.25 MG/2ML IN SUSP
0.2500 mg | Freq: Two times a day (BID) | RESPIRATORY_TRACT | Status: DC
  Administered 2021-08-27 – 2021-08-28 (×2): 0.25 mg via RESPIRATORY_TRACT
  Filled 2021-08-27 (×2): qty 2

## 2021-08-27 MED ORDER — IPRATROPIUM-ALBUTEROL 0.5-2.5 (3) MG/3ML IN SOLN
3.0000 mL | Freq: Four times a day (QID) | RESPIRATORY_TRACT | Status: DC
  Administered 2021-08-27 (×3): 3 mL via RESPIRATORY_TRACT
  Filled 2021-08-27 (×3): qty 3

## 2021-08-27 MED ORDER — SODIUM CHLORIDE 0.9 % IV SOLN
100.0000 mg | Freq: Two times a day (BID) | INTRAVENOUS | Status: DC
  Administered 2021-08-27 – 2021-08-28 (×3): 100 mg via INTRAVENOUS
  Filled 2021-08-27 (×3): qty 100

## 2021-08-27 MED ORDER — METHYLPREDNISOLONE SODIUM SUCC 125 MG IJ SOLR
125.0000 mg | Freq: Once | INTRAMUSCULAR | Status: AC
  Administered 2021-08-27: 125 mg via INTRAVENOUS
  Filled 2021-08-27: qty 2

## 2021-08-27 MED ORDER — IPRATROPIUM-ALBUTEROL 0.5-2.5 (3) MG/3ML IN SOLN: 3.0000 mL | RESPIRATORY_TRACT | Status: DC | PRN

## 2021-08-27 MED ORDER — POTASSIUM CHLORIDE 2 MEQ/ML IV SOLN
INTRAVENOUS | Status: DC
  Filled 2021-08-27 (×2): qty 1000

## 2021-08-27 MED ORDER — HYDRALAZINE HCL 20 MG/ML IJ SOLN: 10.0000 mg | Freq: Four times a day (QID) | INTRAMUSCULAR | Status: DC | PRN

## 2021-08-27 MED ORDER — ACETAMINOPHEN 650 MG RE SUPP: 650.0000 mg | Freq: Four times a day (QID) | RECTAL | Status: DC | PRN

## 2021-08-27 MED ORDER — POTASSIUM CHLORIDE CRYS ER 20 MEQ PO TBCR
40.0000 meq | EXTENDED_RELEASE_TABLET | ORAL | Status: AC
  Administered 2021-08-27 (×2): 40 meq via ORAL
  Filled 2021-08-27 (×2): qty 2

## 2021-08-27 MED ORDER — ALBUTEROL SULFATE (2.5 MG/3ML) 0.083% IN NEBU
10.0000 mg/h | INHALATION_SOLUTION | Freq: Once | RESPIRATORY_TRACT | Status: AC
  Administered 2021-08-27: 10 mg/h via RESPIRATORY_TRACT
  Filled 2021-08-27: qty 12
  Filled 2021-08-27: qty 3

## 2021-08-27 MED ORDER — ENOXAPARIN SODIUM 40 MG/0.4ML IJ SOSY
40.0000 mg | PREFILLED_SYRINGE | INTRAMUSCULAR | Status: DC
  Administered 2021-08-27 – 2021-08-28 (×2): 40 mg via SUBCUTANEOUS
  Filled 2021-08-27 (×2): qty 0.4

## 2021-08-27 MED ORDER — ACETAMINOPHEN 325 MG PO TABS
650.0000 mg | ORAL_TABLET | Freq: Four times a day (QID) | ORAL | Status: DC | PRN
  Administered 2021-08-27: 650 mg via ORAL
  Filled 2021-08-27: qty 2

## 2021-08-27 MED ORDER — GUAIFENESIN ER 600 MG PO TB12
1200.0000 mg | ORAL_TABLET | Freq: Two times a day (BID) | ORAL | Status: DC
  Administered 2021-08-27 – 2021-08-28 (×3): 1200 mg via ORAL
  Filled 2021-08-27 (×3): qty 2

## 2021-08-27 MED ORDER — AMLODIPINE BESYLATE 5 MG PO TABS
5.0000 mg | ORAL_TABLET | Freq: Every day | ORAL | Status: DC
  Administered 2021-08-27 – 2021-08-28 (×2): 5 mg via ORAL
  Filled 2021-08-27 (×2): qty 1

## 2021-08-27 MED ORDER — ARFORMOTEROL TARTRATE 15 MCG/2ML IN NEBU
15.0000 ug | INHALATION_SOLUTION | Freq: Two times a day (BID) | RESPIRATORY_TRACT | Status: DC
  Administered 2021-08-27 – 2021-08-28 (×2): 15 ug via RESPIRATORY_TRACT
  Filled 2021-08-27 (×2): qty 2

## 2021-08-27 NOTE — Assessment & Plan Note (Signed)
?   Replacement intravenous magnesium sulfate ?? Monitoring magnesium levels with serial chemistries. ?

## 2021-08-27 NOTE — Assessment & Plan Note (Signed)
?   Hypokalemia likely secondary to poor oral intake and nebulized treatments ?? Replacing with intravenous potassium chloride ?? Evaluating for concurrent hypomagnesemia  ?? Monitoring potassium levels with serial chemistries. ? ?

## 2021-08-27 NOTE — Assessment & Plan Note (Signed)
?   Resume patients home regimen of oral antihypertensives ?? Titrate antihypertensive regimen as necessary to achieve adequate BP control ?? PRN intravenous antihypertensives for excessively elevated blood pressure ?? Hypertension likely exacerbated by respiratory distress ? ? ?

## 2021-08-27 NOTE — Assessment & Plan Note (Signed)
?   Patient presenting with 24 hours of rapidly worsening shortness of breath with associated wheezing ?? Physical exam most consistent with COPD exacerbation with notable wheezing and prolonged expiratory phase ?? Chest x-ray reveals no evidence of concurrent pneumonia ?? Initiating aggressive bronchodilator therapy with intravenous Solu-Medrol ?? BiPAP therapy was initiated in the emergency department due to work of breathing.  This will be slowly weaned off as tolerated ?? Patient's ongoing worsening cough is concerning for a concurrent acute bacterial bronchitis and therefore patient will be treated with doxycycline as well ?? Placing patient in progressive unit for close clinical monitoring as patient is at high risk of rapid clinical decompensation. ?

## 2021-08-27 NOTE — Assessment & Plan Note (Signed)
?   Continue home regimen of psychotropic medication regimen ?

## 2021-08-27 NOTE — H&P (Addendum)
?History and Physical  ? ? ?Patient: Gavin Kane MRN: [id number] DOA: 08/27/2021 ? ?Date of Service: the patient was seen and examined on 08/27/2021 ? ?Patient coming from: Home via EMS ? ?Chief Complaint:  ?Chief Complaint  ?Patient presents with  ? Shortness of Breath  ?   ?  ? ? ?HPI:  ? ?66 year old male with past medical history of hypertension, anxiety disorder, COPD, nicotine dependence who presents to [facility] emergency department via EMS with complaints of shortness of breath. ? ?Patient explains that for approximate the past 1 to 2 days he has been developing shortness of breath.  Shortness of breath is rather sudden in onset rapidly becoming more more severe in intensity.  Shortness breath is worse with minimal exertion and improved with rest.  Patient complains of associated cough that is not particularly productive.  Patient denies chest pain, fevers, sick contacts, recent travel or contact with confirmed COVID-19 infection. ? ?Patient's symptoms continue to worsen throughout the day until EMS was contacted who promptly came to evaluate the patient.  They noted the patient was saturating in the upper 80s and therefore placed the patient on supplemental oxygen.  Patient was noted to be wheezing by EMS who administered a DuoNeb in route to [facility] emergency department. ? ?Upon evaluation in the emergency department patient was clinically noted to be in respiratory distress thought to be secondary to a COPD exacerbation.  Patient was administered an additional albuterol nebulized treatment as well as 125 mg of Solu-Medrol and initiated on BiPAP therapy.  The hospitalist group is now been called to assess the patient for admission the hospital. ? ?Review of Systems: Review of Systems  ?Respiratory:  Positive for cough and shortness of breath.   ?All other systems reviewed and are negative. ? ? ?Past Medical History:  ?Diagnosis Date  ? Anxiety   ? COPD (chronic obstructive pulmonary disease)  ([place])   ? HTN (hypertension)   ? ? ?Past Surgical History:  ?Procedure Laterality Date  ? HEMORRHOID SURGERY    ? ? ?Social History:  reports that he has been smoking cigarettes. He started smoking about 51 years ago. He has a 24.00 pack-year smoking history. He has quit using smokeless tobacco. He reports current alcohol use. He reports that he does not use drugs. ? ?No Known Allergies ? ?Family History  ?Problem Relation Age of Onset  ? Cancer Other   ?     Aunt  ? Colon cancer Neg Hx   ? Colon polyps Neg Hx   ? Rectal cancer Neg Hx   ? Stomach cancer Neg Hx   ? ? ?Prior to Admission medications   ?Medication Sig Start Date End Date Taking? Authorizing Provider  ?albuterol (VENTOLIN HFA) 108 (90 Base) MCG/ACT inhaler INHALE 2 PUFFS INTO THE LUNGS EVERY 4 HOURS AS NEEDED FOR WHEEZING OR SHORTNESS OF BREATH 08/15/21   Autry-Lott, Naaman Plummer, DO  ?amLODipine (NORVASC) 5 MG tablet TAKE 1 TABLET(5 MG) BY MOUTH DAILY 05/28/21   Autry-Lott, Naaman Plummer, DO  ?DULERA 200-5 MCG/ACT AERO INHALE 2 PUFFS INTO THE LUNGS TWICE DAILY 08/15/21   Autry-Lott, Naaman Plummer, DO  ?escitalopram (LEXAPRO) 10 MG tablet TAKE 1 TABLET(10 MG) BY MOUTH DAILY 03/10/21   Autry-Lott, Naaman Plummer, DO  ?tiotropium (SPIRIVA HANDIHALER) 18 MCG inhalation capsule Use on capsule with 2 oral inhalations once per day ?Patient taking differently: Place 18 mcg into inhaler and inhale daily. Use on capsule with 2 oral inhalations once per day 04/16/19   Nuala Alpha,  MD  ?albuterol (VENTOLIN HFA) 108 (90 Base) MCG/ACT inhaler INHALE 2 PUFFS INTO THE LUNGS DAILY FOR SHORTNESS OF BREATH OR COUGH ?Patient taking differently: 2 puffs every 4 (four) hours as needed for shortness of breath or wheezing. 03/07/21   Autry-Lott, Naaman Plummer, DO  ?ipratropium (ATROVENT) 0.06 % nasal spray Place 2 sprays into both nostrils 4 (four) times daily. ?Patient not taking: Reported on 06/03/2019 11/23/18 06/22/19  Ok Edwards, PA-C  ? ? ?Physical Exam: ? ?Vitals:  ? 08/27/21 0215 08/27/21 0230 08/27/21  0320 08/27/21 0428  ?BP: (!) 171/108 (!) 157/102 (!) 152/93 (!) 150/93  ?Pulse: (!) 109 (!) 107 (!) 111 (!) 109  ?Resp: 20 (!) '23 18 20  '$ ?Temp:    98.3 ?F (36.8 ?C)  ?TempSrc:    Oral  ?SpO2: 100% 100% 100% 98%  ?Weight:      ?Height:      ? ? ?Constitutional: Awake alert and oriented x3, patient is in respiratory distress ?Skin: no rashes, no lesions, good skin turgor noted. ?Eyes: Pupils are equally reactive to light.  No evidence of scleral icterus or conjunctival pallor.  ?ENMT: Moist mucous membranes noted.  Posterior pharynx clear of any exudate or lesions.   ?Neck: normal, supple, no masses, no thyromegaly.  No evidence of jugular venous distension.   ?Respiratory: Markedly diminished breath sounds in all fields with prolonged expiratory phase and expiratory wheezing.  Mild bibasilar rales.  Increased respiratory effort noted with accessory muscle use.   ?Cardiovascular: Tachycardic and regular, no murmurs / rubs / gallops. No extremity edema. 2+ pedal pulses. No carotid bruits.  ?Chest:   Nontender without crepitus or deformity.   ?Back:   Nontender without crepitus or deformity. ?Abdomen: Abdomen is soft and nontender.  No evidence of intra-abdominal masses.  Positive bowel sounds noted in all quadrants.   ?Musculoskeletal: No joint deformity upper and lower extremities. Good ROM, no contractures. Normal muscle tone.  ?Neurologic: CN 2-12 grossly intact. Sensation intact.  Patient moving all 4 extremities spontaneously.  Patient is following all commands.  Patient is responsive to verbal stimuli.   ?Psychiatric: Patient exhibits anxious mood with appropriate affect..  Patient seems to possess insight as to their current situation.   ? ?Data Reviewed: ? ?I have personally reviewed and interpreted labs, imaging. ? ?Significant findings are potassium of 3.1, White blood cell count of 8.6, COVID-19 and influenza PCR testing negative. ? ?ABG performed revealing pH 7.42, PCO2 of 44, PaO2 of 108 on BiPAP  therapy. ? ?Chest x-ray personally reviewed revealing no evidence of cardiopulmonary disease. ? ? ? ?Assessment and Plan: ?* COPD with acute exacerbation ([place]) ?Patient presenting with 24 hours of rapidly worsening shortness of breath with associated wheezing ?Physical exam most consistent with COPD exacerbation with notable wheezing and prolonged expiratory phase ?Chest x-ray reveals no evidence of concurrent pneumonia ?Initiating aggressive bronchodilator therapy with intravenous Solu-Medrol ?BiPAP therapy was initiated in the emergency department due to work of breathing.  This will be slowly weaned off as tolerated ?Patient's ongoing worsening cough is concerning for a concurrent acute bacterial bronchitis and therefore patient will be treated with doxycycline as well ?Considering severity of presentation with high risk of further deterioration will additionally obtain respiratory viral panel to identify of a viral respiratory infection is contributing to the patients presentation .  ?Placing patient in progressive unit for close clinical monitoring as patient is at high risk of rapid clinical decompensation. ? ?Hypokalemia ?Hypokalemia likely secondary to poor oral intake and nebulized  treatments ?Replacing with intravenous potassium chloride ?Evaluating for concurrent hypomagnesemia  ?Monitoring potassium levels with serial chemistries. ? ? ?Essential hypertension ?Resume patients home regimen of oral antihypertensives ?Titrate antihypertensive regimen as necessary to achieve adequate BP control ?PRN intravenous antihypertensives for excessively elevated blood pressure ?Hypertension likely exacerbated by respiratory distress ? ? ? ?Hypomagnesemia ?Replacement intravenous magnesium sulfate ?Monitoring magnesium levels with serial chemistries. ? ?Generalized anxiety disorder ?Continue home regimen of psychotropic medication regimen ? ?Nicotine dependence, cigarettes, uncomplicated ?Counseling patient on smoking  cessation daily ?Patient declining nicotine replacement therapy at this time ? ? ? ? ? ? ?Code Status:  Full code  code status decision has been confirmed with: patient ?Family Communication: deferred  ? ?Consult

## 2021-08-27 NOTE — Progress Notes (Signed)
Called to Pt rooms for increased wob and sob, Pt was in severe distress, immediately placed on BiPAP. Pt tolerating moderately well at this time. ?

## 2021-08-27 NOTE — Progress Notes (Addendum)
?PROGRESS NOTE ? ? ? ?Gavin Kane  LFY:[id number] DOB: 1955/07/21 DOA: 08/27/2021 ?PCP: Gerlene Fee, DO  ? ?Brief Narrative: ?66 year old with past medical history significant for hypertension, anxiety disorder, COPD, nicotine dependence who presents complaining of shortness of breath.  He reports shortness of breath for the last 2 days progressively getting worse, to the point of significant shortness of breath with minimal exertion.  He report cough.  He denies chest pain fevers.  EMS was called, oxygen saturation at the time of evaluation was 80% patient was placed on supplemental oxygen.  Received DuoNebs. ? ?Evaluation in the ED patient was noted to be in respiratory distress thought to be secondary to COPD exacerbation he received nebulizers IV Solu-Medrol and was placed on BiPAP. ? ?Sequently he was weaned off of BiPAP.  ? ? ?Assessment & Plan: ?  ?Principal Problem: ?  COPD with acute exacerbation ([place]) ?Active Problems: ?  Hypokalemia ?  Essential hypertension ?  Hypomagnesemia ?  Generalized anxiety disorder ?  Nicotine dependence, cigarettes, uncomplicated ? ?1-Acute COPD exacerbation: ?Chest x-ray negative for pneumonia. ?He required BiPAP initially in the ED. ?Continue with nebulizer, doxycycline. ?Continue with IV steroids.  ?Smoking cessation counseled ?Start Brovana, pulmicort, guaifenesin. ?Flutter valve ?Follow respiratory culture.  ? ?2-Hypokalemia: Replace orally ? ?3-Hypertension: ?Continue with Norvasc.  ?PRN hydralazine.  ? ?4-Hypomagnesemia: Replete IV x2 ? ?Generalized anxiety disorder: ?Continue with Lexapro.  ? ?Nicotine dependence: Counseling  provided ? ? ? ?Estimated body mass index is 21.63 kg/m? as calculated from the following: ?  Height as of this encounter: '5\' 5"'$  (1.651 m). ?  Weight as of this encounter: 59 kg. ? ? ?DVT prophylaxis: Lovenox ?Code Status: Full code ?Family Communication: Care discussed with patient.  ?Disposition Plan:  ?Status is: Inpatient ?Remains  inpatient appropriate because: management of COPD exacerbation.  ? ? ? ?Consultants:  ?None ? ?Procedures:  ?None ? ?Antimicrobials:  ?Doxycycline.  ? ?Subjective: ?He continue to smoke. He report SOB and productive cough. Notice some improvement SOB since admission.  ? ?Objective: ?Vitals:  ? 08/27/21 0800 08/27/21 0903 08/27/21 0945 08/27/21 0949  ?BP:  (!) 174/105 (!) 149/87 (!) 149/87  ?Pulse:  (!) 110  (!) 104  ?Resp: (!) '24 20  20  '$ ?Temp:  98.5 ?F (36.9 ?C)    ?TempSrc:  Oral    ?SpO2:  99%    ?Weight:      ?Height:      ? ? ?Intake/Output Summary (Last 24 hours) at 08/27/2021 1323 ?Last data filed at 08/27/2021 1200 ?Gross per 24 hour  ?Intake 813.25 ml  ?Output 125 ml  ?Net 688.25 ml  ? ?Filed Weights  ? 08/27/21 0019  ?Weight: 59 kg  ? ? ?Examination: ? ?General exam: Appears calm and comfortable  ?Respiratory system: Mild tachypnea, BL ronchus.  ?Cardiovascular system: S1 & S2 heard, RRR. No JVD, murmurs, rubs, gallops or clicks. No pedal edema. ?Gastrointestinal system: Abdomen is nondistended, soft and nontender. No organomegaly or masses felt. Normal bowel sounds heard. ?Central nervous system: Alert and oriented. No focal neurological deficits. ?Extremities: Symmetric 5 x 5 power. ?Skin: No rashes, lesions or ulcers ? ? ? ?Data Reviewed: I have personally reviewed following labs and imaging studies ? ?CBC: ?Recent Labs  ?Lab 08/27/21 ?0017  ?WBC 8.6  ?NEUTROABS 5.5  ?HGB 14.4  ?HCT 43.8  ?MCV 92.0  ?PLT 246  ? ?Basic Metabolic Panel: ?Recent Labs  ?Lab 08/27/21 ?0017 08/27/21 ?0024  ?NA 140  --   ?K 3.1*  --   ?  CL 104  --   ?CO2 27  --   ?GLUCOSE 104*  --   ?BUN 13  --   ?CREATININE 0.64  --   ?CALCIUM 9.2  --   ?MG  --  1.6*  ? ?GFR: ?Estimated Creatinine Clearance: 74.8 mL/min (by C-G formula based on SCr of 0.64 mg/dL). ?Liver Function Tests: ?No results for input(s): AST, ALT, ALKPHOS, BILITOT, PROT, ALBUMIN in the last 168 hours. ?No results for input(s): LIPASE, AMYLASE in the last 168 hours. ?No  results for input(s): AMMONIA in the last 168 hours. ?Coagulation Profile: ?No results for input(s): INR, PROTIME in the last 168 hours. ?Cardiac Enzymes: ?No results for input(s): CKTOTAL, CKMB, CKMBINDEX, TROPONINI in the last 168 hours. ?BNP (last 3 results) ?No results for input(s): PROBNP in the last 8760 hours. ?HbA1C: ?No results for input(s): HGBA1C in the last 72 hours. ?CBG: ?No results for input(s): GLUCAP in the last 168 hours. ?Lipid Profile: ?No results for input(s): CHOL, HDL, LDLCALC, TRIG, CHOLHDL, LDLDIRECT in the last 72 hours. ?Thyroid Function Tests: ?No results for input(s): TSH, T4TOTAL, FREET4, T3FREE, THYROIDAB in the last 72 hours. ?Anemia Panel: ?No results for input(s): VITAMINB12, FOLATE, FERRITIN, TIBC, IRON, RETICCTPCT in the last 72 hours. ?Sepsis Labs: ?Recent Labs  ?Lab 08/27/21 ?1191  ?LATICACIDVEN 1.2  ? ? ?Recent Results (from the past 240 hour(s))  ?Resp Panel by RT-PCR (Flu A&B, Covid) Nasopharyngeal Swab     Status: None  ? Collection Time: 08/27/21 12:27 AM  ? Specimen: Nasopharyngeal Swab; Nasopharyngeal(NP) swabs in vial transport medium  ?Result Value Ref Range Status  ? SARS Coronavirus 2 by RT PCR NEGATIVE NEGATIVE Final  ?  Comment: (NOTE) ?SARS-CoV-2 target nucleic acids are NOT DETECTED. ? ?The SARS-CoV-2 RNA is generally detectable in upper respiratory ?specimens during the acute phase of infection. The lowest ?concentration of SARS-CoV-2 viral copies this assay can detect is ?138 copies/mL. A negative result does not preclude SARS-Cov-2 ?infection and should not be used as the sole basis for treatment or ?other patient management decisions. A negative result may occur with  ?improper specimen collection/handling, submission of specimen other ?than nasopharyngeal swab, presence of viral mutation(s) within the ?areas targeted by this assay, and inadequate number of viral ?copies(<138 copies/mL). A negative result must be combined with ?clinical observations, patient  history, and epidemiological ?information. The expected result is Negative. ? ?Fact Sheet for Patients:  ?EntrepreneurPulse.com.au ? ?Fact Sheet for Healthcare Providers:  ?IncredibleEmployment.be ? ?This test is no t yet approved or cleared by the [place] FDA and  ?has been authorized for detection and/or diagnosis of SARS-CoV-2 by ?FDA under an Emergency Use Authorization (EUA). This EUA will remain  ?in effect (meaning this test can be used) for the duration of the ?COVID-19 declaration under Section 564(b)(1) of the Act, 21 ?U.S.C.section 360bbb-3(b)(1), unless the authorization is terminated  ?or revoked sooner.  ? ? ?  ? Influenza A by PCR NEGATIVE NEGATIVE Final  ? Influenza B by PCR NEGATIVE NEGATIVE Final  ?  Comment: (NOTE) ?The Xpert Xpress SARS-CoV-2/FLU/RSV plus assay is intended as an aid ?in the diagnosis of influenza from Nasopharyngeal swab specimens and ?should not be used as a sole basis for treatment. Nasal washings and ?aspirates are unacceptable for Xpert Xpress SARS-CoV-2/FLU/RSV ?testing. ? ?Fact Sheet for Patients: ?EntrepreneurPulse.com.au ? ?Fact Sheet for Healthcare Providers: ?IncredibleEmployment.be ? ?This test is not yet approved or cleared by the [place] FDA and ?has been authorized for detection and/or diagnosis of SARS-CoV-2  by ?FDA under an Emergency Use Authorization (EUA). This EUA will remain ?in effect (meaning this test can be used) for the duration of the ?COVID-19 declaration under Section 564(b)(1) of the Act, 21 U.S.C. ?section 360bbb-3(b)(1), unless the authorization is terminated or ?revoked. ? ?Performed at [facility], [address] ?[place], [place] [address] ?  ?Respiratory (~20 pathogens) panel by PCR     Status: None  ? Collection Time: 08/27/21  5:44 AM  ? Specimen: Nasopharyngeal Swab; Respiratory  ?Result Value Ref Range Status  ? Adenovirus NOT DETECTED NOT  DETECTED Final  ? Coronavirus 229E NOT DETECTED NOT DETECTED Final  ?  Comment: (NOTE) ?The Coronavirus on the Respiratory Panel, DOES NOT test for the novel  ?Coronavirus (2019 nCoV) ?  ? Coronavirus HKU1 NOT

## 2021-08-27 NOTE — Assessment & Plan Note (Signed)
?   Counseling patient on smoking cessation daily ?? Patient declining nicotine replacement therapy at this time ?

## 2021-08-27 NOTE — Progress Notes (Signed)
?  Transition of Care (TOC) Screening Note ? ? ?Patient Details  ?Name: Gavin Kane ?Date of Birth: 1955-10-23 ? ? ?Transition of Care [facility]) CM/SW Contact:    ?Dessa Phi, RN ?Phone Number: ?08/27/2021, 11:46 AM ? ? ? ?Transition of Care Department [facility]) has reviewed patient and no TOC needs have been identified at this time. We will continue to monitor patient advancement through interdisciplinary progression rounds. If new patient transition needs arise, please place a TOC consult. ?  ?

## 2021-08-27 NOTE — ED Triage Notes (Addendum)
Pt BIB [facility] from home. Hx COPD. Neb a couple times today without relief. Tripodding on EMS arrival. Wheezing noted on EMS arrival. ? ?'5mg'$  albuterol, 0.5 atrovent given en route. ? ?20 LAC. NO IV meds en route.  ?

## 2021-08-27 NOTE — ED Provider Notes (Signed)
?[facility] ?Provider Note ? ? ?CSN: [id number] ?Arrival date & time: 08/27/21  0009 ? ?  ? ?History ? ?Chief Complaint  ?Patient presents with  ? Shortness of Breath  ?   ?  ? ?Level 5 caveat due to acuity of condition ?Gavin Kane is a 66 y.o. male. ? ?The history is provided by the patient.  ?Shortness of Breath ?Severity:  Severe ?Onset quality:  Gradual ?Duration:  1 day ?Timing:  Constant ?Progression:  Worsening ?Chronicity:  New ?Relieved by:  Nothing ?Worsened by:  Nothing ?Patient reports increasing cough, wheezing, shortness of breath.  No chest pain.  No hemoptysis.  He is a smoker ?  ? ?Home Medications ?Prior to Admission medications   ?Medication Sig Start Date End Date Taking? Authorizing Provider  ?albuterol (VENTOLIN HFA) 108 (90 Base) MCG/ACT inhaler INHALE 2 PUFFS INTO THE LUNGS EVERY 4 HOURS AS NEEDED FOR WHEEZING OR SHORTNESS OF BREATH 08/15/21   Autry-Lott, Naaman Plummer, DO  ?amLODipine (NORVASC) 5 MG tablet TAKE 1 TABLET(5 MG) BY MOUTH DAILY 05/28/21   Autry-Lott, Naaman Plummer, DO  ?DULERA 200-5 MCG/ACT AERO INHALE 2 PUFFS INTO THE LUNGS TWICE DAILY 08/15/21   Autry-Lott, Naaman Plummer, DO  ?escitalopram (LEXAPRO) 10 MG tablet TAKE 1 TABLET(10 MG) BY MOUTH DAILY 03/10/21   Autry-Lott, Naaman Plummer, DO  ?tiotropium (SPIRIVA HANDIHALER) 18 MCG inhalation capsule Use on capsule with 2 oral inhalations once per day ?Patient taking differently: Place 18 mcg into inhaler and inhale daily. Use on capsule with 2 oral inhalations once per day 04/16/19   Nuala Alpha, MD  ?albuterol (VENTOLIN HFA) 108 (90 Base) MCG/ACT inhaler INHALE 2 PUFFS INTO THE LUNGS DAILY FOR SHORTNESS OF BREATH OR COUGH ?Patient taking differently: 2 puffs every 4 (four) hours as needed for shortness of breath or wheezing. 03/07/21   Autry-Lott, Naaman Plummer, DO  ?ipratropium (ATROVENT) 0.06 % nasal spray Place 2 sprays into both nostrils 4 (four) times daily. ?Patient not taking: Reported on 06/03/2019 11/23/18 06/22/19  Ok Edwards, PA-C  ?   ? ?Allergies    ?Patient has no known allergies.   ? ?Review of Systems   ?Review of Systems  ?Unable to perform ROS: Acuity of condition  ?Respiratory:  Positive for shortness of breath.   ? ?Physical Exam ?Updated Vital Signs ?BP (!) 162/119   Pulse (!) 111   Temp 98 ?F (36.7 ?C) (Oral)   Resp 19   Ht 1.651 m ('5\' 5"'$ )   Wt 59 kg   SpO2 100%   BMI 21.63 kg/m?  ?Physical Exam ?CONSTITUTIONAL: Elderly, distress noted ?HEAD: Normocephalic/atraumatic ?EYES: EOMI/PERRL ?ENMT: Mucous membranes moist ?NECK: supple no meningeal signs ?CV: S1/S2 noted, tachycardic ?LUNGS: Tachypnea, wheezing bilaterally ?ABDOMEN: soft, nontender, no rebound or guarding, bowel sounds noted throughout abdomen ?GU:no cva tenderness ?NEURO: Pt is awake/alert/appropriate, moves all extremitiesx4.  No facial droop.   ?EXTREMITIES: pulses normal/equal, full ROM, no edema noted ?SKIN: warm, color normal ?PSYCH: Anxious ? ?ED Results / Procedures / Treatments   ?Labs ?(all labs ordered are listed, but only abnormal results are displayed) ?Labs Reviewed  ?BASIC METABOLIC PANEL - Abnormal; Notable for the following components:  ?    Result Value  ? Potassium 3.1 (*)   ? Glucose, Bld 104 (*)   ? All other components within normal limits  ?CBC WITH DIFFERENTIAL/PLATELET - Abnormal; Notable for the following components:  ? Abs Immature Granulocytes 0.10 (*)   ? All other components within normal limits  ?RESP PANEL BY  RT-PCR (FLU A&B, COVID) [id number]  ? ? ?EKG ? ED ECG REPORT ? ? Date: 08/27/2021 0016 ? Rate: 123 ? Rhythm: sinus tachycardia ? QRS Axis: normal ? Intervals: normal ? ST/T Wave abnormalities: nonspecific ST changes ? Conduction Disutrbances:none ? ? ?I have personally reviewed the EKG tracing and agree with the computerized printout as noted. ? ? ?Radiology ?DG Chest Port 1 View ? ?Result Date: 08/27/2021 ?CLINICAL DATA:  Shortness of breath and wheezing EXAM: PORTABLE CHEST 1 VIEW COMPARISON:  02/12/2021 FINDINGS: The  heart size and mediastinal contours are within normal limits. Both lungs are clear. The visualized skeletal structures are unremarkable. IMPRESSION: No active disease. Electronically Signed   By: Donavan Foil M.D.   On: 08/27/2021 00:54   ? ?Procedures ?[facility] Kitchen[facility] Care ?Performed by: Ripley Fraise, MD ?Authorized by: Ripley Fraise, MD  ? ?Critical care provider statement:  ?  Critical care time (minutes):  80 ?  Critical care start time:  08/27/2021 12:57 AM ?  Critical care end time:  08/27/2021 2:18 AM ?  Critical care time was exclusive of:  Separately billable procedures and treating other patients ?  Critical care was necessary to treat or prevent imminent or life-threatening deterioration of the following conditions:  Respiratory failure ?  Critical care was time spent personally by me on the following activities:  Development of treatment plan with patient or surrogate, evaluation of patient's response to treatment, examination of patient, re-evaluation of patient's condition, pulse oximetry, ordering and review of radiographic studies, ordering and review of laboratory studies, ordering and performing treatments and interventions and review of old charts ?  I assumed direction of critical care for this patient from another provider in my specialty: no   ?  Care discussed with: admitting provider    ? ? ?Medications Ordered in ED ?Medications  ?albuterol (PROVENTIL) (2.5 MG/3ML) 0.083% nebulizer solution (10 mg/hr Nebulization Given 08/27/21 0042)  ?methylPREDNISolone sodium succinate (SOLU-MEDROL) 125 mg/2 mL injection 125 mg (125 mg Intravenous Given 08/27/21 0023)  ? ? ?ED Course/ Medical Decision Making/ A&P ?Clinical Course as of 08/27/21 0218  ?Mon Aug 27, 2021  ?0059 Potassium(!): 3.1 ?Mild hypokalemia [DW]  ?0109 Patient appears to be worsening on his continuous nebulizer, will transition to BiPAP [DW]  ?0125 Patient improving on BiPAP [DW]  ?  ?Clinical Course User Index ?[DW] Ripley Fraise, MD   ? ?                        ?Medical Decision Making ?Amount and/or Complexity of Data Reviewed ?Labs: ordered. Decision-making details documented in ED Course. ?Radiology: ordered. ? ?Risk ?Prescription drug management. ?Decision regarding hospitalization. ? ? ?This patient presents to the ED for concern of shortness of breath, this involves an extensive number of treatment options, and is a complaint that carries with it a high risk of complications and morbidity.  The differential diagnosis includes but is not limited to pneumonia, CHF, COPD exacerbation, pulmonary embolism, acute coronary syndrome ? ?Comorbidities that complicate the patient evaluation: ?Patient?s presentation is complicated by their history of COPD ? ?Social Determinants of Health: ?Patient?s  tobacco use   increases the complexity of managing their presentation ? ?Additional history obtained: ? ?Records reviewed Primary Care Documents ? ?Lab Tests: ?I Ordered, and personally interpreted labs.  The pertinent results include: Mild hypokalemia ? ?Imaging Studies ordered: ?I ordered imaging studies including X-ray chest   ?I independently visualized and interpreted imaging which showed no acute findings ?I  agree with the radiologist interpretation ? ?Cardiac Monitoring: ?The patient was maintained on a cardiac monitor.  I personally viewed and interpreted the cardiac monitor which showed an underlying rhythm of:  sinus tachycardia ? ?Medicines ordered and prescription drug management: ?I ordered medication including albuterol and steroids for wheezing ?Reevaluation of the patient after these medicines showed that the patient    improved ? ? ?Critical Interventions: ? ?Noninvasive ventilation ? ?Consultations Obtained: ?I requested consultation with the admitting physician Dr.shalhoub , and discussed  findings as well as pertinent plan - they recommend: admit ? ?Reevaluation: ?After the interventions noted above, I reevaluated the patient and found  that they have :improved ? ?Complexity of problems addressed: ?Patient?s presentation is most consistent with  acute presentation with potential threat to life or bodily function ? ?Disposition: ?After conside

## 2021-08-28 DIAGNOSIS — J441 Chronic obstructive pulmonary disease with (acute) exacerbation: Secondary | ICD-10-CM | POA: Diagnosis not present

## 2021-08-28 LAB — BASIC METABOLIC PANEL
Anion gap: 5 (ref 5–15)
BUN: 14 mg/dL (ref 8–23)
CO2: 24 mmol/L (ref 22–32)
Calcium: 9.2 mg/dL (ref 8.9–10.3)
Chloride: 107 mmol/L (ref 98–111)
Creatinine, Ser: 0.65 mg/dL (ref 0.61–1.24)
GFR, Estimated: >60 mL/min (ref >60)
Glucose, Bld: 155 mg/dL — ABNORMAL HIGH (ref 70–99)
Potassium: 4.2 mmol/L (ref 3.5–5.1)
Sodium: 136 mmol/L (ref 135–145)

## 2021-08-28 LAB — MAGNESIUM: Magnesium: 2.2 mg/dL (ref 1.7–2.4)

## 2021-08-28 MED ORDER — DULERA 200-5 MCG/ACT IN AERO
2.0000 | INHALATION_SPRAY | Freq: Two times a day (BID) | RESPIRATORY_TRACT | 6 refills | Status: DC
Start: 2021-08-28 — End: 2023-06-22

## 2021-08-28 MED ORDER — DOXYCYCLINE HYCLATE 100 MG PO TABS: 100.0000 mg | ORAL_TABLET | Freq: Two times a day (BID) | ORAL | 0 refills | Status: AC

## 2021-08-28 MED ORDER — ALBUTEROL SULFATE HFA 108 (90 BASE) MCG/ACT IN AERS: 2.0000 | INHALATION_SPRAY | RESPIRATORY_TRACT | 6 refills | Status: DC | PRN

## 2021-08-28 MED ORDER — SPIRIVA HANDIHALER 18 MCG IN CAPS: ORAL_CAPSULE | RESPIRATORY_TRACT | 3 refills | Status: DC

## 2021-08-28 MED ORDER — DOXYCYCLINE HYCLATE 100 MG PO TABS: 100.0000 mg | ORAL_TABLET | Freq: Two times a day (BID) | ORAL | Status: DC

## 2021-08-28 MED ORDER — PREDNISONE 20 MG PO TABS: 40.0000 mg | ORAL_TABLET | Freq: Every day | ORAL | 0 refills | Status: AC

## 2021-08-28 MED ORDER — GUAIFENESIN ER 600 MG PO TB12: 1200.0000 mg | ORAL_TABLET | Freq: Two times a day (BID) | ORAL | 0 refills | Status: DC

## 2021-08-28 MED ORDER — ALBUTEROL SULFATE (2.5 MG/3ML) 0.083% IN NEBU: 2.5000 mg | INHALATION_SOLUTION | RESPIRATORY_TRACT | 12 refills | Status: DC | PRN

## 2021-08-28 NOTE — Care Management Obs Status (Signed)
MEDICARE OBSERVATION STATUS NOTIFICATION ? ? ?Patient Details  ?Name: Gavin Kane ?MRN: [id number] ?Date of Birth: Sep 09, 1955 ? ? ?Medicare Observation Status Notification Given:  Yes ? ? ? ?Dessa Phi, RN ?08/28/2021, 10:17 AM ?

## 2021-08-28 NOTE — Plan of Care (Signed)
DC paperwork reviewed with pt. Pt gathered belongings. PIVs removed. ? ? ?Problem: Education: ?Goal: Knowledge of disease or condition will improve ?Outcome: Completed/Met ?Goal: Knowledge of the prescribed therapeutic regimen will improve ?Outcome: Completed/Met ?Goal: Individualized Educational Video(s) ?Outcome: Completed/Met ?  ?Problem: Activity: ?Goal: Ability to tolerate increased activity will improve ?Outcome: Completed/Met ?Goal: Will verbalize the importance of balancing activity with adequate rest periods ?Outcome: Completed/Met ?  ?Problem: Respiratory: ?Goal: Ability to maintain a clear airway will improve ?Outcome: Completed/Met ?Goal: Levels of oxygenation will improve ?Outcome: Completed/Met ?Goal: Ability to maintain adequate ventilation will improve ?Outcome: Completed/Met ?  ?Problem: Education: ?Goal: Knowledge of General Education information will improve ?Description: Including pain rating scale, medication(s)/side effects and non-pharmacologic comfort measures ?Outcome: Completed/Met ?  ?Problem: Health Behavior/Discharge Planning: ?Goal: Ability to manage health-related needs will improve ?Outcome: Completed/Met ?  ?Problem: Clinical Measurements: ?Goal: Ability to maintain clinical measurements within normal limits will improve ?Outcome: Completed/Met ?Goal: Will remain free from infection ?Outcome: Completed/Met ?Goal: Diagnostic test results will improve ?Outcome: Completed/Met ?Goal: Respiratory complications will improve ?Outcome: Completed/Met ?Goal: Cardiovascular complication will be avoided ?Outcome: Completed/Met ?  ?Problem: Activity: ?Goal: Risk for activity intolerance will decrease ?Outcome: Completed/Met ?  ?Problem: Nutrition: ?Goal: Adequate nutrition will be maintained ?Outcome: Completed/Met ?  ?Problem: Coping: ?Goal: Level of anxiety will decrease ?Outcome: Completed/Met ?  ?Problem: Elimination: ?Goal: Will not experience complications related to bowel  motility ?Outcome: Completed/Met ?Goal: Will not experience complications related to urinary retention ?Outcome: Completed/Met ?  ?Problem: Pain Managment: ?Goal: General experience of comfort will improve ?Outcome: Completed/Met ?  ?Problem: Safety: ?Goal: Ability to remain free from injury will improve ?Outcome: Completed/Met ?  ?Problem: Skin Integrity: ?Goal: Risk for impaired skin integrity will decrease ?Outcome: Completed/Met ?  ?

## 2021-08-28 NOTE — Care Management CC44 (Signed)
Condition Code 44 Documentation Completed ? ?Patient Details  ?Name: Gavin Kane ?MRN: [id number] ?Date of Birth: 06/05/1955 ? ? ?Condition Code 44 given:  Yes ?Patient signature on Condition Code 44 notice:  Yes ?Documentation of 2 MD's agreement:  Yes ?Code 44 added to claim:  Yes ? ? ? ?Dessa Phi, RN ?08/28/2021, 10:17 AM ? ?

## 2021-08-28 NOTE — Progress Notes (Signed)
PHARMACIST - PHYSICIAN COMMUNICATION ?DR:   Tyrell Antonio ?CONCERNING: Antibiotic IV to Oral Route Change Policy ? ?RECOMMENDATION: ?This patient is receiving doxycycline by the intravenous route.  Based on criteria approved by the Pharmacy and Therapeutics Committee, the antibiotic(s) is/are being converted to the equivalent oral dose form(s). ? ? ?DESCRIPTION: ?These criteria include: ?Patient being treated for a respiratory tract infection, urinary tract infection, cellulitis or clostridium difficile associated diarrhea if on metronidazole ?The patient is not neutropenic and does not exhibit a GI malabsorption state ?The patient is eating (either orally or via tube) and/or has been taking other orally administered medications for a least 24 hours ?The patient is improving clinically and has a Tmax < 100.5 ? ?If you have questions about this conversion, please contact the Pharmacy Department  ?'[]'$   [phone] )  [facility] ?'[]'$   [phone] )  [facility]  ?'[]'$   [phone] )  [facility] ?'[x]'$   [phone] )  [facility]  ? ?Dimple Nanas, PharmD ?08/28/2021 7:22 AM ? ?

## 2021-08-28 NOTE — Discharge Summary (Signed)
?Physician Discharge Summary ?  ?Patient: Gavin Kane MRN: [id number] DOB: [birth year]  ?Admit date:     08/27/2021  ?Discharge date: 08/28/21  ?Discharge Physician: Jerald Kief A Kniyah Khun  ? ?PCP: Gerlene Fee, DO  ? ?Recommendations at discharge:  ? ? Continue counseling in regards smoking cessation.  ?Continue with titration of medication for COPD>  ?Follow final respiratory culture results.  ? ?Discharge Diagnoses: ?Principal Problem: ?  COPD with acute exacerbation ([place]) ?Active Problems: ?  Hypokalemia ?  Essential hypertension ?  Hypomagnesemia ?  Generalized anxiety disorder ?  Nicotine dependence, cigarettes, uncomplicated ? ?Resolved Problems: ?  * No resolved hospital problems. * ? ?Hospital Course: ?66 year old with past medical history significant for hypertension, anxiety disorder, COPD, nicotine dependence who presents complaining of shortness of breath.  He reports shortness of breath for the last 2 days progressively getting worse, to the point of significant shortness of breath with minimal exertion.  He report cough.  He denies chest pain fevers.  EMS was called, oxygen saturation at the time of evaluation was 80% patient was placed on supplemental oxygen.  Received DuoNebs. ?  ?Evaluation in the ED patient was noted to be in respiratory distress thought to be secondary to COPD exacerbation he received nebulizers IV Solu-Medrol and was placed on BiPAP. ?  ?Sequently he was weaned off of BiPAP ? ?Assessment and Plan: ?1-Acute COPD exacerbation: ?Chest x-ray negative for pneumonia. ?He required BiPAP initially in the ED. ?Treated  with nebulizer, doxycycline. ?Treated with V steroids.  ?Smoking cessation counseled ?Started  Brovana, pulmicort, guaifenesin. Resume Dulera at discharge.  ?Flutter valve ?Respiratory culture.  Rare gram-positive cocci in clusters and gram-positive rods.  Final culture report will need to be follow-up.  I spoke with micro so far appears to be growing normal respiratory  flora.  Report will be available tomorrow ?  ?2-Hypokalemia: Replaced orally ?  ?3-Hypertension: ?Continue with Norvasc.  ?PRN hydralazine.  ?  ?4-Hypomagnesemia: Replete IV x2 ?  ?Generalized anxiety disorder: ?Continue with Lexapro.  ?  ?Nicotine dependence: Counseling  provided ?  ?  ? ?  ? ? ?Consultants: None ?Procedures performed: None ?Disposition: Home ?Diet recommendation:  ?Cardiac diet ?DISCHARGE MEDICATION: ?Allergies as of 08/28/2021   ?No Known Allergies ?  ? ?  ?Medication List  ?  ? ?TAKE these medications   ? ?albuterol 108 (90 Base) MCG/ACT inhaler ?Commonly known as: VENTOLIN HFA ?Inhale 2 puffs into the lungs every 4 (four) hours as needed for wheezing or shortness of breath. ?  ?albuterol (2.5 MG/3ML) 0.083% nebulizer solution ?Commonly known as: PROVENTIL ?Take 3 mLs (2.5 mg total) by nebulization every 4 (four) hours as needed for wheezing or shortness of breath. ?  ?amLODipine 5 MG tablet ?Commonly known as: NORVASC ?TAKE 1 TABLET(5 MG) BY MOUTH DAILY ?What changed: See the new instructions. ?  ?doxycycline 100 MG tablet ?Commonly known as: VIBRA-TABS ?Take 1 tablet (100 mg total) by mouth every 12 (twelve) hours for 5 days. ?  ?Dulera 200-5 MCG/ACT Aero ?Generic drug: mometasone-formoterol ?Inhale 2 puffs into the lungs in the morning and at bedtime. ?  ?escitalopram 10 MG tablet ?Commonly known as: LEXAPRO ?TAKE 1 TABLET(10 MG) BY MOUTH DAILY ?What changed: See the new instructions. ?  ?guaiFENesin 600 MG 12 hr tablet ?Commonly known as: [place] ?Take 2 tablets (1,200 mg total) by mouth 2 (two) times daily. ?  ?predniSONE 20 MG tablet ?Commonly known as: DELTASONE ?Take 2 tablets (40 mg total) by mouth daily for 5  days. ?  ?Spiriva HandiHaler 18 MCG inhalation capsule ?Generic drug: tiotropium ?Use on capsule with 2 oral inhalations once per day ?What changed:  ?how much to take ?how to take this ?when to take this ?  ? ?  ? ? Follow-up Information   ? ? Autry-Lott, Simone, DO Follow up in 1  week(s).   ?Specialty: Family Medicine ?Contact information: ?[address] [place] [address] ?[phone] ? ? ?  ?  ? ?  ?  ? ?  ? ?Discharge Exam: ?Filed Weights  ? 08/27/21 0019  ?Weight: 59 kg  ? ?General; NAD ?CVS; S 1, S 2 RRR ?Lung; CTA ? ?Condition at discharge: stable ? ?The results of significant diagnostics from this hospitalization (including imaging, microbiology, ancillary and laboratory) are listed below for reference.  ? ?Imaging Studies: ?DG Chest Port 1 View ? ?Result Date: 08/27/2021 ?CLINICAL DATA:  Shortness of breath and wheezing EXAM: PORTABLE CHEST 1 VIEW COMPARISON:  02/12/2021 FINDINGS: The heart size and mediastinal contours are within normal limits. Both lungs are clear. The visualized skeletal structures are unremarkable. IMPRESSION: No active disease. Electronically Signed   By: Donavan Foil M.D.   On: 08/27/2021 00:54   ? ?Microbiology: ?Results for orders placed or performed during the hospital encounter of 08/27/21  ?Resp Panel by RT-PCR (Flu A&B, Covid) Nasopharyngeal Swab     Status: None  ? Collection Time: 08/27/21 12:27 AM  ? Specimen: Nasopharyngeal Swab; Nasopharyngeal(NP) swabs in vial transport medium  ?Result Value Ref Range Status  ? SARS Coronavirus 2 by RT PCR NEGATIVE NEGATIVE Final  ?  Comment: (NOTE) ?SARS-CoV-2 target nucleic acids are NOT DETECTED. ? ?The SARS-CoV-2 RNA is generally detectable in upper respiratory ?specimens during the acute phase of infection. The lowest ?concentration of SARS-CoV-2 viral copies this assay can detect is ?138 copies/mL. A negative result does not preclude SARS-Cov-2 ?infection and should not be used as the sole basis for treatment or ?other patient management decisions. A negative result may occur with  ?improper specimen collection/handling, submission of specimen other ?than nasopharyngeal swab, presence of viral mutation(s) within the ?areas targeted by this assay, and inadequate number of viral ?copies(<138 copies/mL). A  negative result must be combined with ?clinical observations, patient history, and epidemiological ?information. The expected result is Negative. ? ?Fact Sheet for Patients:  ?EntrepreneurPulse.com.au ? ?Fact Sheet for Healthcare Providers:  ?IncredibleEmployment.be ? ?This test is no t yet approved or cleared by the [place] FDA and  ?has been authorized for detection and/or diagnosis of SARS-CoV-2 by ?FDA under an Emergency Use Authorization (EUA). This EUA will remain  ?in effect (meaning this test can be used) for the duration of the ?COVID-19 declaration under Section 564(b)(1) of the Act, 21 ?U.S.C.section 360bbb-3(b)(1), unless the authorization is terminated  ?or revoked sooner.  ? ? ?  ? Influenza A by PCR NEGATIVE NEGATIVE Final  ? Influenza B by PCR NEGATIVE NEGATIVE Final  ?  Comment: (NOTE) ?The Xpert Xpress SARS-CoV-2/FLU/RSV plus assay is intended as an aid ?in the diagnosis of influenza from Nasopharyngeal swab specimens and ?should not be used as a sole basis for treatment. Nasal washings and ?aspirates are unacceptable for Xpert Xpress SARS-CoV-2/FLU/RSV ?testing. ? ?Fact Sheet for Patients: ?EntrepreneurPulse.com.au ? ?Fact Sheet for Healthcare Providers: ?IncredibleEmployment.be ? ?This test is not yet approved or cleared by the [place] FDA and ?has been authorized for detection and/or diagnosis of SARS-CoV-2 by ?FDA under an Emergency Use Authorization (EUA). This EUA will remain ?  in effect (meaning this test can be used) for the duration of the ?COVID-19 declaration under Section 564(b)(1) of the Act, 21 U.S.C. ?section 360bbb-3(b)(1), unless the authorization is terminated or ?revoked. ? ?Performed at [facility], [address] ?[place], [place] [address] ?  ?Respiratory (~20 pathogens) panel by PCR     Status: None  ? Collection Time: 08/27/21  5:44 AM  ? Specimen: Nasopharyngeal Swab;  Respiratory  ?Result Value Ref Range Status  ? Adenovirus NOT DETECTED NOT DETECTED Final  ? Coronavirus 229E NOT DETECTED NOT DETECTED Final  ?  Comment: (NOTE) ?The Coronavirus on the Respiratory Panel, D

## 2021-08-29 LAB — CULTURE, RESPIRATORY W GRAM STAIN
Culture: NORMAL
Gram Stain: NONE SEEN

## 2021-09-07 ENCOUNTER — Inpatient Hospital Stay: Payer: Medicare Other | Admitting: Family Medicine

## 2021-10-12 ENCOUNTER — Ambulatory Visit (INDEPENDENT_AMBULATORY_CARE_PROVIDER_SITE_OTHER): Payer: Medicare Other | Admitting: Family Medicine

## 2021-10-12 ENCOUNTER — Encounter: Payer: Self-pay | Admitting: Family Medicine

## 2021-10-12 VITALS — BP 90/77 | HR 104 | Ht 65.0 in | Wt 122.4 lb

## 2021-10-12 DIAGNOSIS — R634 Abnormal weight loss: Secondary | ICD-10-CM | POA: Diagnosis not present

## 2021-10-12 DIAGNOSIS — Z720 Tobacco use: Secondary | ICD-10-CM

## 2021-10-12 NOTE — Patient Instructions (Signed)
It was wonderful to see you today.  Please bring ALL of your medications with you to every visit.   Today we talked about:  - Weight loss and we will get labs today. Follow up 2-4 weeks.  - We will get you scheduled for a lung CT exam and give you a call when this is done.   Please be sure to schedule follow up at the front  desk before you leave today.   If you haven't already, sign up for My Chart to have easy access to your labs results, and communication with your primary care physician.  Please call the clinic at [phone] if your symptoms worsen or you have any concerns. It was our pleasure to serve you.  Dr. Janus Molder

## 2021-10-12 NOTE — Progress Notes (Unsigned)
    SUBJECTIVE:   CHIEF COMPLAINT / HPI:   Unintentional weight loss Noticed he has been losing weight despite eating 2-3x/day consistently. He has also experienced occasional diarrhea, night sweats, and time of increased urination. He also endorses a 76 yr tobacco smoking history. He denies fever, nausea, vomiting, abdominal pain, joint aches, and shortness of breath. He also denies urgency and dribbling as far as urinary symptoms. He states his diet is regular and he eats everything without swallowing issues or pain. He will occasionally drink alcohol.   PERTINENT  PMH / PSH: HTN, COPD, Anxiety OBJECTIVE:   BP 90/77   Pulse (!) 104   Ht '5\' 5"'$  (1.651 m)   Wt 122 lb 6.4 oz (55.5 kg)   SpO2 94%   BMI 20.37 kg/m   Physical Exam Vitals reviewed.  Constitutional:      General: He is not in acute distress.    Appearance: He is not ill-appearing, toxic-appearing or diaphoretic.  HENT:     Mouth/Throat:     Dentition: Abnormal dentition. Does not have dentures. No dental caries, dental abscesses or gum lesions.     Pharynx: No oropharyngeal exudate or posterior oropharyngeal erythema.  Eyes:     Conjunctiva/sclera: Conjunctivae normal.  Cardiovascular:     Heart sounds: Normal heart sounds.  Pulmonary:     Effort: Pulmonary effort is normal.     Breath sounds: Normal breath sounds.  Abdominal:     General: Bowel sounds are normal.     Palpations: Abdomen is soft. There is no mass.     Tenderness: There is no abdominal tenderness. There is no right CVA tenderness, left CVA tenderness, guarding or rebound.  Lymphadenopathy:     Cervical: No cervical adenopathy.     Upper Body:     Right upper body: No axillary adenopathy.     Left upper body: No axillary adenopathy.     Lower Body: No right inguinal adenopathy. No left inguinal adenopathy.  Skin:    Findings: No rash.  Neurological:     Mental Status: He is alert and oriented to person, place, and time.     Gait: Gait normal.   Psychiatric:        Mood and Affect: Mood normal.     Comments: Erratic and impatient behavior- left room several times and asking provider and nurse to hurry up with the visit.       ASSESSMENT/PLAN:   1. Weight loss 20lbs in 8 months with unclear etiologies. Will like need a multi-visit approproach. Concern for malignancy at this time will complete work up below and update cancer screenings. Also consider endocrine; thyroid/diabetes. Consider infectious such as HIV. Needs close follow up. Encouraged to follow up in 2-4 weeks or sooner if needed. Will plan to have labs and scans back to discuss at follow up.  - CBC with Differential - Comprehensive metabolic panel - TSH - PSA - HIV antibody (with reflex) - Sedimentation Rate - Hemoglobin A1c - CT CHEST LUNG CA SCREEN LOW DOSE W/O CM; Future  2. Tobacco use Offered resources for cessation. Patient declined. Scan ordered below due the above. - CT CHEST LUNG CA SCREEN LOW DOSE W/O CM; Future  Gerlene Fee, [place]

## 2021-10-13 LAB — CBC WITH DIFFERENTIAL/PLATELET
Basophils Absolute: 0.1 10*3/uL (ref 0.0–0.2)
Basos: 1 %
EOS (ABSOLUTE): 0.1 10*3/uL (ref 0.0–0.4)
Eos: 1 %
Hematocrit: 38.9 % (ref 37.5–51.0)
Hemoglobin: 12.9 g/dL — ABNORMAL LOW (ref 13.0–17.7)
Immature Grans (Abs): 0 10*3/uL (ref 0.0–0.1)
Immature Granulocytes: 0 %
Lymphocytes Absolute: 1.9 10*3/uL (ref 0.7–3.1)
Lymphs: 28 %
MCH: 30.1 pg (ref 26.6–33.0)
MCHC: 33.2 g/dL (ref 31.5–35.7)
MCV: 91 fL (ref 79–97)
Monocytes Absolute: 0.6 10*3/uL (ref 0.1–0.9)
Monocytes: 9 %
Neutrophils Absolute: 4.1 10*3/uL (ref 1.4–7.0)
Neutrophils: 61 %
Platelets: 259 10*3/uL (ref 150–450)
RBC: 4.29 x10E6/uL (ref 4.14–5.80)
RDW: 12.5 % (ref 11.6–15.4)
WBC: 6.8 10*3/uL (ref 3.4–10.8)

## 2021-10-13 LAB — COMPREHENSIVE METABOLIC PANEL
ALT: 12 IU/L (ref 0–44)
AST: 16 IU/L (ref 0–40)
Albumin/Globulin Ratio: 1.8 (ref 1.2–2.2)
Albumin: 3.8 g/dL (ref 3.8–4.8)
Alkaline Phosphatase: 97 IU/L (ref 44–121)
BUN/Creatinine Ratio: 14 (ref 10–24)
BUN: 17 mg/dL (ref 8–27)
Bilirubin Total: 0.3 mg/dL (ref 0.0–1.2)
CO2: 20 mmol/L (ref 20–29)
Calcium: 9.3 mg/dL (ref 8.6–10.2)
Chloride: 105 mmol/L (ref 96–106)
Creatinine, Ser: 1.25 mg/dL (ref 0.76–1.27)
Globulin, Total: 2.1 g/dL (ref 1.5–4.5)
Glucose: 108 mg/dL — ABNORMAL HIGH (ref 70–99)
Potassium: 4 mmol/L (ref 3.5–5.2)
Sodium: 143 mmol/L (ref 134–144)
Total Protein: 5.9 g/dL — ABNORMAL LOW (ref 6.0–8.5)
eGFR: 63 mL/min/{1.73_m2} (ref >59)

## 2021-10-13 LAB — SEDIMENTATION RATE: Sed Rate: 31 mm/hr — ABNORMAL HIGH (ref 0–30)

## 2021-10-13 LAB — HIV ANTIBODY (ROUTINE TESTING W REFLEX): HIV Screen 4th Generation wRfx: NONREACTIVE

## 2021-10-13 LAB — HEMOGLOBIN A1C
Est. average glucose Bld gHb Est-mCnc: 108 mg/dL
Hgb A1c MFr Bld: 5.4 % (ref 4.8–5.6)

## 2021-10-13 LAB — PSA: Prostate Specific Ag, Serum: 4.5 ng/mL — ABNORMAL HIGH (ref 0.0–4.0)

## 2021-10-13 LAB — TSH: TSH: 1.4 u[IU]/mL (ref 0.450–4.500)

## 2021-10-15 ENCOUNTER — Telehealth: Payer: Self-pay

## 2021-10-15 DIAGNOSIS — R634 Abnormal weight loss: Secondary | ICD-10-CM | POA: Insufficient documentation

## 2021-10-15 NOTE — Telephone Encounter (Signed)
Scheduled upcoming appointment for patient.  [facility] 10/05/2021 CT Chest Cancer Screening 1000 with arrival at [name]  Attempted to call patient x two to no avail.  Will try again later.  Ozella Almond, [place]

## 2021-10-16 ENCOUNTER — Telehealth: Payer: Self-pay

## 2021-10-16 NOTE — Telephone Encounter (Signed)
Attempted to reach patient again x 3 for upcoming appointment.  No answer and no ability to leave voice mail.  Called patient's daughter Deetta Perla number listed in chart to no avail as well.  Called patient's sister Val Riles and LVM for patient to call office to retrieve information on his upcoming CT.  Ozella Almond, [place]

## 2021-10-17 ENCOUNTER — Telehealth: Payer: Self-pay | Admitting: Family Medicine

## 2021-10-17 NOTE — Telephone Encounter (Signed)
Attempted to call number provided for patient.  Need to discuss elevated PSA.  History of weight loss concern for possible prostate cancer.  Will need referral to urology.  We will attempt to call patient again prior to placing referral.  Of note nursing staff has attempted to call him several times for his low-dose CT scan.  Nadalyn Deringer Autry-Lott, DO 10/17/2021, 4:42 PM PGY-3, [place]

## 2021-10-23 ENCOUNTER — Inpatient Hospital Stay: Admission: RE | Admit: 2021-10-23 | Payer: Medicare Other | Source: Ambulatory Visit

## 2021-10-25 ENCOUNTER — Telehealth: Payer: Self-pay | Admitting: Family Medicine

## 2021-10-25 ENCOUNTER — Encounter: Payer: Self-pay | Admitting: Family Medicine

## 2021-10-25 NOTE — Telephone Encounter (Signed)
Attempted to call to discuss results again. Unable to leave a voicemail message. Will send a letter.   He needs follow up for possible malignancy.   Gerlene Fee, DO 10/25/2021, 1:22 PM PGY-3, [place]

## 2021-10-29 ENCOUNTER — Inpatient Hospital Stay (HOSPITAL_COMMUNITY)
Admission: EM | Admit: 2021-10-29 | Discharge: 2021-10-31 | DRG: 190 | Disposition: A | Discharge status: Home / Self Care | Payer: Medicare Other | Attending: Family Medicine | Admitting: Family Medicine

## 2021-10-29 ENCOUNTER — Other Ambulatory Visit: Payer: Self-pay

## 2021-10-29 ENCOUNTER — Encounter (HOSPITAL_COMMUNITY): Payer: Self-pay | Admitting: Emergency Medicine

## 2021-10-29 ENCOUNTER — Emergency Department (HOSPITAL_COMMUNITY): Payer: Medicare Other

## 2021-10-29 DIAGNOSIS — F1010 Alcohol abuse, uncomplicated: Secondary | ICD-10-CM | POA: Diagnosis present

## 2021-10-29 DIAGNOSIS — F411 Generalized anxiety disorder: Secondary | ICD-10-CM | POA: Diagnosis present

## 2021-10-29 DIAGNOSIS — F17210 Nicotine dependence, cigarettes, uncomplicated: Secondary | ICD-10-CM | POA: Diagnosis present

## 2021-10-29 DIAGNOSIS — Z79899 Other long term (current) drug therapy: Secondary | ICD-10-CM

## 2021-10-29 DIAGNOSIS — Z7951 Long term (current) use of inhaled steroids: Secondary | ICD-10-CM

## 2021-10-29 DIAGNOSIS — I248 Other forms of acute ischemic heart disease: Secondary | ICD-10-CM | POA: Diagnosis present

## 2021-10-29 DIAGNOSIS — Z7141 Alcohol abuse counseling and surveillance of alcoholic: Secondary | ICD-10-CM

## 2021-10-29 DIAGNOSIS — J441 Chronic obstructive pulmonary disease with (acute) exacerbation: Principal | ICD-10-CM | POA: Diagnosis present

## 2021-10-29 DIAGNOSIS — J9601 Acute respiratory failure with hypoxia: Secondary | ICD-10-CM | POA: Diagnosis present

## 2021-10-29 DIAGNOSIS — I2489 Other forms of acute ischemic heart disease: Secondary | ICD-10-CM

## 2021-10-29 DIAGNOSIS — Z716 Tobacco abuse counseling: Secondary | ICD-10-CM

## 2021-10-29 DIAGNOSIS — E876 Hypokalemia: Secondary | ICD-10-CM

## 2021-10-29 DIAGNOSIS — I161 Hypertensive emergency: Secondary | ICD-10-CM

## 2021-10-29 DIAGNOSIS — R739 Hyperglycemia, unspecified: Secondary | ICD-10-CM

## 2021-10-29 DIAGNOSIS — Z20822 Contact with and (suspected) exposure to covid-19: Secondary | ICD-10-CM | POA: Diagnosis present

## 2021-10-29 DIAGNOSIS — I10 Essential (primary) hypertension: Secondary | ICD-10-CM | POA: Diagnosis present

## 2021-10-29 LAB — BLOOD GAS, ARTERIAL
Acid-Base Excess: 6.3 mmol/L — ABNORMAL HIGH (ref 0.0–2.0)
Bicarbonate: 32.3 mmol/L — ABNORMAL HIGH (ref 20.0–28.0)
Expiratory PAP: 6 cmH2O
FIO2: 60 %
Inspiratory PAP: 16 cmH2O
Mode: POSITIVE
O2 Saturation: 100 %
Patient temperature: 37
RATE: 16 resp/min
pCO2 arterial: 51 mmHg — ABNORMAL HIGH (ref 32–48)
pH, Arterial: 7.41 (ref 7.35–7.45)
pO2, Arterial: 277 mmHg — ABNORMAL HIGH (ref 83–108)

## 2021-10-29 LAB — BASIC METABOLIC PANEL
Anion gap: 10 (ref 5–15)
BUN: 10 mg/dL (ref 8–23)
CO2: 27 mmol/L (ref 22–32)
Calcium: 8.9 mg/dL (ref 8.9–10.3)
Chloride: 105 mmol/L (ref 98–111)
Creatinine, Ser: 0.7 mg/dL (ref 0.61–1.24)
GFR, Estimated: >60 mL/min (ref >60)
Glucose, Bld: 163 mg/dL — ABNORMAL HIGH (ref 70–99)
Potassium: 3.3 mmol/L — ABNORMAL LOW (ref 3.5–5.1)
Sodium: 142 mmol/L (ref 135–145)

## 2021-10-29 LAB — CBC WITH DIFFERENTIAL/PLATELET
Abs Immature Granulocytes: 0.04 10*3/uL (ref 0.00–0.07)
Basophils Absolute: 0.1 10*3/uL (ref 0.0–0.1)
Basophils Relative: 1 %
Eosinophils Absolute: 0.1 10*3/uL (ref 0.0–0.5)
Eosinophils Relative: 1 %
HCT: 40.9 % (ref 39.0–52.0)
Hemoglobin: 13.3 g/dL (ref 13.0–17.0)
Immature Granulocytes: 0 %
Lymphocytes Relative: 45 %
Lymphs Abs: 4.1 10*3/uL — ABNORMAL HIGH (ref 0.7–4.0)
MCH: 29.8 pg (ref 26.0–34.0)
MCHC: 32.5 g/dL (ref 30.0–36.0)
MCV: 91.7 fL (ref 80.0–100.0)
Monocytes Absolute: 0.9 10*3/uL (ref 0.1–1.0)
Monocytes Relative: 10 %
Neutro Abs: 3.9 10*3/uL (ref 1.7–7.7)
Neutrophils Relative %: 43 %
Platelets: 243 10*3/uL (ref 150–400)
RBC: 4.46 MIL/uL (ref 4.22–5.81)
RDW: 14 % (ref 11.5–15.5)
WBC: 9.1 10*3/uL (ref 4.0–10.5)
nRBC: 0 % (ref 0.0–0.2)

## 2021-10-29 LAB — MAGNESIUM: Magnesium: 2.2 mg/dL (ref 1.7–2.4)

## 2021-10-29 LAB — TROPONIN I (HIGH SENSITIVITY)
Troponin I (High Sensitivity): 21 ng/L — ABNORMAL HIGH (ref <18)
Troponin I (High Sensitivity): 200 ng/L (ref <18)
Troponin I (High Sensitivity): 311 ng/L (ref <18)
Troponin I (High Sensitivity): 358 ng/L (ref <18)

## 2021-10-29 LAB — RESP PANEL BY RT-PCR (FLU A&B, COVID) ARPGX2
Influenza A by PCR: NEGATIVE
Influenza B by PCR: NEGATIVE
SARS Coronavirus 2 by RT PCR: NEGATIVE

## 2021-10-29 MED ORDER — PREDNISONE 20 MG PO TABS
40.0000 mg | ORAL_TABLET | Freq: Every day | ORAL | Status: DC
  Administered 2021-10-31: 40 mg via ORAL
  Filled 2021-10-29 (×2): qty 2

## 2021-10-29 MED ORDER — ESCITALOPRAM OXALATE 10 MG PO TABS
10.0000 mg | ORAL_TABLET | Freq: Every morning | ORAL | Status: DC
Start: 2021-10-29 — End: 2021-10-31
  Administered 2021-10-29 – 2021-10-31 (×3): 10 mg via ORAL
  Filled 2021-10-29 (×3): qty 1

## 2021-10-29 MED ORDER — ENOXAPARIN SODIUM 40 MG/0.4ML IJ SOSY
40.0000 mg | PREFILLED_SYRINGE | INTRAMUSCULAR | Status: DC
  Administered 2021-10-29 – 2021-10-30 (×2): 40 mg via SUBCUTANEOUS
  Filled 2021-10-29 (×2): qty 0.4

## 2021-10-29 MED ORDER — LORAZEPAM 2 MG/ML IJ SOLN
INTRAMUSCULAR | Status: AC
  Administered 2021-10-29: 0.5 mg via INTRAVENOUS
  Filled 2021-10-29: qty 1

## 2021-10-29 MED ORDER — ACETAMINOPHEN 325 MG PO TABS: 650.0000 mg | ORAL_TABLET | Freq: Four times a day (QID) | ORAL | Status: DC | PRN

## 2021-10-29 MED ORDER — METHYLPREDNISOLONE SODIUM SUCC 125 MG IJ SOLR
125.0000 mg | Freq: Once | INTRAMUSCULAR | Status: AC
  Administered 2021-10-29: 125 mg via INTRAVENOUS
  Filled 2021-10-29: qty 2

## 2021-10-29 MED ORDER — POTASSIUM CHLORIDE CRYS ER 20 MEQ PO TBCR
40.0000 meq | EXTENDED_RELEASE_TABLET | Freq: Every day | ORAL | Status: DC
  Administered 2021-10-29: 40 meq via ORAL
  Filled 2021-10-29: qty 2

## 2021-10-29 MED ORDER — AMLODIPINE BESYLATE 5 MG PO TABS
5.0000 mg | ORAL_TABLET | Freq: Every morning | ORAL | Status: DC
  Administered 2021-10-29 – 2021-10-31 (×3): 5 mg via ORAL
  Filled 2021-10-29 (×3): qty 1

## 2021-10-29 MED ORDER — LORAZEPAM 2 MG/ML IJ SOLN
0.5000 mg | Freq: Four times a day (QID) | INTRAMUSCULAR | Status: DC | PRN
Start: 2021-10-29 — End: 2021-10-30
  Administered 2021-10-29 – 2021-10-30 (×2): 0.5 mg via INTRAVENOUS
  Filled 2021-10-29 (×2): qty 1

## 2021-10-29 MED ORDER — ACETAMINOPHEN 650 MG RE SUPP: 650.0000 mg | Freq: Four times a day (QID) | RECTAL | Status: DC | PRN

## 2021-10-29 MED ORDER — METHYLPREDNISOLONE SODIUM SUCC 40 MG IJ SOLR
40.0000 mg | Freq: Two times a day (BID) | INTRAMUSCULAR | Status: AC
  Administered 2021-10-29 – 2021-10-30 (×2): 40 mg via INTRAVENOUS
  Filled 2021-10-29 (×2): qty 1

## 2021-10-29 MED ORDER — IPRATROPIUM-ALBUTEROL 0.5-2.5 (3) MG/3ML IN SOLN
3.0000 mL | Freq: Once | RESPIRATORY_TRACT | Status: AC
  Administered 2021-10-29: 3 mL via RESPIRATORY_TRACT
  Filled 2021-10-29: qty 3

## 2021-10-29 MED ORDER — IPRATROPIUM-ALBUTEROL 0.5-2.5 (3) MG/3ML IN SOLN
3.0000 mL | Freq: Four times a day (QID) | RESPIRATORY_TRACT | Status: DC
  Administered 2021-10-29 – 2021-10-31 (×8): 3 mL via RESPIRATORY_TRACT
  Filled 2021-10-29 (×9): qty 3

## 2021-10-29 MED ORDER — LORAZEPAM 2 MG/ML IJ SOLN: 0.5000 mg | Freq: Once | INTRAMUSCULAR | Status: AC

## 2021-10-29 NOTE — ED Triage Notes (Signed)
BIB [facility] Per EMS: Pt coming from home w/ c/o SHOB. Hx COPD. Pt sats at 90% RA w/ diminished lung sounds upon EMS arrival. Pt given 10mg  albuterol, 1mg  Atrovent, 125mg  solumedrol, and Mag en route. Pt tripoding in triage. MD at bedside  Respiratory called for BiPAP

## 2021-10-29 NOTE — Progress Notes (Signed)
ABG complete, sent to lab and called

## 2021-10-29 NOTE — Progress Notes (Signed)
No page received for troponin of 311.  No new chest pain. Echo pending.

## 2021-10-29 NOTE — ED Notes (Signed)
Upon pt arrival, MD Messick at bedside. RT called due to respiratory distress. BiPAP placed on pt by RT.

## 2021-10-30 ENCOUNTER — Encounter (HOSPITAL_COMMUNITY): Payer: Self-pay | Admitting: Internal Medicine

## 2021-10-30 ENCOUNTER — Observation Stay (HOSPITAL_COMMUNITY): Payer: Medicare Other

## 2021-10-30 ENCOUNTER — Inpatient Hospital Stay (HOSPITAL_COMMUNITY): Payer: Medicare Other

## 2021-10-30 DIAGNOSIS — F17210 Nicotine dependence, cigarettes, uncomplicated: Secondary | ICD-10-CM | POA: Diagnosis present

## 2021-10-30 DIAGNOSIS — R079 Chest pain, unspecified: Secondary | ICD-10-CM | POA: Diagnosis not present

## 2021-10-30 DIAGNOSIS — Z7141 Alcohol abuse counseling and surveillance of alcoholic: Secondary | ICD-10-CM | POA: Diagnosis not present

## 2021-10-30 DIAGNOSIS — I10 Essential (primary) hypertension: Secondary | ICD-10-CM

## 2021-10-30 DIAGNOSIS — I248 Other forms of acute ischemic heart disease: Secondary | ICD-10-CM | POA: Diagnosis present

## 2021-10-30 DIAGNOSIS — R739 Hyperglycemia, unspecified: Secondary | ICD-10-CM | POA: Diagnosis present

## 2021-10-30 DIAGNOSIS — F411 Generalized anxiety disorder: Secondary | ICD-10-CM

## 2021-10-30 DIAGNOSIS — E876 Hypokalemia: Secondary | ICD-10-CM | POA: Diagnosis present

## 2021-10-30 DIAGNOSIS — I161 Hypertensive emergency: Secondary | ICD-10-CM | POA: Diagnosis present

## 2021-10-30 DIAGNOSIS — Z7951 Long term (current) use of inhaled steroids: Secondary | ICD-10-CM | POA: Diagnosis not present

## 2021-10-30 DIAGNOSIS — J441 Chronic obstructive pulmonary disease with (acute) exacerbation: Secondary | ICD-10-CM | POA: Diagnosis present

## 2021-10-30 DIAGNOSIS — Z79899 Other long term (current) drug therapy: Secondary | ICD-10-CM | POA: Diagnosis not present

## 2021-10-30 DIAGNOSIS — J9601 Acute respiratory failure with hypoxia: Secondary | ICD-10-CM | POA: Diagnosis present

## 2021-10-30 DIAGNOSIS — Z20822 Contact with and (suspected) exposure to covid-19: Secondary | ICD-10-CM | POA: Diagnosis present

## 2021-10-30 DIAGNOSIS — F1010 Alcohol abuse, uncomplicated: Secondary | ICD-10-CM | POA: Diagnosis present

## 2021-10-30 DIAGNOSIS — I700 Atherosclerosis of aorta: Secondary | ICD-10-CM | POA: Diagnosis not present

## 2021-10-30 DIAGNOSIS — Z716 Tobacco abuse counseling: Secondary | ICD-10-CM | POA: Diagnosis not present

## 2021-10-30 LAB — TROPONIN I (HIGH SENSITIVITY)
Troponin I (High Sensitivity): 369 ng/L (ref <18)
Troponin I (High Sensitivity): 411 ng/L (ref <18)

## 2021-10-30 LAB — ECHOCARDIOGRAM COMPLETE
Area-P 1/2: 5.25 cm2
Calc EF: 68.2 %
Height: 65 in
S' Lateral: 2 cm
Single Plane A2C EF: 69.3 %
Single Plane A4C EF: 69.9 %
Weight: 1894.19 oz

## 2021-10-30 LAB — COMPREHENSIVE METABOLIC PANEL
ALT: 18 U/L (ref 0–44)
AST: 26 U/L (ref 15–41)
Albumin: 3.5 g/dL (ref 3.5–5.0)
Alkaline Phosphatase: 74 U/L (ref 38–126)
Anion gap: 7 (ref 5–15)
BUN: 15 mg/dL (ref 8–23)
CO2: 27 mmol/L (ref 22–32)
Calcium: 9.4 mg/dL (ref 8.9–10.3)
Chloride: 107 mmol/L (ref 98–111)
Creatinine, Ser: 0.73 mg/dL (ref 0.61–1.24)
GFR, Estimated: >60 mL/min (ref >60)
Glucose, Bld: 155 mg/dL — ABNORMAL HIGH (ref 70–99)
Potassium: 4.1 mmol/L (ref 3.5–5.1)
Sodium: 141 mmol/L (ref 135–145)
Total Bilirubin: 0.9 mg/dL (ref 0.3–1.2)
Total Protein: 6.1 g/dL — ABNORMAL LOW (ref 6.5–8.1)

## 2021-10-30 LAB — CBC
HCT: 35.8 % — ABNORMAL LOW (ref 39.0–52.0)
Hemoglobin: 12.2 g/dL — ABNORMAL LOW (ref 13.0–17.0)
MCH: 30.5 pg (ref 26.0–34.0)
MCHC: 34.1 g/dL (ref 30.0–36.0)
MCV: 89.5 fL (ref 80.0–100.0)
Platelets: 202 10*3/uL (ref 150–400)
RBC: 4 MIL/uL — ABNORMAL LOW (ref 4.22–5.81)
RDW: 13.2 % (ref 11.5–15.5)
WBC: 5.5 10*3/uL (ref 4.0–10.5)
nRBC: 0 % (ref 0.0–0.2)

## 2021-10-30 LAB — HEMOGLOBIN A1C
Hgb A1c MFr Bld: 5.5 % (ref 4.8–5.6)
Mean Plasma Glucose: 111 mg/dL

## 2021-10-30 MED ORDER — IOHEXOL 350 MG/ML SOLN
100.0000 mL | Freq: Once | INTRAVENOUS | Status: AC | PRN
  Administered 2021-10-30: 100 mL via INTRAVENOUS

## 2021-10-30 MED ORDER — ADULT MULTIVITAMIN W/MINERALS CH
1.0000 | ORAL_TABLET | Freq: Every day | ORAL | Status: DC
Start: 2021-10-30 — End: 2021-10-31
  Administered 2021-10-30 – 2021-10-31 (×2): 1 via ORAL
  Filled 2021-10-30 (×2): qty 1

## 2021-10-30 MED ORDER — LORAZEPAM 2 MG/ML IJ SOLN: 1.0000 mg | INTRAMUSCULAR | Status: DC | PRN

## 2021-10-30 MED ORDER — DILTIAZEM HCL 25 MG/5ML IV SOLN
10.0000 mg | Freq: Once | INTRAVENOUS | Status: AC
  Administered 2021-10-30: 10 mg via INTRAVENOUS
  Filled 2021-10-30: qty 5

## 2021-10-30 MED ORDER — LORAZEPAM 2 MG/ML IJ SOLN: 0.0000 mg | Freq: Two times a day (BID) | INTRAMUSCULAR | Status: DC

## 2021-10-30 MED ORDER — LORAZEPAM 1 MG PO TABS: 1.0000 mg | ORAL_TABLET | ORAL | Status: DC | PRN

## 2021-10-30 MED ORDER — NITROGLYCERIN 0.4 MG SL SUBL
0.8000 mg | SUBLINGUAL_TABLET | SUBLINGUAL | Status: DC | PRN
  Administered 2021-10-30: 0.8 mg via SUBLINGUAL

## 2021-10-30 MED ORDER — AZITHROMYCIN 250 MG PO TABS
500.0000 mg | ORAL_TABLET | Freq: Every day | ORAL | Status: DC
Start: 2021-10-30 — End: 2021-10-31
  Administered 2021-10-30 – 2021-10-31 (×2): 500 mg via ORAL
  Filled 2021-10-30 (×2): qty 2

## 2021-10-30 MED ORDER — THIAMINE HCL 100 MG/ML IJ SOLN
100.0000 mg | Freq: Every day | INTRAMUSCULAR | Status: DC
Start: 2021-10-30 — End: 2021-10-31

## 2021-10-30 MED ORDER — DILTIAZEM HCL 25 MG/5ML IV SOLN
10.0000 mg | Freq: Once | INTRAVENOUS | Status: AC
  Administered 2021-10-30: 10 mg via INTRAVENOUS

## 2021-10-30 MED ORDER — IVABRADINE HCL 5 MG PO TABS
10.0000 mg | ORAL_TABLET | ORAL | Status: AC
  Administered 2021-10-30: 10 mg via ORAL
  Filled 2021-10-30: qty 2

## 2021-10-30 MED ORDER — FOLIC ACID 1 MG PO TABS
1.0000 mg | ORAL_TABLET | Freq: Every day | ORAL | Status: DC
Start: 2021-10-30 — End: 2021-10-31
  Administered 2021-10-30 – 2021-10-31 (×2): 1 mg via ORAL
  Filled 2021-10-30 (×2): qty 1

## 2021-10-30 MED ORDER — LORAZEPAM 2 MG/ML IJ SOLN
0.0000 mg | Freq: Four times a day (QID) | INTRAMUSCULAR | Status: DC
  Administered 2021-10-30: 2 mg via INTRAVENOUS
  Filled 2021-10-30: qty 1

## 2021-10-30 MED ORDER — SPIRONOLACTONE 25 MG PO TABS
25.0000 mg | ORAL_TABLET | Freq: Every day | ORAL | Status: DC
Start: 2021-10-30 — End: 2021-10-31
  Administered 2021-10-30 – 2021-10-31 (×2): 25 mg via ORAL
  Filled 2021-10-30 (×2): qty 1

## 2021-10-30 MED ORDER — THIAMINE HCL 100 MG PO TABS
100.0000 mg | ORAL_TABLET | Freq: Every day | ORAL | Status: DC
  Administered 2021-10-30 – 2021-10-31 (×2): 100 mg via ORAL
  Filled 2021-10-30 (×2): qty 1

## 2021-10-31 DIAGNOSIS — J441 Chronic obstructive pulmonary disease with (acute) exacerbation: Secondary | ICD-10-CM | POA: Diagnosis not present

## 2021-10-31 DIAGNOSIS — E876 Hypokalemia: Secondary | ICD-10-CM

## 2021-10-31 DIAGNOSIS — I10 Essential (primary) hypertension: Secondary | ICD-10-CM | POA: Diagnosis not present

## 2021-10-31 DIAGNOSIS — F411 Generalized anxiety disorder: Secondary | ICD-10-CM | POA: Diagnosis not present

## 2021-10-31 DIAGNOSIS — I248 Other forms of acute ischemic heart disease: Secondary | ICD-10-CM | POA: Diagnosis not present

## 2021-10-31 LAB — BASIC METABOLIC PANEL
Anion gap: 8 (ref 5–15)
BUN: 21 mg/dL (ref 8–23)
CO2: 27 mmol/L (ref 22–32)
Calcium: 9.5 mg/dL (ref 8.9–10.3)
Chloride: 107 mmol/L (ref 98–111)
Creatinine, Ser: 0.83 mg/dL (ref 0.61–1.24)
GFR, Estimated: >60 mL/min (ref >60)
Glucose, Bld: 108 mg/dL — ABNORMAL HIGH (ref 70–99)
Potassium: 3.7 mmol/L (ref 3.5–5.1)
Sodium: 142 mmol/L (ref 135–145)

## 2021-10-31 LAB — LIPID PANEL
Cholesterol: 199 mg/dL (ref 0–200)
HDL: 96 mg/dL (ref >40)
LDL Cholesterol: 96 mg/dL (ref 0–99)
Total CHOL/HDL Ratio: 2.1 RATIO
Triglycerides: 35 mg/dL (ref <150)
VLDL: 7 mg/dL (ref 0–40)

## 2021-10-31 LAB — CBC WITH DIFFERENTIAL/PLATELET
Abs Immature Granulocytes: 0.06 10*3/uL (ref 0.00–0.07)
Basophils Absolute: 0 10*3/uL (ref 0.0–0.1)
Basophils Relative: 0 %
Eosinophils Absolute: 0 10*3/uL (ref 0.0–0.5)
Eosinophils Relative: 0 %
HCT: 38.1 % — ABNORMAL LOW (ref 39.0–52.0)
Hemoglobin: 12.6 g/dL — ABNORMAL LOW (ref 13.0–17.0)
Immature Granulocytes: 1 %
Lymphocytes Relative: 13 %
Lymphs Abs: 1.5 10*3/uL (ref 0.7–4.0)
MCH: 29.7 pg (ref 26.0–34.0)
MCHC: 33.1 g/dL (ref 30.0–36.0)
MCV: 89.9 fL (ref 80.0–100.0)
Monocytes Absolute: 1 10*3/uL (ref 0.1–1.0)
Monocytes Relative: 9 %
Neutro Abs: 8.9 10*3/uL — ABNORMAL HIGH (ref 1.7–7.7)
Neutrophils Relative %: 77 %
Platelets: 225 10*3/uL (ref 150–400)
RBC: 4.24 MIL/uL (ref 4.22–5.81)
RDW: 13.6 % (ref 11.5–15.5)
WBC: 11.5 10*3/uL — ABNORMAL HIGH (ref 4.0–10.5)
nRBC: 0 % (ref 0.0–0.2)

## 2021-10-31 MED ORDER — PREDNISONE 20 MG PO TABS: 40.0000 mg | ORAL_TABLET | Freq: Every day | ORAL | 0 refills | Status: AC

## 2021-10-31 MED ORDER — AZITHROMYCIN 250 MG PO TABS
250.0000 mg | ORAL_TABLET | Freq: Every day | ORAL | 0 refills | Status: AC
Start: 2021-10-31 — End: 2021-11-03

## 2021-10-31 MED ORDER — SPIRONOLACTONE 25 MG PO TABS: 25.0000 mg | ORAL_TABLET | Freq: Every day | ORAL | 1 refills | Status: DC

## 2021-10-31 NOTE — Discharge Summary (Signed)
Physician Discharge Summary   Patient: Gavin Kane MRN: [id number] DOB: 05/28/1955  Admit date:     10/29/2021  Discharge date: 10/31/21  Discharge Physician: Patrecia Pour   PCP: Gerlene Fee, DO   Recommendations at discharge:  Smoking cessation counseling and medication assistance recommended. Recheck BP and BMP at follow up having started spironolactone. Consider PFTs, pulmonary evaluation as outpatient.   Discharge Diagnoses: Active Problems:   Hypokalemia   Essential hypertension   Generalized anxiety disorder   Nicotine dependence, cigarettes, uncomplicated   Hyperglycemia   COPD exacerbation ([place])   Demand ischemia (H[facility])   Hypertensive emergency   COPD with acute exacerbation [facility])  Hospital Course: Gavin Kane is a 66 y.o. male with medical history significant for COPD, tobacco use, alcohol abuse, HTN, anxiety who presented to the ED with chest pain and dyspnea found to be in respiratory distress due to COPD exacerbation. Also with elevated troponin, hypertension for which cardiology was consulted and the patient admitted. Symptoms significantly improved with steroids, antibiotics, and nebulized therapies, and the patient is liberated from supplemental oxygen on 6/28.   Assessment and Plan: Acute hypoxic respiratory failure due to AECOPD: Weaned from supplemental oxygen on day of discharge with exertion.  - Complete 5 day course of prednisone, azithromycin, and continue nebs at home. Has supplies and meds already.    HTN with hypertensive emergency: Troponin elevation suspected to be due to this.  - BP controlled on norvasc with addition of spironolactone.    Elevated troponin: Due to demand myocardial ischemia, ACS ruled out. Coronary CTA revealed 25% RCA stenosis without significant obstructive CAD. No coronary calcium.  - Risk factor modification advised.    Alcohol abuse: Fortunately, no evidence of withdrawal at this time.  - Cessation counseling provided  on day of discharge.    Hyperglycemia: A1c 5.5   Anxiety: Quiescent.   Tobacco use:  - Significant counseling provided on day of discharge regarding need to stop with daughter present by speaker phone assisting with motivational interviewing. Defer consideration of medication assistance to PCP as pt remains currently precontemplative.    Consultants: Cardiology Procedures performed: Coronary CTA  Disposition: Home Diet recommendation:  Discharge Diet Orders (From admission, onward)     Start     Ordered   10/31/21 0000  Diet - low sodium heart healthy        10/31/21 1217           DISCHARGE MEDICATION: Allergies as of 10/31/2021   No Known Allergies      Medication List     TAKE these medications    Advil 200 MG tablet Generic drug: ibuprofen Take 200 mg by mouth 2 (two) times daily as needed for headache.   albuterol 108 (90 Base) MCG/ACT inhaler Commonly known as: VENTOLIN HFA Inhale 2 puffs into the lungs every 4 (four) hours as needed for wheezing or shortness of breath.   albuterol (2.5 MG/3ML) 0.083% nebulizer solution Commonly known as: PROVENTIL Take 3 mLs (2.5 mg total) by nebulization every 4 (four) hours as needed for wheezing or shortness of breath.   amLODipine 5 MG tablet Commonly known as: NORVASC TAKE 1 TABLET(5 MG) BY MOUTH DAILY What changed: See the new instructions.   azithromycin 250 MG tablet Commonly known as: ZITHROMAX Take 1 tablet (250 mg total) by mouth daily for 3 days.   Dulera 200-5 MCG/ACT Aero Generic drug: mometasone-formoterol Inhale 2 puffs into the lungs in the morning and at bedtime.   escitalopram 10  MG tablet Commonly known as: LEXAPRO TAKE 1 TABLET(10 MG) BY MOUTH DAILY What changed: See the new instructions.   guaiFENesin 600 MG 12 hr tablet Commonly known as: MUCINEX Take 2 tablets (1,200 mg total) by mouth 2 (two) times daily.   predniSONE 20 MG tablet Commonly known as: DELTASONE Take 2 tablets (40 mg  total) by mouth daily with breakfast for 3 days. Start taking on: November 01, 2021   Spiriva HandiHaler 18 MCG inhalation capsule Generic drug: tiotropium Use on capsule with 2 oral inhalations once per day What changed:  how much to take how to take this when to take this additional instructions   spironolactone 25 MG tablet Commonly known as: ALDACTONE Take 1 tablet (25 mg total) by mouth daily. Start taking on: November 01, 2021        Follow-up Information     Autry-Lott, Naaman Plummer, DO Follow up.   Specialty: Family Medicine Contact information: [address] [address] [phone]                Discharge Exam: [name] Weights   10/29/21 1210  Weight: 53.7 kg  BP (!) 141/73   Pulse 75   Temp 98.1 F (36.7 C) (Oral)   Resp (!) 21   Ht '5\' 5"'$  (1.651 m)   Wt 53.7 kg   SpO2 100%   BMI 19.70 kg/m   Nontoxic RRR, no MRG or edema Clear, nonlabored  Condition at discharge: stable  The results of significant diagnostics from this hospitalization (including imaging, microbiology, ancillary and laboratory) are listed below for reference.   Imaging Studies: CT CORONARY MORPH W/CTA COR W/SCORE W/CA W/CM &/OR WO/CM  Addendum Date: 10/30/2021   ADDENDUM REPORT: 10/30/2021 17:47 CLINICAL DATA:  12M with hypertension, COPD and tobacco abuse admitted with COPD exacerbation, elevated troponin and chest pain. EXAM: Cardiac/Coronary  CT TECHNIQUE: The patient was scanned on a [employer]. FINDINGS: A 120 kV prospective scan was triggered in the descending thoracic aorta at 111 HU's. Axial non-contrast 3 mm slices were carried out through the heart. The data set was analyzed on a dedicated work station and scored using the [place]. Gantry rotation speed was 250 msecs and collimation was .6 mm. No beta blockade and 0.8 mg of sl NTG was given. The 3D data set was reconstructed in 5% intervals of the 67-82 % of the R-R cycle. Diastolic phases were  analyzed on a dedicated work station using MPR, MIP and VRT modes. The patient received 80 cc of contrast. Aorta: Normal size. Ascending aorta 3.2 cm. Aortic atherosclerosis. No dissection. Aortic Valve:  Trileaflet.  No calcifications. Coronary Arteries:  Normal coronary origin.  Right dominance. RCA is a large dominant artery that gives rise to PDA and PLVB. There is minimal (<25%) soft plaque in the distal RCA. Left main is a large artery that gives rise to LAD and LCX arteries. LAD is a large vessel that has no plaque. There is a large D1 without plaque. LCX is a non-dominant artery that gives rise to large OM1 and OM2 branches with a small OM3. There is no plaque. Coronary Calcium Score: 0 Other findings: Normal pulmonary vein drainage into the left atrium. Normal let atrial appendage without a thrombus. Normal size of the pulmonary artery. IMPRESSION: 1. Coronary calcium score of 0. This was 0 percentile for age-, race-, and sex-matched controls. 2. Normal coronary origin with right dominance. 3. Minimal (<25%) plaque in the distal RCA.  CAD-RADS 1.  4. Aortic atherosclerosis. 5.  Consider non-cardiac causes of chest pain. Skeet Latch, MD Electronically Signed   By: Skeet Latch M.D.   On: 10/30/2021 17:47   Result Date: 10/30/2021 EXAM: OVER-READ INTERPRETATION  CT CHEST The following report is a limited chest CT over-read performed by radiologist Dr. Rolm Baptise of [facility], [place] on 10/30/2021. This over-read does not include interpretation of cardiac or coronary anatomy or pathology. The coronary CTA interpretation by the cardiologist is attached. COMPARISON:  None Available. FINDINGS: Vascular: Heart is normal size. Aorta normal caliber. Scattered calcifications in the descending thoracic aorta distally. Mediastinum/Nodes: No adenopathy Lungs/Pleura: Dependent atelectasis at the right lung base. No additional confluent opacities or effusions. Upper Abdomen: No acute findings  Musculoskeletal: Chest wall soft tissues are unremarkable. No acute bony abnormality. IMPRESSION: No acute extra cardiac abnormality. Scattered distal descending thoracic aortic atherosclerosis. Electronically Signed: By: Rolm Baptise M.D. On: 10/30/2021 17:10   ECHOCARDIOGRAM COMPLETE  Result Date: 10/30/2021    ECHOCARDIOGRAM REPORT   Patient Name:   TERON BLAIS Date of Exam: 10/30/2021 Medical Rec #:  [id number]    Height:       65.0 in Accession #:    [id number]   Weight:       118.4 lb Date of Birth:  04/06/1956    BSA:          1.583 m Patient Age:    32 years     BP:           150/99 mmHg Patient Gender: M            HR:           95 bpm. Exam Location:  Inpatient Procedure: 2D Echo, Cardiac Doppler and Color Doppler Indications:    R07.9* Chest pain, unspecified. Elevated troponin  History:        Patient has prior history of Echocardiogram examinations, most                 recent 01/10/2016. Signs/Symptoms:Dyspnea and Shortness of Breath;                 Risk Factors:Hypertension and Current Smoker.  Sonographer:    Roseanna Rainbow RDCS Referring Phys: [id number] Jonnie Finner  Sonographer Comments: Technically difficult study due to poor echo windows and suboptimal parasternal window. Extremely low windows. IMPRESSIONS  1. Left ventricular ejection fraction, by estimation, is 65 to 70%. The left ventricle has normal function. The left ventricle has no regional wall motion abnormalities. Left ventricular diastolic parameters are consistent with Grade I diastolic dysfunction (impaired relaxation).  2. Right ventricular systolic function is normal. The right ventricular size is normal. There is normal pulmonary artery systolic pressure.  3. The mitral valve is normal in structure. No evidence of mitral valve regurgitation. No evidence of mitral stenosis.  4. The aortic valve is normal in structure. Aortic valve regurgitation is not visualized. No aortic stenosis is present.  5. The inferior vena cava is normal in  size with greater than 50% respiratory variability, suggesting right atrial pressure of 3 mmHg. Comparison(s): Prior images unable to be directly viewed, comparison made by report only. FINDINGS  Left Ventricle: Left ventricular ejection fraction, by estimation, is 65 to 70%. The left ventricle has normal function. The left ventricle has no regional wall motion abnormalities. The left ventricular internal cavity size was normal in size. There is  no left ventricular hypertrophy. Left ventricular diastolic parameters are consistent with Grade I diastolic dysfunction (  impaired relaxation). Normal left ventricular filling pressure. Right Ventricle: The right ventricular size is normal. No increase in right ventricular wall thickness. Right ventricular systolic function is normal. There is normal pulmonary artery systolic pressure. The tricuspid regurgitant velocity is 2.53 m/s, and  with an assumed right atrial pressure of 8 mmHg, the estimated right ventricular systolic pressure is 25.3 mmHg. Left Atrium: Left atrial size was normal in size. Right Atrium: Right atrial size was normal in size. Pericardium: There is no evidence of pericardial effusion. Mitral Valve: The mitral valve is normal in structure. No evidence of mitral valve regurgitation. No evidence of mitral valve stenosis. Tricuspid Valve: The tricuspid valve is normal in structure. Tricuspid valve regurgitation is not demonstrated. No evidence of tricuspid stenosis. Aortic Valve: The aortic valve is normal in structure. Aortic valve regurgitation is not visualized. No aortic stenosis is present. Pulmonic Valve: The pulmonic valve was normal in structure. Pulmonic valve regurgitation is not visualized. No evidence of pulmonic stenosis. Aorta: The aortic root is normal in size and structure. Venous: The inferior vena cava is normal in size with greater than 50% respiratory variability, suggesting right atrial pressure of 3 mmHg. IAS/Shunts: No atrial level  shunt detected by color flow Doppler.  LEFT VENTRICLE PLAX 2D LVIDd:         3.50 cm     Diastology LVIDs:         2.00 cm     LV e' medial:    7.29 cm/s LV PW:         1.30 cm     LV E/e' medial:  6.7 LV IVS:        1.00 cm     LV e' lateral:   8.92 cm/s LVOT diam:     2.30 cm     LV E/e' lateral: 5.5 LV SV:         64 LV SV Index:   41 LVOT Area:     4.15 cm  LV Volumes (MOD) LV vol d, MOD A2C: 71.1 ml LV vol d, MOD A4C: 64.2 ml LV vol s, MOD A2C: 21.8 ml LV vol s, MOD A4C: 19.3 ml LV SV MOD A2C:     49.3 ml LV SV MOD A4C:     64.2 ml LV SV MOD BP:      46.0 ml RIGHT VENTRICLE             IVC RV S prime:     12.70 cm/s  IVC diam: 1.90 cm TAPSE (M-mode): 2.2 cm LEFT ATRIUM             Index        RIGHT ATRIUM           Index LA diam:        1.80 cm 1.14 cm/m   RA Area:     12.50 cm LA Vol (A2C):   32.5 ml 20.53 ml/m  RA Volume:   29.00 ml  18.32 ml/m LA Vol (A4C):   12.7 ml 8.02 ml/m LA Biplane Vol: 21.2 ml 13.39 ml/m  AORTIC VALVE LVOT Vmax:   104.00 cm/s LVOT Vmean:  67.000 cm/s LVOT VTI:    0.155 m  AORTA Ao Root diam: 3.85 cm Ao Asc diam:  3.50 cm MITRAL VALVE               TRICUSPID VALVE MV Area (PHT): 5.25 cm    TR Peak grad:   25.6 mmHg MV Decel Time: 145  msec    TR Vmax:        253.00 cm/s MV E velocity: 48.75 cm/s MV A velocity: 96.35 cm/s  SHUNTS MV E/A ratio:  0.51        Systemic VTI:  0.16 m                            Systemic Diam: 2.30 cm Sanda Klein MD Electronically signed by Sanda Klein MD Signature Date/Time: 10/30/2021/9:39:03 AM    Final    DG Chest Port 1 View  Result Date: 10/29/2021 CLINICAL DATA:  Shortness of breath. EXAM: PORTABLE CHEST 1 VIEW COMPARISON:  Chest radiograph August 27, 2021. FINDINGS: No consolidation. No visible pleural effusions or pneumothorax. Cardiomediastinal silhouette is similar to prior and within normal limits. No acute osseous abnormality. IMPRESSION: No active disease. Electronically Signed   By: Margaretha Sheffield M.D.   On: 10/29/2021 08:06     Microbiology: Results for orders placed or performed during the hospital encounter of 10/29/21  Resp Panel by RT-PCR (Flu A&B, Covid) Anterior Nasal Swab     Status: None   Collection Time: 10/29/21  9:23 AM   Specimen: Anterior Nasal Swab  Result Value Ref Range Status   SARS Coronavirus 2 by RT PCR NEGATIVE NEGATIVE Final    Comment: (NOTE) SARS-CoV-2 target nucleic acids are NOT DETECTED.  The SARS-CoV-2 RNA is generally detectable in upper respiratory specimens during the acute phase of infection. The lowest concentration of SARS-CoV-2 viral copies this assay can detect is 138 copies/mL. A negative result does not preclude SARS-Cov-2 infection and should not be used as the sole basis for treatment or other patient management decisions. A negative result may occur with  improper specimen collection/handling, submission of specimen other than nasopharyngeal swab, presence of viral mutation(s) within the areas targeted by this assay, and inadequate number of viral copies(<138 copies/mL). A negative result must be combined with clinical observations, patient history, and epidemiological information. The expected result is Negative.  Fact Sheet for Patients:  EntrepreneurPulse.com.au  Fact Sheet for Healthcare Providers:  IncredibleEmployment.be  This test is no t yet approved or cleared by the [place] FDA and  has been authorized for detection and/or diagnosis of SARS-CoV-2 by FDA under an Emergency Use Authorization (EUA). This EUA will remain  in effect (meaning this test can be used) for the duration of the COVID-19 declaration under Section 564(b)(1) of the Act, 21 U.S.C.section 360bbb-3(b)(1), unless the authorization is terminated  or revoked sooner.       Influenza A by PCR NEGATIVE NEGATIVE Final   Influenza B by PCR NEGATIVE NEGATIVE Final    Comment: (NOTE) The Xpert Xpress SARS-CoV-2/FLU/RSV plus assay is intended as an  aid in the diagnosis of influenza from Nasopharyngeal swab specimens and should not be used as a sole basis for treatment. Nasal washings and aspirates are unacceptable for Xpert Xpress SARS-CoV-2/FLU/RSV testing.  Fact Sheet for Patients: EntrepreneurPulse.com.au  Fact Sheet for Healthcare Providers: IncredibleEmployment.be  This test is not yet approved or cleared by the [place] FDA and has been authorized for detection and/or diagnosis of SARS-CoV-2 by FDA under an Emergency Use Authorization (EUA). This EUA will remain in effect (meaning this test can be used) for the duration of the COVID-19 declaration under Section 564(b)(1) of the Act, 21 U.S.C. section 360bbb-3(b)(1), unless the authorization is terminated or revoked.  Performed at [facility], [address] [place], [place]  [address]     Labs: CBC: Recent Labs  Lab 10/29/21 0751 10/30/21 0332 10/31/21 0338  WBC 9.1 5.5 11.5*  NEUTROABS 3.9  --  8.9*  HGB 13.3 12.2* 12.6*  HCT 40.9 35.8* 38.1*  MCV 91.7 89.5 89.9  PLT 243 202 270   Basic Metabolic Panel: Recent Labs  Lab 10/29/21 0751 10/29/21 1328 10/30/21 0332 10/31/21 0338  NA 142  --  141 142  K 3.3*  --  4.1 3.7  CL 105  --  107 107  CO2 27  --  27 27  GLUCOSE 163*  --  155* 108*  BUN 10  --  15 21  CREATININE 0.70  --  0.73 0.83  CALCIUM 8.9  --  9.4 9.5  MG  --  2.2  --   --    Liver Function Tests: Recent Labs  Lab 10/30/21 0332  AST 26  ALT 18  ALKPHOS 74  BILITOT 0.9  PROT 6.1*  ALBUMIN 3.5   CBG: No results for input(s): "GLUCAP" in the last 168 hours.  Discharge time spent: greater than 30 minutes.  Signed: Patrecia Pour, MD [facility] 10/31/2021

## 2021-10-31 NOTE — TOC Transition Note (Signed)
Transition of Care [facility]) - CM/SW Discharge Note   Patient Details  Name: [name] MRN: [id number] Date of Birth: [birth year]  [facility] CM/SW Contact:  Leeroy Cha, RN Phone Number: 10/31/2021, 10:01 AM   Clinical Narrative:    Orders checked for dc needs for toc none found.   Final next level of care: Home/Self Care Barriers to Discharge: Barriers Resolved   Patient Goals and CMS Choice Patient states their goals for this hospitalization and ongoing recovery are:: to go back home and breath better CMS Medicare.gov Compare Post Acute Care list provided to:: Patient    Discharge Placement                       Discharge Plan and Services   Discharge Planning Services: CM Consult                                 Social Determinants of Health (SDOH) Interventions     Readmission Risk Interventions     No data to display

## 2021-10-31 NOTE — Plan of Care (Signed)
  Problem: Education: Goal: Knowledge of disease or condition will improve Outcome: Progressing   Problem: Respiratory: Goal: Ability to maintain a clear airway will improve Outcome: Progressing Goal: Ability to maintain adequate ventilation will improve Outcome: Progressing   Problem: Clinical Measurements: Goal: Will remain free from infection Outcome: Progressing   Problem: Coping: Goal: Level of anxiety will decrease Outcome: Progressing

## 2021-10-31 NOTE — Progress Notes (Signed)
SATURATION QUALIFICATIONS: (This note is used to comply with regulatory documentation for home oxygen)  Patient Saturations on Room Air at Rest = 94%  Patient Saturations on Room Air while Ambulating = 92 - 95%  Patient Saturations on 0 Liters of oxygen while Ambulating = 92%  Please briefly explain why patient needs home oxygen: Patient does not meet the requirements for home oxygen. Pt sustained O2 levels above 92 % on room air while ambulating.

## 2021-12-04 ENCOUNTER — Other Ambulatory Visit: Payer: Self-pay | Admitting: Family Medicine

## 2022-03-19 ENCOUNTER — Other Ambulatory Visit: Payer: Self-pay | Admitting: Family Medicine

## 2022-03-19 DIAGNOSIS — F411 Generalized anxiety disorder: Secondary | ICD-10-CM

## 2022-03-25 ENCOUNTER — Other Ambulatory Visit: Payer: Self-pay | Admitting: Family Medicine

## 2022-03-25 DIAGNOSIS — J449 Chronic obstructive pulmonary disease, unspecified: Secondary | ICD-10-CM

## 2022-06-17 ENCOUNTER — Other Ambulatory Visit: Payer: Self-pay

## 2022-06-17 ENCOUNTER — Encounter (HOSPITAL_COMMUNITY): Payer: Self-pay | Admitting: Emergency Medicine

## 2022-06-17 ENCOUNTER — Emergency Department (HOSPITAL_COMMUNITY): Payer: 59

## 2022-06-17 ENCOUNTER — Emergency Department (HOSPITAL_COMMUNITY)
Admission: EM | Admit: 2022-06-17 | Discharge: 2022-06-17 | Disposition: A | Discharge status: Home / Self Care | Payer: 59 | Attending: Emergency Medicine | Admitting: Emergency Medicine

## 2022-06-17 DIAGNOSIS — Z79899 Other long term (current) drug therapy: Secondary | ICD-10-CM | POA: Insufficient documentation

## 2022-06-17 DIAGNOSIS — Z1152 Encounter for screening for COVID-19: Secondary | ICD-10-CM | POA: Insufficient documentation

## 2022-06-17 DIAGNOSIS — J441 Chronic obstructive pulmonary disease with (acute) exacerbation: Secondary | ICD-10-CM | POA: Insufficient documentation

## 2022-06-17 DIAGNOSIS — R0602 Shortness of breath: Secondary | ICD-10-CM | POA: Diagnosis present

## 2022-06-17 DIAGNOSIS — I10 Essential (primary) hypertension: Secondary | ICD-10-CM | POA: Diagnosis not present

## 2022-06-17 DIAGNOSIS — F17210 Nicotine dependence, cigarettes, uncomplicated: Secondary | ICD-10-CM | POA: Diagnosis not present

## 2022-06-17 LAB — COMPREHENSIVE METABOLIC PANEL
ALT: 18 U/L (ref 0–44)
AST: 27 U/L (ref 15–41)
Albumin: 4.1 g/dL (ref 3.5–5.0)
Alkaline Phosphatase: 97 U/L (ref 38–126)
Anion gap: 15 (ref 5–15)
BUN: 14 mg/dL (ref 8–23)
CO2: 24 mmol/L (ref 22–32)
Calcium: 9.4 mg/dL (ref 8.9–10.3)
Chloride: 101 mmol/L (ref 98–111)
Creatinine, Ser: 0.58 mg/dL — ABNORMAL LOW (ref 0.61–1.24)
GFR, Estimated: >60 mL/min (ref >60)
Glucose, Bld: 79 mg/dL (ref 70–99)
Potassium: 4.2 mmol/L (ref 3.5–5.1)
Sodium: 140 mmol/L (ref 135–145)
Total Bilirubin: 0.6 mg/dL (ref 0.3–1.2)
Total Protein: 7.7 g/dL (ref 6.5–8.1)

## 2022-06-17 LAB — CBC WITH DIFFERENTIAL/PLATELET
Abs Immature Granulocytes: 0.12 10*3/uL — ABNORMAL HIGH (ref 0.00–0.07)
Basophils Absolute: 0.1 10*3/uL (ref 0.0–0.1)
Basophils Relative: 0 %
Eosinophils Absolute: 0 10*3/uL (ref 0.0–0.5)
Eosinophils Relative: 0 %
HCT: 43.8 % (ref 39.0–52.0)
Hemoglobin: 13.6 g/dL (ref 13.0–17.0)
Immature Granulocytes: 1 %
Lymphocytes Relative: 11 %
Lymphs Abs: 2.5 10*3/uL (ref 0.7–4.0)
MCH: 29.2 pg (ref 26.0–34.0)
MCHC: 31.1 g/dL (ref 30.0–36.0)
MCV: 94 fL (ref 80.0–100.0)
Monocytes Absolute: 1.5 10*3/uL — ABNORMAL HIGH (ref 0.1–1.0)
Monocytes Relative: 7 %
Neutro Abs: 18.4 10*3/uL — ABNORMAL HIGH (ref 1.7–7.7)
Neutrophils Relative %: 81 %
Platelets: 342 10*3/uL (ref 150–400)
RBC: 4.66 MIL/uL (ref 4.22–5.81)
RDW: 14.2 % (ref 11.5–15.5)
WBC: 22.5 10*3/uL — ABNORMAL HIGH (ref 4.0–10.5)
nRBC: 0 % (ref 0.0–0.2)

## 2022-06-17 LAB — BLOOD GAS, VENOUS
Acid-Base Excess: 5.3 mmol/L — ABNORMAL HIGH (ref 0.0–2.0)
Bicarbonate: 31.9 mmol/L — ABNORMAL HIGH (ref 20.0–28.0)
O2 Saturation: 63.9 %
Patient temperature: 37
pCO2, Ven: 54 mmHg (ref 44–60)
pH, Ven: 7.38 (ref 7.25–7.43)
pO2, Ven: 38 mmHg (ref 32–45)

## 2022-06-17 LAB — RESP PANEL BY RT-PCR (RSV, FLU A&B, COVID)  RVPGX2
Influenza A by PCR: NEGATIVE
Influenza B by PCR: NEGATIVE
Resp Syncytial Virus by PCR: NEGATIVE
SARS Coronavirus 2 by RT PCR: NEGATIVE

## 2022-06-17 LAB — TROPONIN I (HIGH SENSITIVITY)
Troponin I (High Sensitivity): 19 ng/L — ABNORMAL HIGH (ref <18)
Troponin I (High Sensitivity): 21 ng/L — ABNORMAL HIGH (ref <18)

## 2022-06-17 LAB — MAGNESIUM: Magnesium: 2.7 mg/dL — ABNORMAL HIGH (ref 1.7–2.4)

## 2022-06-17 LAB — BRAIN NATRIURETIC PEPTIDE: B Natriuretic Peptide: 67.5 pg/mL (ref 0.0–100.0)

## 2022-06-17 LAB — LIPASE, BLOOD: Lipase: 24 U/L (ref 11–51)

## 2022-06-17 MED ORDER — GUAIFENESIN-DM 100-10 MG/5ML PO SYRP: 5.0000 mL | ORAL_SOLUTION | ORAL | 0 refills | Status: DC | PRN

## 2022-06-17 MED ORDER — ALBUTEROL SULFATE HFA 108 (90 BASE) MCG/ACT IN AERS: 1.0000 | INHALATION_SPRAY | RESPIRATORY_TRACT | Status: DC | PRN

## 2022-06-17 MED ORDER — DOXYCYCLINE HYCLATE 100 MG PO CAPS: 100.0000 mg | ORAL_CAPSULE | Freq: Two times a day (BID) | ORAL | 0 refills | Status: AC

## 2022-06-17 MED ORDER — ALBUTEROL SULFATE HFA 108 (90 BASE) MCG/ACT IN AERS: 1.0000 | INHALATION_SPRAY | RESPIRATORY_TRACT | 0 refills | Status: DC | PRN

## 2022-06-17 MED ORDER — GUAIFENESIN-DM 100-10 MG/5ML PO SYRP
5.0000 mL | ORAL_SOLUTION | ORAL | Status: DC | PRN
  Administered 2022-06-17: 5 mL via ORAL
  Filled 2022-06-17: qty 10

## 2022-06-17 MED ORDER — MAGNESIUM SULFATE 2 GM/50ML IV SOLN
2.0000 g | Freq: Once | INTRAVENOUS | Status: AC
  Administered 2022-06-17: 2 g via INTRAVENOUS
  Filled 2022-06-17: qty 50

## 2022-06-17 MED ORDER — IPRATROPIUM-ALBUTEROL 0.5-2.5 (3) MG/3ML IN SOLN
3.0000 mL | Freq: Once | RESPIRATORY_TRACT | Status: AC
  Administered 2022-06-17: 3 mL via RESPIRATORY_TRACT
  Filled 2022-06-17: qty 3

## 2022-06-17 MED ORDER — PREDNISONE 50 MG PO TABS: 50.0000 mg | ORAL_TABLET | Freq: Every day | ORAL | 0 refills | Status: AC

## 2022-06-17 NOTE — ED Provider Notes (Signed)
[facility] Provider Note  CSN: [id number] Arrival date & time: 06/17/22 0700  Chief Complaint(s) Shortness of Breath  HPI Gavin Kane is a 67 y.o. male with past medical history as below, significant for CODP, GAD, HTN who presents to the ED with complaint of dib.  Patient visiting from out of town, does not have any of his medications.  Reports "cold symptoms" over the past 5 days with increased sputum production and increased difficulty breathing breathing.  Phlegm is intermittent yellow/brown/white.  Worsening exertional dyspnea from his baseline.  Not taking his medications this morning.  Ongoing tobacco use.  On EMS arrival his pulse ox was low 80s conversational dyspnea and diffuse wheezing per EMS, given epi, DuoNeb, steroids with improvement to her respiratory status.  Patient feels his breathing has improved but he is feeling shaky from the medications.   Past Medical History Past Medical History:  Diagnosis Date   Anxiety    COPD (chronic obstructive pulmonary disease) ([place])    HTN (hypertension)    Patient Active Problem List   Diagnosis Date Noted   COPD with acute exacerbation ([place]) 10/30/2021   Demand ischemia    Hypertensive emergency    COPD exacerbation ([place]) 10/29/2021   Weight loss 10/15/2021   Hypokalemia 08/27/2021   Generalized anxiety disorder 08/27/2021   Nicotine dependence, cigarettes, uncomplicated [id number]   Tobacco use 02/23/2021   Hyperglycemia    Anxiety 05/18/2014   COPD without exacerbation ([place]) 10/13/2012   Essential hypertension 10/11/2012   Home Medication(s) Prior to Admission medications   Medication Sig Start Date End Date Taking? Authorizing Provider  albuterol (VENTOLIN HFA) 108 (90 Base) MCG/ACT inhaler Inhale 1-2 puffs into the lungs every 4 (four) hours as needed for wheezing or shortness of breath. 06/17/22  Yes Wynona Dove A, DO  doxycycline (VIBRAMYCIN) 100 MG capsule Take 1 capsule  (100 mg total) by mouth 2 (two) times daily for 7 days. 06/17/22 06/24/22 Yes Wynona Dove A, DO  guaiFENesin-dextromethorphan (ROBITUSSIN DM) 100-10 MG/5ML syrup Take 5 mLs by mouth every 4 (four) hours as needed for cough. 06/17/22  Yes Wynona Dove A, DO  predniSONE (DELTASONE) 50 MG tablet Take 1 tablet (50 mg total) by mouth daily for 5 days. 06/17/22 06/22/22 Yes Wynona Dove A, DO  albuterol (VENTOLIN HFA) 108 (90 Base) MCG/ACT inhaler INHALE 2 PUFFS INTO THE LUNGS EVERY 4 HOURS AS NEEDED FOR WHEEZING OR SHORTNESS OF BREATH 03/25/22   Colletta Maryland, MD  amLODipine (NORVASC) 5 MG tablet TAKE 1 TABLET(5 MG) BY MOUTH DAILY 12/04/21   Zenia Resides, MD  escitalopram (LEXAPRO) 10 MG tablet TAKE 1 TABLET(10 MG) BY MOUTH DAILY 03/20/22   Colletta Maryland, MD  guaiFENesin (MUCINEX) 600 MG 12 hr tablet Take 2 tablets (1,200 mg total) by mouth 2 (two) times daily. Patient not taking: Reported on 10/29/2021 08/28/21   Niel Hummer A, MD  ibuprofen (ADVIL) 200 MG tablet Take 200 mg by mouth 2 (two) times daily as needed for headache.    [provider]  mometasone-formoterol (DULERA) 200-5 MCG/ACT AERO Inhale 2 puffs into the lungs in the morning and at bedtime. 08/28/21   Regalado, Belkys A, MD  spironolactone (ALDACTONE) 25 MG tablet Take 1 tablet (25 mg total) by mouth daily. 11/01/21   Patrecia Pour, MD  tiotropium (SPIRIVA HANDIHALER) 18 MCG inhalation capsule Use on capsule with 2 oral inhalations once per day Patient taking differently: Place 18 mcg into inhaler and inhale  See admin instructions. place one capsule  into inhaler and inhale 2 puffs into lungs once per day 08/28/21   Regalado, Belkys A, MD  albuterol (VENTOLIN HFA) 108 (90 Base) MCG/ACT inhaler INHALE 2 PUFFS INTO THE LUNGS DAILY FOR SHORTNESS OF BREATH OR COUGH Patient taking differently: 2 puffs every 4 (four) hours as needed for shortness of breath or wheezing. 03/07/21   Autry-Lott, Naaman Plummer, DO  ipratropium (ATROVENT) 0.06 %  nasal spray Place 2 sprays into both nostrils 4 (four) times daily. Patient not taking: Reported on 06/03/2019 11/23/18 06/22/19  Arturo Morton                                                                                                                                    Past Surgical History Past Surgical History:  Procedure Laterality Date   HEMORRHOID SURGERY     Family History Family History  Problem Relation Age of Onset   Cancer Other        Aunt   Colon cancer Neg Hx    Colon polyps Neg Hx    Rectal cancer Neg Hx    Stomach cancer Neg Hx     Social History Social History   Tobacco Use   Smoking status: Every Day    Packs/day: 0.50    Years: 48.00    Total pack years: 24.00    Types: Cigarettes    Start date: 05/06/1970   Smokeless tobacco: Former    Quit date: 10/05/2014   Tobacco comments:    7-8 per day   Vaping Use   Vaping Use: Never used  Substance Use Topics   Alcohol use: Yes    Comment: about 1/2 pint/day   Drug use: No   Allergies Patient has no known allergies.  Review of Systems Review of Systems  Constitutional:  Negative for chills and fever.  HENT:  Positive for congestion, postnasal drip and rhinorrhea. Negative for facial swelling and trouble swallowing.   Eyes:  Negative for photophobia and visual disturbance.  Respiratory:  Positive for cough, chest tightness and shortness of breath.   Cardiovascular:  Negative for chest pain and palpitations.  Gastrointestinal:  Negative for abdominal pain, nausea and vomiting.  Endocrine: Negative for polydipsia and polyuria.  Genitourinary:  Negative for difficulty urinating and hematuria.  Musculoskeletal:  Negative for gait problem and joint swelling.  Skin:  Negative for pallor and rash.  Neurological:  Negative for syncope and headaches.  Psychiatric/Behavioral:  Negative for agitation and confusion. The patient is nervous/anxious.     Physical Exam Vital Signs  I have reviewed the triage  vital signs BP 133/73   Pulse 92   Temp 98.2 F (36.8 C) (Oral)   Resp 17   Wt 53.5 kg   SpO2 93%   BMI 19.64 kg/m  Physical Exam Vitals and nursing note reviewed. Exam conducted with a chaperone present.  Constitutional:  General: He is not in acute distress.    Appearance: He is well-developed. He is not ill-appearing.  HENT:     Head: Normocephalic and atraumatic.     Right Ear: External ear normal.     Left Ear: External ear normal.     Mouth/Throat:     Mouth: Mucous membranes are moist.  Eyes:     General: No scleral icterus. Cardiovascular:     Rate and Rhythm: Normal rate and regular rhythm.     Pulses: Normal pulses.     Heart sounds: Normal heart sounds.  Pulmonary:     Effort: Pulmonary effort is normal. No respiratory distress.     Breath sounds: Wheezing present.     Comments: Diffuse wheezing Abdominal:     General: Abdomen is flat.     Palpations: Abdomen is soft.     Tenderness: There is no abdominal tenderness.  Musculoskeletal:     Cervical back: No rigidity.     Right lower leg: No edema.     Left lower leg: No edema.  Skin:    General: Skin is warm and dry.     Capillary Refill: Capillary refill takes less than 2 seconds.  Neurological:     Mental Status: He is alert and oriented to person, place, and time.     GCS: GCS eye subscore is 4. GCS verbal subscore is 5. GCS motor subscore is 6.  Psychiatric:        Mood and Affect: Mood normal.        Behavior: Behavior normal.     ED Results and Treatments Labs (all labs ordered are listed, but only abnormal results are displayed) Labs Reviewed  CBC WITH DIFFERENTIAL/PLATELET - Abnormal; Notable for the following components:      Result Value   WBC 22.5 (*)    Neutro Abs 18.4 (*)    Monocytes Absolute 1.5 (*)    Abs Immature Granulocytes 0.12 (*)    All other components within normal limits  BLOOD GAS, VENOUS - Abnormal; Notable for the following components:   Bicarbonate 31.9 (*)     Acid-Base Excess 5.3 (*)    All other components within normal limits  MAGNESIUM - Abnormal; Notable for the following components:   Magnesium 2.7 (*)    All other components within normal limits  COMPREHENSIVE METABOLIC PANEL - Abnormal; Notable for the following components:   Creatinine, Ser 0.58 (*)    All other components within normal limits  TROPONIN I (HIGH SENSITIVITY) - Abnormal; Notable for the following components:   Troponin I (High Sensitivity) 19 (*)    All other components within normal limits  TROPONIN I (HIGH SENSITIVITY) - Abnormal; Notable for the following components:   Troponin I (High Sensitivity) 21 (*)    All other components within normal limits  RESP PANEL BY RT-PCR (RSV, FLU A&B, COVID)  RVPGX2  BRAIN NATRIURETIC PEPTIDE  LIPASE, BLOOD  Radiology DG Chest Port 1 View  Result Date: 06/17/2022 CLINICAL DATA:  Shortness of breath. Productive cough and congestion. EXAM: PORTABLE CHEST 1 VIEW COMPARISON:  05/31/2021 FINDINGS: The heart size and mediastinal contours are within normal limits. Both lungs are clear. The visualized skeletal structures are unremarkable. IMPRESSION: No active disease. Electronically Signed   By: Kerby Moors M.D.   On: 06/17/2022 07:58    Pertinent labs & imaging results that were available during my care of the patient were reviewed by me and considered in my medical decision making (see MDM for details).  Medications Ordered in ED Medications  guaiFENesin-dextromethorphan (ROBITUSSIN DM) 100-10 MG/5ML syrup 5 mL (5 mLs Oral Given 06/17/22 0755)  albuterol (VENTOLIN HFA) 108 (90 Base) MCG/ACT inhaler 1-2 puff (has no administration in time range)  ipratropium-albuterol (DUONEB) 0.5-2.5 (3) MG/3ML nebulizer solution 3 mL (3 mLs Nebulization Given 06/17/22 0716)  magnesium sulfate IVPB 2 g 50 mL (0 g Intravenous Stopped  06/17/22 0905)                                                                                                                                     Procedures Procedures  (including critical care time)  Medical Decision Making / ED Course    Medical Decision Making:    Kendell Berding is a 67 y.o. male  with past medical history as below, significant for CODP, GAD, HTN who presents to the ED with complaint of dib. . The complaint involves an extensive differential diagnosis and also carries with it a high risk of complications and morbidity.  Serious etiology was considered. Ddx includes but is not limited to: In my evaluation of this patient's dyspnea my DDx includes, but is not limited to, pneumonia, pulmonary embolism, pneumothorax, pulmonary edema, metabolic acidosis, asthma, COPD, cardiac cause, anemia, anxiety, etc.    Complete initial physical exam performed, notably the patient  was wheezing throughout, no conversational dyspnea on arrival.    Reviewed and confirmed nursing documentation for past medical history, family history, social history.  Vital signs reviewed.    Clinical Course as of 06/17/22 1058  Mon Jun 17, 2022  1003 Ambulated w/o difficulty, did not desaturate or have sig dib [SG]  1050 On recheck he is feeling much better, he feels that he is "100% back to normal." [SG]  1053 WBC(!): 22.5 Recently taking steroids [SG]  1054 Troponin I (High Sensitivity)(!): 21 Troponin flat, favor demand ischemia in setting of copd exacerbation/resp abnormality  [SG]    Clinical Course User Index [SG] Jeanell Sparrow, DO   Patient poorly controlled COPD continues to smoke tobacco.  Did not bring his medications with him from out of town, lives in [place] per patient.  Patient respiratory distress on EMS arrival.  Now somewhat improved.  Will check screening labs, get x-ray, check RVP, give repeat DuoNeb given diffuse wheezing.  Also give mag sulfate.  Patient feeling  better on  recheck.  Breathing returned to baseline.  He is breathing comfortably on ambient air.  Ambulated without significant dyspnea or hypoxia.  Favor COPD exacerbation, will start on bronchodilators, steroids, also give antibiotic for mucolysis..  Advised he refrain from tobacco use in the future.  Follow-up with primary and pulmonology.  Reports he has primary care back home and will try to get back there today in the next day. Baldo Ash)  He has leukocytosis, was recently on steroids with similar complaint. He is not febrile, cxr is wnl. Unclear source. Will start on abx to help with mucolysis.   The patient improved significantly and was discharged in stable condition. Detailed discussions were had with the patient regarding current findings, and need for close f/u with PCP or on call doctor. The patient has been instructed to return immediately if the symptoms worsen in any way for re-evaluation. Patient verbalized understanding and is in agreement with current care plan. All questions answered prior to discharge.     Additional history obtained: -Additional history obtained from ems -External records from outside source obtained and reviewed including: Chart review including previous notes, labs, imaging, consultation notes including recent admissions, home medications, prior labs and imaging   Lab Tests: -I ordered, reviewed, and interpreted labs.   The pertinent results include:   Labs Reviewed  CBC WITH DIFFERENTIAL/PLATELET - Abnormal; Notable for the following components:      Result Value   WBC 22.5 (*)    Neutro Abs 18.4 (*)    Monocytes Absolute 1.5 (*)    Abs Immature Granulocytes 0.12 (*)    All other components within normal limits  BLOOD GAS, VENOUS - Abnormal; Notable for the following components:   Bicarbonate 31.9 (*)    Acid-Base Excess 5.3 (*)    All other components within normal limits  MAGNESIUM - Abnormal; Notable for the following components:   Magnesium 2.7 (*)     All other components within normal limits  COMPREHENSIVE METABOLIC PANEL - Abnormal; Notable for the following components:   Creatinine, Ser 0.58 (*)    All other components within normal limits  TROPONIN I (HIGH SENSITIVITY) - Abnormal; Notable for the following components:   Troponin I (High Sensitivity) 19 (*)    All other components within normal limits  TROPONIN I (HIGH SENSITIVITY) - Abnormal; Notable for the following components:   Troponin I (High Sensitivity) 21 (*)    All other components within normal limits  RESP PANEL BY RT-PCR (RSV, FLU A&B, COVID)  RVPGX2  BRAIN NATRIURETIC PEPTIDE  LIPASE, BLOOD    Notable for as above  EKG   EKG Interpretation  Date/Time:  Monday June 17 2022 07:19:13 EST Ventricular Rate:  109 PR Interval:  155 QRS Duration: 78 QT Interval:  319 QTC Calculation: 430 R Axis:   72 Text Interpretation: Sinus tachycardia Atrial premature complex Biatrial enlargement RSR' in V1 or V2, probably normal variant Confirmed by Wynona Dove ([id number]) on 06/17/2022 7:48:38 AM         Imaging Studies ordered: I ordered imaging studies including cxr I independently visualized the following imaging with scope of interpretation limited to determining acute life threatening conditions related to emergency care: cxr, which revealed no acute process I independently visualized and interpreted imaging. I agree with the radiologist interpretation   Medicines ordered and prescription drug management: Meds ordered this encounter  Medications   ipratropium-albuterol (DUONEB) 0.5-2.5 (3) MG/3ML nebulizer solution 3 mL   magnesium sulfate IVPB 2 g  50 mL   guaiFENesin-dextromethorphan (ROBITUSSIN DM) 100-10 MG/5ML syrup 5 mL   albuterol (VENTOLIN HFA) 108 (90 Base) MCG/ACT inhaler 1-2 puff   predniSONE (DELTASONE) 50 MG tablet    Sig: Take 1 tablet (50 mg total) by mouth daily for 5 days.    Dispense:  5 tablet    Refill:  0   doxycycline (VIBRAMYCIN) 100 MG  capsule    Sig: Take 1 capsule (100 mg total) by mouth 2 (two) times daily for 7 days.    Dispense:  14 capsule    Refill:  0   guaiFENesin-dextromethorphan (ROBITUSSIN DM) 100-10 MG/5ML syrup    Sig: Take 5 mLs by mouth every 4 (four) hours as needed for cough.    Dispense:  118 mL    Refill:  0   albuterol (VENTOLIN HFA) 108 (90 Base) MCG/ACT inhaler    Sig: Inhale 1-2 puffs into the lungs every 4 (four) hours as needed for wheezing or shortness of breath.    Dispense:  1 each    Refill:  0    -I have reviewed the patients home medicines and have made adjustments as needed   Consultations Obtained: na   Cardiac Monitoring: The patient was maintained on a cardiac monitor.  I personally viewed and interpreted the cardiac monitored which showed an underlying rhythm of: nsr  Social Determinants of Health:  Diagnosis or treatment significantly limited by social determinants of health: current smoker Counseled patient for approximately 3 minutes regarding smoking cessation. Discussed risks of smoking and how they applied and affected their visit here today. Patient not ready to quit at this time, however will follow up with their primary doctor when they are.   CPT code: [phone]: intermediate counseling for smoking cessation     Reevaluation: After the interventions noted above, I reevaluated the patient and found that they have resolved  Co morbidities that complicate the patient evaluation  Past Medical History:  Diagnosis Date   Anxiety    COPD (chronic obstructive pulmonary disease) ([facility])    HTN (hypertension)       Dispostion: Disposition decision including need for hospitalization was considered, and patient discharged from emergency department.    Final Clinical Impression(s) / ED Diagnoses Final diagnoses:  COPD exacerbation ([place])     This chart was dictated using voice recognition software.  Despite best efforts to proofread,  errors can occur which can  change the documentation meaning.    Wynona Dove A, DO 06/17/22 1059

## 2022-06-17 NOTE — ED Notes (Addendum)
Resting comfortably, NAD, calm, interactive, speaking clearly in shortened phrases, grunting (at baseline per pt), VSS. "Feel better, breathing easier". Mag infusing. Remains 97% on RA.

## 2022-06-17 NOTE — Discharge Instructions (Addendum)
It was a pleasure caring for you today in the emergency department.  Please return to the emergency department for any worsening or worrisome symptoms.  Please stop smoking

## 2022-06-17 NOTE — ED Triage Notes (Signed)
BIB [facility] from home for sob with associated productive cough (clear phlegm), congestion, diminished LS, crackles and wheezing. 2 duonebs, epi, solumedrol and 2 gms magnesium given. NSL 18g L AC. H/o COPD. Cold sx x 2 weeks. HR ST 130, BP 202/120, 86-88% RA. 94% RA on arrival after treatment. Endorses anxiety and panic contributed. EDP and RT at [facility].

## 2022-07-22 ENCOUNTER — Other Ambulatory Visit: Payer: Self-pay | Admitting: Family Medicine

## 2022-07-22 DIAGNOSIS — F411 Generalized anxiety disorder: Secondary | ICD-10-CM

## 2022-08-22 ENCOUNTER — Institutional Professional Consult (permissible substitution) (HOSPITAL_BASED_OUTPATIENT_CLINIC_OR_DEPARTMENT_OTHER): Payer: 59 | Admitting: Pulmonary Disease

## 2022-10-04 ENCOUNTER — Other Ambulatory Visit: Payer: Self-pay | Admitting: *Deleted

## 2022-10-04 DIAGNOSIS — F411 Generalized anxiety disorder: Secondary | ICD-10-CM

## 2022-11-11 ENCOUNTER — Telehealth: Payer: Self-pay

## 2022-11-11 NOTE — Transitions of Care (Post Inpatient/ED Visit) (Unsigned)
   11/11/2022  Name: Gavin Kane MRN: [id number] DOB: 05-20-1955  Today's TOC FU Call Status: Today's TOC FU Call Status:: Unsuccessul Call (1st Attempt) Unsuccessful Call (1st Attempt) Date: 11/11/22  Attempted to reach the patient regarding the most recent Inpatient/ED visit.  Follow Up Plan: Additional outreach attempts will be made to reach the patient to complete the Transitions of Care (Post Inpatient/ED visit) call.   Signature Karena Addison, LPN [facility] Nurse Health Advisor Direct Dial [phone]

## 2022-11-12 NOTE — Transitions of Care (Post Inpatient/ED Visit) (Unsigned)
   11/12/2022  Name: Tyke Outman MRN: [id number] DOB: Jun 18, 1955  Today's TOC FU Call Status: Today's TOC FU Call Status:: Unsuccessful Call (2nd Attempt) Unsuccessful Call (1st Attempt) Date: 11/11/22 Unsuccessful Call (2nd Attempt) Date: 11/12/22  Attempted to reach the patient regarding the most recent Inpatient/ED visit.  Follow Up Plan: Additional outreach attempts will be made to reach the patient to complete the Transitions of Care (Post Inpatient/ED visit) call.   Signature Karena Addison, LPN [facility] Nurse Health Advisor Direct Dial [phone]

## 2022-11-13 NOTE — Transitions of Care (Post Inpatient/ED Visit) (Signed)
   11/13/2022  Name: Gavin Kane MRN: [id number] DOB: 03-Feb-1956  Today's TOC FU Call Status: Today's TOC FU Call Status:: Unsuccessful Call (3rd Attempt) Unsuccessful Call (1st Attempt) Date: 11/11/22 Unsuccessful Call (2nd Attempt) Date: 11/12/22 Unsuccessful Call (3rd Attempt) Date: 11/13/22  Attempted to reach the patient regarding the most recent Inpatient/ED visit.  Follow Up Plan: No further outreach attempts will be made at this time. We have been unable to contact the patient.  Signature Karena Addison, LPN [facility] Nurse Health Advisor Direct Dial [phone]

## 2023-02-23 ENCOUNTER — Emergency Department (HOSPITAL_COMMUNITY)
Admission: EM | Admit: 2023-02-23 | Discharge: 2023-02-23 | Disposition: A | Discharge status: Home / Self Care | Payer: 59 | Attending: Emergency Medicine | Admitting: Emergency Medicine

## 2023-02-23 ENCOUNTER — Emergency Department (HOSPITAL_COMMUNITY): Payer: 59

## 2023-02-23 ENCOUNTER — Other Ambulatory Visit: Payer: Self-pay

## 2023-02-23 DIAGNOSIS — J441 Chronic obstructive pulmonary disease with (acute) exacerbation: Secondary | ICD-10-CM | POA: Diagnosis not present

## 2023-02-23 DIAGNOSIS — Z7952 Long term (current) use of systemic steroids: Secondary | ICD-10-CM | POA: Insufficient documentation

## 2023-02-23 DIAGNOSIS — R0602 Shortness of breath: Secondary | ICD-10-CM | POA: Diagnosis present

## 2023-02-23 LAB — COMPREHENSIVE METABOLIC PANEL
ALT: 15 U/L (ref 0–44)
AST: 21 U/L (ref 15–41)
Albumin: 3.9 g/dL (ref 3.5–5.0)
Alkaline Phosphatase: 96 U/L (ref 38–126)
Anion gap: 11 (ref 5–15)
BUN: 15 mg/dL (ref 8–23)
CO2: 25 mmol/L (ref 22–32)
Calcium: 8.7 mg/dL — ABNORMAL LOW (ref 8.9–10.3)
Chloride: 102 mmol/L (ref 98–111)
Creatinine, Ser: 0.73 mg/dL (ref 0.61–1.24)
GFR, Estimated: >60 mL/min (ref >60)
Glucose, Bld: 231 mg/dL — ABNORMAL HIGH (ref 70–99)
Potassium: 4.3 mmol/L (ref 3.5–5.1)
Sodium: 138 mmol/L (ref 135–145)
Total Bilirubin: 0.6 mg/dL (ref 0.3–1.2)
Total Protein: 6.6 g/dL (ref 6.5–8.1)

## 2023-02-23 LAB — CBC WITH DIFFERENTIAL/PLATELET
Abs Immature Granulocytes: 0.02 10*3/uL (ref 0.00–0.07)
Basophils Absolute: 0 10*3/uL (ref 0.0–0.1)
Basophils Relative: 0 %
Eosinophils Absolute: 0 10*3/uL (ref 0.0–0.5)
Eosinophils Relative: 0 %
HCT: 39.8 % (ref 39.0–52.0)
Hemoglobin: 12.9 g/dL — ABNORMAL LOW (ref 13.0–17.0)
Immature Granulocytes: 0 %
Lymphocytes Relative: 9 %
Lymphs Abs: 0.8 10*3/uL (ref 0.7–4.0)
MCH: 28.9 pg (ref 26.0–34.0)
MCHC: 32.4 g/dL (ref 30.0–36.0)
MCV: 89.2 fL (ref 80.0–100.0)
Monocytes Absolute: 0.1 10*3/uL (ref 0.1–1.0)
Monocytes Relative: 1 %
Neutro Abs: 7.2 10*3/uL (ref 1.7–7.7)
Neutrophils Relative %: 90 %
Platelets: 255 10*3/uL (ref 150–400)
RBC: 4.46 MIL/uL (ref 4.22–5.81)
RDW: 14.8 % (ref 11.5–15.5)
WBC: 8.1 10*3/uL (ref 4.0–10.5)
nRBC: 0 % (ref 0.0–0.2)

## 2023-02-23 MED ORDER — PREDNISONE 10 MG PO TABS: 20.0000 mg | ORAL_TABLET | Freq: Every day | ORAL | 0 refills | Status: DC

## 2023-02-23 MED ORDER — ALBUTEROL SULFATE HFA 108 (90 BASE) MCG/ACT IN AERS
2.0000 | INHALATION_SPRAY | Freq: Once | RESPIRATORY_TRACT | Status: AC
  Administered 2023-02-23: 2 via RESPIRATORY_TRACT
  Filled 2023-02-23: qty 6.7

## 2023-02-23 MED ORDER — ALBUTEROL SULFATE (2.5 MG/3ML) 0.083% IN NEBU
2.5000 mg | INHALATION_SOLUTION | Freq: Once | RESPIRATORY_TRACT | Status: AC
  Administered 2023-02-23: 2.5 mg via RESPIRATORY_TRACT
  Filled 2023-02-23: qty 3

## 2023-02-23 MED ORDER — MAGNESIUM SULFATE 2 GM/50ML IV SOLN
2.0000 g | Freq: Once | INTRAVENOUS | Status: AC
  Administered 2023-02-23: 2 g via INTRAVENOUS
  Filled 2023-02-23: qty 50

## 2023-02-23 MED ORDER — METHYLPREDNISOLONE SODIUM SUCC 125 MG IJ SOLR
125.0000 mg | Freq: Once | INTRAMUSCULAR | Status: AC
  Administered 2023-02-23: 125 mg via INTRAVENOUS
  Filled 2023-02-23: qty 2

## 2023-02-23 MED ORDER — IPRATROPIUM-ALBUTEROL 0.5-2.5 (3) MG/3ML IN SOLN
3.0000 mL | Freq: Once | RESPIRATORY_TRACT | Status: AC
  Administered 2023-02-23: 3 mL via RESPIRATORY_TRACT
  Filled 2023-02-23: qty 3

## 2023-02-23 NOTE — ED Provider Notes (Signed)
[place] EMERGENCY DEPARTMENT AT [facility] Provider Note   CSN: [id number] Arrival date & time: 02/23/23  1736     History {Add pertinent medical, surgical, social history, OB history to HPI:1} Chief Complaint  Patient presents with   Shortness of Breath    Gavin Kane is a 67 y.o. male.  Patient has a history of COPD.  Patient complains of shortness of breath   Shortness of Breath      Home Medications Prior to Admission medications   Medication Sig Start Date End Date Taking? Authorizing Provider  albuterol (VENTOLIN HFA) 108 (90 Base) MCG/ACT inhaler INHALE 2 PUFFS INTO THE LUNGS EVERY 4 HOURS AS NEEDED FOR WHEEZING OR SHORTNESS OF BREATH 03/25/22  Yes Elberta Fortis, MD  amLODipine (NORVASC) 5 MG tablet TAKE 1 TABLET(5 MG) BY MOUTH DAILY 12/04/21  Yes Hensel, Santiago Bumpers, MD  citalopram (CELEXA) 10 MG tablet Take 10 mg by mouth daily.   Yes [provider]  ibuprofen (ADVIL) 200 MG tablet Take 200 mg by mouth 2 (two) times daily as needed for headache.   Yes [provider]  predniSONE (DELTASONE) 10 MG tablet Take 2 tablets (20 mg total) by mouth daily. 02/23/23  Yes Bethann Berkshire, MD  tiotropium (SPIRIVA HANDIHALER) 18 MCG inhalation capsule Use on capsule with 2 oral inhalations once per day Patient taking differently: Place 18 mcg into inhaler and inhale See admin instructions. place one capsule  into inhaler and inhale 2 puffs into lungs once per day 08/28/21  Yes Regalado, Belkys A, MD  TRELEGY ELLIPTA 100-62.5-25 MCG/ACT AEPB Inhale 1 puff into the lungs daily.   Yes [provider]  albuterol (VENTOLIN HFA) 108 (90 Base) MCG/ACT inhaler Inhale 1-2 puffs into the lungs every 4 (four) hours as needed for wheezing or shortness of breath. Patient not taking: Reported on 02/23/2023 06/17/22   Tanda Rockers A, DO  escitalopram (LEXAPRO) 10 MG tablet TAKE 1 TABLET(10 MG) BY MOUTH DAILY Patient not taking: Reported on 02/23/2023  07/23/22   Elberta Fortis, MD  guaiFENesin (MUCINEX) 600 MG 12 hr tablet Take 2 tablets (1,200 mg total) by mouth 2 (two) times daily. Patient not taking: Reported on 10/29/2021 08/28/21   Regalado, Jon Billings A, MD  guaiFENesin-dextromethorphan (ROBITUSSIN DM) 100-10 MG/5ML syrup Take 5 mLs by mouth every 4 (four) hours as needed for cough. Patient not taking: Reported on 02/23/2023 06/17/22   Sloan Leiter, DO  mometasone-formoterol [facility]) 200-5 MCG/ACT AERO Inhale 2 puffs into the lungs in the morning and at bedtime. Patient not taking: Reported on 02/23/2023 08/28/21   Regalado, Jon Billings A, MD  spironolactone (ALDACTONE) 25 MG tablet Take 1 tablet (25 mg total) by mouth daily. Patient not taking: Reported on 02/23/2023 11/01/21   Tyrone Nine, MD  albuterol (VENTOLIN HFA) 108 (90 Base) MCG/ACT inhaler INHALE 2 PUFFS INTO THE LUNGS DAILY FOR SHORTNESS OF BREATH OR COUGH Patient taking differently: 2 puffs every 4 (four) hours as needed for shortness of breath or wheezing. 03/07/21   Autry-Lott, Randa Evens, DO  ipratropium (ATROVENT) 0.06 % nasal spray Place 2 sprays into both nostrils 4 (four) times daily. Patient not taking: Reported on 06/03/2019 11/23/18 06/22/19  Belinda Fisher, PA-C      Allergies    Patient has no known allergies.    Review of Systems   Review of Systems  Respiratory:  Positive for shortness of breath.     Physical Exam Updated Vital Signs BP (!) 145/81 (BP Location: Right  Arm)   Pulse (!) 114   Temp 98.2 F (36.8 C) (Oral)   Resp (!) 22   Ht 5\' 5"  (1.651 m)   Wt 56.7 kg   SpO2 94%   BMI 20.80 kg/m  Physical Exam  ED Results / Procedures / Treatments   Labs (all labs ordered are listed, but only abnormal results are displayed) Labs Reviewed  CBC WITH DIFFERENTIAL/PLATELET - Abnormal; Notable for the following components:      Result Value   Hemoglobin 12.9 (*)    All other components within normal limits  COMPREHENSIVE METABOLIC PANEL - Abnormal; Notable for the  following components:   Glucose, Bld 231 (*)    Calcium 8.7 (*)    All other components within normal limits    EKG None  Radiology DG Chest Port 1 View  Result Date: 02/23/2023 CLINICAL DATA:  Shortness of breath EXAM: PORTABLE CHEST 1 VIEW COMPARISON:  06/17/2022 FINDINGS: The heart size and mediastinal contours are within normal limits. Both lungs are clear. The visualized skeletal structures are unremarkable. IMPRESSION: No active disease. Electronically Signed   By: Alcide Clever M.D.   On: 02/23/2023 20:14    Procedures Procedures  {Document cardiac monitor, telemetry assessment procedure when appropriate:1}  Medications Ordered in ED Medications  albuterol (VENTOLIN HFA) 108 (90 Base) MCG/ACT inhaler 2 puff (has no administration in time range)  methylPREDNISolone sodium succinate (SOLU-MEDROL) 125 mg/2 mL injection 125 mg (125 mg Intravenous Given 02/23/23 1811)  ipratropium-albuterol (DUONEB) 0.5-2.5 (3) MG/3ML nebulizer solution 3 mL (3 mLs Nebulization Given 02/23/23 1808)  albuterol (PROVENTIL) (2.5 MG/3ML) 0.083% nebulizer solution 2.5 mg (2.5 mg Nebulization Given 02/23/23 1808)  magnesium sulfate IVPB 2 g 50 mL (0 g Intravenous Stopped 02/23/23 1910)    ED Course/ Medical Decision Making/ A&P   {   Click here for ABCD2, HEART and other calculatorsREFRESH Note before signing :1}                              Medical Decision Making Amount and/or Complexity of Data Reviewed Labs: ordered. Radiology: ordered.  Risk Prescription drug management.   Exacerbation COPD.  Patient sent home on albuterol inhaler and prednisone  {Document critical care time when appropriate:1} {Document review of labs and clinical decision tools ie heart score, Chads2Vasc2 etc:1}  {Document your independent review of radiology images, and any outside records:1} {Document your discussion with family members, caretakers, and with consultants:1} {Document social determinants of health  affecting pt's care:1} {Document your decision making why or why not admission, treatments were needed:1} Final Clinical Impression(s) / ED Diagnoses Final diagnoses:  COPD exacerbation (HCC)    Rx / DC Orders ED Discharge Orders          Ordered    predniSONE (DELTASONE) 10 MG tablet  Daily        02/23/23 2134

## 2023-02-23 NOTE — Discharge Instructions (Signed)
Follow-up with your family doctor later this week for recheck
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
## 2023-03-07 ENCOUNTER — Telehealth: Payer: Self-pay

## 2023-03-07 NOTE — Transitions of Care (Post Inpatient/ED Visit) (Signed)
   03/07/2023  Name: Gavin Kane MRN: [id number] DOB: 29-Sep-1955  Today's TOC FU Call Status:    Attempted to reach the patient regarding the most recent Inpatient/ED visit.  Follow Up Plan: Additional outreach attempts will be made to reach the patient to complete the Transitions of Care (Post Inpatient/ED visit) call.   Jodelle Gross RN, BSN, CCM RN Care Manager  Transitions of Care  [facility] - [facility]  [phone]

## 2023-03-10 ENCOUNTER — Telehealth: Payer: Self-pay

## 2023-03-10 NOTE — Transitions of Care (Post Inpatient/ED Visit) (Signed)
   03/10/2023  Name: Antonyo Hinderer MRN: [id number] DOB: 1955-11-19  Today's TOC FU Call Status: Today's TOC FU Call Status:: Unsuccessful Call (2nd Attempt) Unsuccessful Call (2nd Attempt) Date: 03/10/23  Attempted to reach the patient regarding the most recent Inpatient/ED visit.  Follow Up Plan: Additional outreach attempts will be made to reach the patient to complete the Transitions of Care (Post Inpatient/ED visit) call.   Alyse Low, RN, BA, [facility], CRRN [facility] Coordinator, Transition of Care Ph # [phone]

## 2023-03-11 ENCOUNTER — Telehealth: Payer: Self-pay

## 2023-03-11 NOTE — Transitions of Care (Post Inpatient/ED Visit) (Signed)
   03/11/2023  Name: Macari Zalesky MRN: [id number] DOB: [birth year]  Today's TOC FU Call Status: Today's TOC FU Call Status:: Unsuccessful Call (3rd Attempt) Unsuccessful Call (3rd Attempt) Date: 03/11/23  Attempted to reach the patient regarding the most recent Inpatient/ED visit.  Follow Up Plan: No further outreach attempts will be made at this time. We have been unable to contact the patient.  Jodelle Gross RN, BSN, CCM RN Care Manager  Transitions of Care  [facility] - [facility]  [phone]

## 2023-06-22 ENCOUNTER — Emergency Department (HOSPITAL_COMMUNITY): Payer: 59

## 2023-06-22 ENCOUNTER — Encounter (HOSPITAL_COMMUNITY): Payer: Self-pay | Admitting: Emergency Medicine

## 2023-06-22 ENCOUNTER — Inpatient Hospital Stay (HOSPITAL_COMMUNITY)
Admission: EM | Admit: 2023-06-22 | Discharge: 2023-06-24 | DRG: 917 | Disposition: A | Discharge status: Home / Self Care | Payer: 59 | Attending: Internal Medicine | Admitting: Internal Medicine

## 2023-06-22 ENCOUNTER — Other Ambulatory Visit: Payer: Self-pay

## 2023-06-22 DIAGNOSIS — I5A Non-ischemic myocardial injury (non-traumatic): Secondary | ICD-10-CM | POA: Diagnosis present

## 2023-06-22 DIAGNOSIS — J441 Chronic obstructive pulmonary disease with (acute) exacerbation: Secondary | ICD-10-CM | POA: Diagnosis present

## 2023-06-22 DIAGNOSIS — J9602 Acute respiratory failure with hypercapnia: Secondary | ICD-10-CM

## 2023-06-22 DIAGNOSIS — E861 Hypovolemia: Secondary | ICD-10-CM | POA: Diagnosis present

## 2023-06-22 DIAGNOSIS — Z79899 Other long term (current) drug therapy: Secondary | ICD-10-CM

## 2023-06-22 DIAGNOSIS — T59811A Toxic effect of smoke, accidental (unintentional), initial encounter: Principal | ICD-10-CM

## 2023-06-22 DIAGNOSIS — Z7951 Long term (current) use of inhaled steroids: Secondary | ICD-10-CM

## 2023-06-22 DIAGNOSIS — E860 Dehydration: Secondary | ICD-10-CM | POA: Diagnosis present

## 2023-06-22 DIAGNOSIS — I2489 Other forms of acute ischemic heart disease: Secondary | ICD-10-CM | POA: Diagnosis present

## 2023-06-22 DIAGNOSIS — E876 Hypokalemia: Secondary | ICD-10-CM | POA: Diagnosis present

## 2023-06-22 DIAGNOSIS — Z1152 Encounter for screening for COVID-19: Secondary | ICD-10-CM | POA: Diagnosis not present

## 2023-06-22 DIAGNOSIS — Z716 Tobacco abuse counseling: Secondary | ICD-10-CM | POA: Diagnosis not present

## 2023-06-22 DIAGNOSIS — J9601 Acute respiratory failure with hypoxia: Secondary | ICD-10-CM | POA: Diagnosis present

## 2023-06-22 DIAGNOSIS — F17210 Nicotine dependence, cigarettes, uncomplicated: Secondary | ICD-10-CM | POA: Diagnosis present

## 2023-06-22 DIAGNOSIS — E870 Hyperosmolality and hypernatremia: Secondary | ICD-10-CM | POA: Diagnosis present

## 2023-06-22 DIAGNOSIS — I10 Essential (primary) hypertension: Secondary | ICD-10-CM | POA: Diagnosis present

## 2023-06-22 DIAGNOSIS — F419 Anxiety disorder, unspecified: Secondary | ICD-10-CM | POA: Diagnosis present

## 2023-06-22 DIAGNOSIS — R0602 Shortness of breath: Secondary | ICD-10-CM | POA: Diagnosis present

## 2023-06-22 DIAGNOSIS — R000 Tachycardia, unspecified: Secondary | ICD-10-CM | POA: Diagnosis present

## 2023-06-22 LAB — CBC WITH DIFFERENTIAL/PLATELET
Abs Immature Granulocytes: 0.03 10*3/uL (ref 0.00–0.07)
Basophils Absolute: 0.1 10*3/uL (ref 0.0–0.1)
Basophils Relative: 1 %
Eosinophils Absolute: 0.2 10*3/uL (ref 0.0–0.5)
Eosinophils Relative: 1 %
HCT: 44.3 % (ref 39.0–52.0)
Hemoglobin: 13.9 g/dL (ref 13.0–17.0)
Immature Granulocytes: 0 %
Lymphocytes Relative: 43 %
Lymphs Abs: 4.9 10*3/uL — ABNORMAL HIGH (ref 0.7–4.0)
MCH: 28.5 pg (ref 26.0–34.0)
MCHC: 31.4 g/dL (ref 30.0–36.0)
MCV: 91 fL (ref 80.0–100.0)
Monocytes Absolute: 1 10*3/uL (ref 0.1–1.0)
Monocytes Relative: 8 %
Neutro Abs: 5.4 10*3/uL (ref 1.7–7.7)
Neutrophils Relative %: 47 %
Platelets: 297 10*3/uL (ref 150–400)
RBC: 4.87 MIL/uL (ref 4.22–5.81)
RDW: 14.6 % (ref 11.5–15.5)
WBC: 11.6 10*3/uL — ABNORMAL HIGH (ref 4.0–10.5)
nRBC: 0 % (ref 0.0–0.2)

## 2023-06-22 LAB — COMPREHENSIVE METABOLIC PANEL
ALT: 17 U/L (ref 0–44)
AST: 33 U/L (ref 15–41)
Albumin: 4.3 g/dL (ref 3.5–5.0)
Alkaline Phosphatase: 95 U/L (ref 38–126)
Anion gap: 10 (ref 5–15)
BUN: 13 mg/dL (ref 8–23)
CO2: 25 mmol/L (ref 22–32)
Calcium: 8.9 mg/dL (ref 8.9–10.3)
Chloride: 111 mmol/L (ref 98–111)
Creatinine, Ser: 0.73 mg/dL (ref 0.61–1.24)
GFR, Estimated: >60 mL/min (ref >60)
Glucose, Bld: 127 mg/dL — ABNORMAL HIGH (ref 70–99)
Potassium: 3.4 mmol/L — ABNORMAL LOW (ref 3.5–5.1)
Sodium: 146 mmol/L — ABNORMAL HIGH (ref 135–145)
Total Bilirubin: 0.5 mg/dL (ref 0.0–1.2)
Total Protein: 7.4 g/dL (ref 6.5–8.1)

## 2023-06-22 LAB — TROPONIN I (HIGH SENSITIVITY)
Troponin I (High Sensitivity): 42 ng/L — ABNORMAL HIGH (ref <18)
Troponin I (High Sensitivity): 43 ng/L — ABNORMAL HIGH (ref <18)

## 2023-06-22 LAB — RESP PANEL BY RT-PCR (RSV, FLU A&B, COVID)  RVPGX2
Influenza A by PCR: NEGATIVE
Influenza B by PCR: NEGATIVE
Resp Syncytial Virus by PCR: NEGATIVE
SARS Coronavirus 2 by RT PCR: NEGATIVE

## 2023-06-22 LAB — BLOOD GAS, VENOUS
Acid-base deficit: 3.2 mmol/L — ABNORMAL HIGH (ref 0.0–2.0)
Bicarbonate: 24.7 mmol/L (ref 20.0–28.0)
O2 Saturation: 97 %
Patient temperature: 37
pCO2, Ven: 55 mm[Hg] (ref 44–60)
pH, Ven: 7.26 (ref 7.25–7.43)
pO2, Ven: 75 mm[Hg] — ABNORMAL HIGH (ref 32–45)

## 2023-06-22 MED ORDER — ALBUTEROL SULFATE (2.5 MG/3ML) 0.083% IN NEBU: 2.5000 mg | INHALATION_SOLUTION | RESPIRATORY_TRACT | Status: DC | PRN

## 2023-06-22 MED ORDER — POTASSIUM CHLORIDE CRYS ER 20 MEQ PO TBCR
40.0000 meq | EXTENDED_RELEASE_TABLET | Freq: Once | ORAL | Status: AC
  Administered 2023-06-22: 40 meq via ORAL
  Filled 2023-06-22: qty 2

## 2023-06-22 MED ORDER — ALBUTEROL SULFATE (2.5 MG/3ML) 0.083% IN NEBU
20.0000 mg/h | INHALATION_SOLUTION | Freq: Once | RESPIRATORY_TRACT | Status: AC
  Administered 2023-06-22: 20 mg/h via RESPIRATORY_TRACT
  Filled 2023-06-22: qty 24

## 2023-06-22 MED ORDER — IPRATROPIUM BROMIDE 0.02 % IN SOLN
1.0000 mg | Freq: Once | RESPIRATORY_TRACT | Status: AC
  Administered 2023-06-22: 1 mg via RESPIRATORY_TRACT
  Filled 2023-06-22: qty 5

## 2023-06-22 MED ORDER — METHYLPREDNISOLONE SODIUM SUCC 125 MG IJ SOLR: 60.0000 mg | Freq: Two times a day (BID) | INTRAMUSCULAR | Status: DC

## 2023-06-22 MED ORDER — METOCLOPRAMIDE HCL 5 MG/ML IJ SOLN
10.0000 mg | Freq: Once | INTRAMUSCULAR | Status: AC
  Administered 2023-06-22: 10 mg via INTRAVENOUS
  Filled 2023-06-22: qty 2

## 2023-06-22 MED ORDER — POLYETHYLENE GLYCOL 3350 17 G PO PACK: 17.0000 g | PACK | Freq: Every day | ORAL | Status: DC | PRN

## 2023-06-22 MED ORDER — AMLODIPINE BESYLATE 10 MG PO TABS
5.0000 mg | ORAL_TABLET | Freq: Every day | ORAL | Status: DC
  Administered 2023-06-23 – 2023-06-24 (×2): 5 mg via ORAL
  Filled 2023-06-22 (×2): qty 1

## 2023-06-22 MED ORDER — DIPHENHYDRAMINE HCL 50 MG/ML IJ SOLN
25.0000 mg | Freq: Once | INTRAMUSCULAR | Status: AC
  Administered 2023-06-22: 25 mg via INTRAVENOUS
  Filled 2023-06-22: qty 1

## 2023-06-22 MED ORDER — ENOXAPARIN SODIUM 40 MG/0.4ML IJ SOSY
40.0000 mg | PREFILLED_SYRINGE | INTRAMUSCULAR | Status: DC
  Administered 2023-06-23 – 2023-06-24 (×2): 40 mg via SUBCUTANEOUS
  Filled 2023-06-22 (×2): qty 0.4

## 2023-06-22 MED ORDER — CITALOPRAM HYDROBROMIDE 20 MG PO TABS
10.0000 mg | ORAL_TABLET | Freq: Every day | ORAL | Status: DC
  Administered 2023-06-23 – 2023-06-24 (×2): 10 mg via ORAL
  Filled 2023-06-22 (×2): qty 1

## 2023-06-22 MED ORDER — IPRATROPIUM-ALBUTEROL 0.5-2.5 (3) MG/3ML IN SOLN
3.0000 mL | Freq: Four times a day (QID) | RESPIRATORY_TRACT | Status: DC
  Administered 2023-06-23 (×4): 3 mL via RESPIRATORY_TRACT
  Filled 2023-06-22 (×4): qty 3

## 2023-06-22 MED ORDER — PROCHLORPERAZINE EDISYLATE 10 MG/2ML IJ SOLN
10.0000 mg | Freq: Once | INTRAMUSCULAR | Status: AC
  Administered 2023-06-22: 10 mg via INTRAVENOUS
  Filled 2023-06-22: qty 2

## 2023-06-22 MED ORDER — UMECLIDINIUM BROMIDE 62.5 MCG/ACT IN AEPB
1.0000 | INHALATION_SPRAY | Freq: Every day | RESPIRATORY_TRACT | Status: DC
Start: 2023-06-22 — End: 2023-06-23

## 2023-06-22 MED ORDER — BUTALBITAL-APAP-CAFFEINE 50-325-40 MG PO TABS
2.0000 | ORAL_TABLET | Freq: Four times a day (QID) | ORAL | Status: DC | PRN
Start: 2023-06-22 — End: 2023-06-24
  Administered 2023-06-23 (×2): 2 via ORAL
  Filled 2023-06-22 (×2): qty 2

## 2023-06-22 MED ORDER — MELATONIN 3 MG PO TABS: 6.0000 mg | ORAL_TABLET | Freq: Every evening | ORAL | Status: DC | PRN

## 2023-06-22 MED ORDER — SODIUM CHLORIDE 0.9 % IV BOLUS
500.0000 mL | Freq: Once | INTRAVENOUS | Status: AC
  Administered 2023-06-23: 500 mL via INTRAVENOUS

## 2023-06-22 MED ORDER — ONDANSETRON HCL 4 MG/2ML IJ SOLN
4.0000 mg | Freq: Once | INTRAMUSCULAR | Status: AC
  Administered 2023-06-22: 4 mg via INTRAVENOUS
  Filled 2023-06-22: qty 2

## 2023-06-22 MED ORDER — HYDRALAZINE HCL 20 MG/ML IJ SOLN
10.0000 mg | Freq: Once | INTRAMUSCULAR | Status: AC
  Administered 2023-06-22: 10 mg via INTRAVENOUS
  Filled 2023-06-22: qty 1

## 2023-06-22 MED ORDER — KETOROLAC TROMETHAMINE 30 MG/ML IJ SOLN
30.0000 mg | Freq: Once | INTRAMUSCULAR | Status: AC
Start: 2023-06-22 — End: 2023-06-22
  Administered 2023-06-22: 30 mg via INTRAVENOUS
  Filled 2023-06-22: qty 1

## 2023-06-22 MED ORDER — MAGNESIUM SULFATE 2 GM/50ML IV SOLN
2.0000 g | Freq: Once | INTRAVENOUS | Status: AC
  Administered 2023-06-22: 2 g via INTRAVENOUS
  Filled 2023-06-22: qty 50

## 2023-06-22 MED ORDER — ONDANSETRON HCL 4 MG/2ML IJ SOLN: 4.0000 mg | Freq: Four times a day (QID) | INTRAMUSCULAR | Status: DC | PRN

## 2023-06-22 MED ORDER — SPIRONOLACTONE 25 MG PO TABS
25.0000 mg | ORAL_TABLET | Freq: Every day | ORAL | Status: DC
  Administered 2023-06-23 – 2023-06-24 (×2): 25 mg via ORAL
  Filled 2023-06-22 (×3): qty 1

## 2023-06-22 MED ORDER — SODIUM CHLORIDE 0.9 % IV BOLUS
1000.0000 mL | Freq: Once | INTRAVENOUS | Status: AC
  Administered 2023-06-22: 1000 mL via INTRAVENOUS

## 2023-06-22 MED ORDER — SODIUM CHLORIDE 0.9% FLUSH
3.0000 mL | Freq: Two times a day (BID) | INTRAVENOUS | Status: DC
  Administered 2023-06-23 – 2023-06-24 (×4): 3 mL via INTRAVENOUS

## 2023-06-22 MED ORDER — SODIUM CHLORIDE 0.9 % IV BOLUS: 1000.0000 mL | Freq: Once | INTRAVENOUS | Status: DC

## 2023-06-22 MED ORDER — FLUTICASONE FUROATE-VILANTEROL 100-25 MCG/ACT IN AEPB
1.0000 | INHALATION_SPRAY | Freq: Every day | RESPIRATORY_TRACT | Status: DC
Start: 2023-06-22 — End: 2023-06-23

## 2023-06-22 NOTE — ED Notes (Signed)
Pt not tolerating BIPAP, continuously taking mask off. Pt educated on risks of not wearing mask and attempted to offer comfort measures such as repositioning and headache relief. Dr Silverio Lay to bedside and informed patient of risks of not wearing BIPAP. Pt placed on 3 L [place] per MD verbal order.

## 2023-06-22 NOTE — ED Notes (Addendum)
This NA assisted pt to use urinal to void at bedside.

## 2023-06-22 NOTE — Progress Notes (Signed)
ABG draw was unsuccessful MD aware and Verbal order to do VBG.

## 2023-06-22 NOTE — Progress Notes (Signed)
Pt found off of bipap. Pt is on [place] 3L, no resp distress. Per RN MD is aware. Machine remained bedside.

## 2023-06-22 NOTE — ED Notes (Signed)
Pt able to void using urinal, urine is at bedside.
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
## 2023-06-23 DIAGNOSIS — T59811A Toxic effect of smoke, accidental (unintentional), initial encounter: Secondary | ICD-10-CM | POA: Diagnosis present

## 2023-06-23 DIAGNOSIS — J9601 Acute respiratory failure with hypoxia: Secondary | ICD-10-CM | POA: Insufficient documentation

## 2023-06-23 DIAGNOSIS — I5A Non-ischemic myocardial injury (non-traumatic): Secondary | ICD-10-CM | POA: Insufficient documentation

## 2023-06-23 DIAGNOSIS — E870 Hyperosmolality and hypernatremia: Secondary | ICD-10-CM | POA: Insufficient documentation

## 2023-06-23 LAB — CBC
HCT: 35.9 % — ABNORMAL LOW (ref 39.0–52.0)
Hemoglobin: 10.9 g/dL — ABNORMAL LOW (ref 13.0–17.0)
MCH: 28.5 pg (ref 26.0–34.0)
MCHC: 30.4 g/dL (ref 30.0–36.0)
MCV: 93.7 fL (ref 80.0–100.0)
Platelets: 219 10*3/uL (ref 150–400)
RBC: 3.83 MIL/uL — ABNORMAL LOW (ref 4.22–5.81)
RDW: 14.6 % (ref 11.5–15.5)
WBC: 6.4 10*3/uL (ref 4.0–10.5)
nRBC: 0 % (ref 0.0–0.2)

## 2023-06-23 LAB — BASIC METABOLIC PANEL
Anion gap: 12 (ref 5–15)
BUN: 12 mg/dL (ref 8–23)
CO2: 20 mmol/L — ABNORMAL LOW (ref 22–32)
Calcium: 8.3 mg/dL — ABNORMAL LOW (ref 8.9–10.3)
Chloride: 104 mmol/L (ref 98–111)
Creatinine, Ser: 0.81 mg/dL (ref 0.61–1.24)
GFR, Estimated: >60 mL/min (ref >60)
Glucose, Bld: 369 mg/dL — ABNORMAL HIGH (ref 70–99)
Potassium: 4 mmol/L (ref 3.5–5.1)
Sodium: 136 mmol/L (ref 135–145)

## 2023-06-23 LAB — RESPIRATORY PANEL BY PCR

## 2023-06-23 LAB — BLOOD GAS, ARTERIAL
Acid-base deficit: 3.1 mmol/L — ABNORMAL HIGH (ref 0.0–2.0)
Bicarbonate: 23.2 mmol/L (ref 20.0–28.0)
Drawn by: 59133
O2 Saturation: 100 %
Patient temperature: 36.6
pCO2 arterial: 44 mm[Hg] (ref 32–48)
pH, Arterial: 7.33 — ABNORMAL LOW (ref 7.35–7.45)
pO2, Arterial: 119 mm[Hg] — ABNORMAL HIGH (ref 83–108)

## 2023-06-23 LAB — BLOOD GAS, VENOUS
Acid-base deficit: 2 mmol/L (ref 0.0–2.0)
Acid-base deficit: 3.2 mmol/L — ABNORMAL HIGH (ref 0.0–2.0)
Bicarbonate: 23.6 mmol/L (ref 20.0–28.0)
Bicarbonate: 25.1 mmol/L (ref 20.0–28.0)
O2 Saturation: 89.3 %
O2 Saturation: 91.1 %
Patient temperature: 37
Patient temperature: 37
pCO2, Ven: 48 mm[Hg] (ref 44–60)
pCO2, Ven: 51 mm[Hg] (ref 44–60)
pH, Ven: 7.3 (ref 7.25–7.43)
pH, Ven: 7.3 (ref 7.25–7.43)
pO2, Ven: 54 mm[Hg] — ABNORMAL HIGH (ref 32–45)
pO2, Ven: 55 mm[Hg] — ABNORMAL HIGH (ref 32–45)

## 2023-06-23 LAB — MAGNESIUM: Magnesium: 1.9 mg/dL (ref 1.7–2.4)

## 2023-06-23 LAB — PHOSPHORUS: Phosphorus: 2 mg/dL — ABNORMAL LOW (ref 2.5–4.6)

## 2023-06-23 LAB — HIV ANTIBODY (ROUTINE TESTING W REFLEX): HIV Screen 4th Generation wRfx: NONREACTIVE

## 2023-06-23 LAB — GLUCOSE, CAPILLARY
Glucose-Capillary: 159 mg/dL — ABNORMAL HIGH (ref 70–99)
Glucose-Capillary: 285 mg/dL — ABNORMAL HIGH (ref 70–99)

## 2023-06-23 MED ORDER — INSULIN GLARGINE-YFGN 100 UNIT/ML ~~LOC~~ SOLN
15.0000 [IU] | Freq: Two times a day (BID) | SUBCUTANEOUS | Status: DC
  Administered 2023-06-23 (×2): 15 [IU] via SUBCUTANEOUS
  Filled 2023-06-23 (×5): qty 0.15

## 2023-06-23 MED ORDER — ORAL CARE MOUTH RINSE: 15.0000 mL | OROMUCOSAL | Status: DC | PRN

## 2023-06-23 MED ORDER — INSULIN ASPART 100 UNIT/ML IJ SOLN
6.0000 [IU] | Freq: Three times a day (TID) | INTRAMUSCULAR | Status: DC
  Administered 2023-06-23 – 2023-06-24 (×2): 6 [IU] via SUBCUTANEOUS
  Filled 2023-06-23: qty 0.06

## 2023-06-23 MED ORDER — METHYLPREDNISOLONE SODIUM SUCC 40 MG IJ SOLR
40.0000 mg | Freq: Two times a day (BID) | INTRAMUSCULAR | Status: DC
  Administered 2023-06-23 – 2023-06-24 (×3): 40 mg via INTRAVENOUS
  Filled 2023-06-23 (×3): qty 1

## 2023-06-23 MED ORDER — UMECLIDINIUM BROMIDE 62.5 MCG/ACT IN AEPB
1.0000 | INHALATION_SPRAY | Freq: Every day | RESPIRATORY_TRACT | Status: DC
  Administered 2023-06-24: 1 via RESPIRATORY_TRACT
  Filled 2023-06-23: qty 7

## 2023-06-23 MED ORDER — INSULIN ASPART 100 UNIT/ML IJ SOLN
0.0000 [IU] | Freq: Every day | INTRAMUSCULAR | Status: DC
  Administered 2023-06-23: 3 [IU] via SUBCUTANEOUS
  Filled 2023-06-23: qty 0.05

## 2023-06-23 MED ORDER — K PHOS MONO-SOD PHOS DI & MONO 155-852-130 MG PO TABS
500.0000 mg | ORAL_TABLET | Freq: Two times a day (BID) | ORAL | Status: AC
  Administered 2023-06-23 (×2): 500 mg via ORAL
  Filled 2023-06-23 (×2): qty 2

## 2023-06-23 MED ORDER — DOXYCYCLINE HYCLATE 100 MG PO TABS
100.0000 mg | ORAL_TABLET | Freq: Two times a day (BID) | ORAL | Status: DC
  Administered 2023-06-23 – 2023-06-24 (×3): 100 mg via ORAL
  Filled 2023-06-23 (×3): qty 1

## 2023-06-23 MED ORDER — FLUTICASONE FUROATE-VILANTEROL 100-25 MCG/ACT IN AEPB
1.0000 | INHALATION_SPRAY | Freq: Every day | RESPIRATORY_TRACT | Status: DC
  Administered 2023-06-24: 1 via RESPIRATORY_TRACT
  Filled 2023-06-23: qty 28

## 2023-06-23 MED ORDER — INSULIN ASPART 100 UNIT/ML IJ SOLN
0.0000 [IU] | Freq: Three times a day (TID) | INTRAMUSCULAR | Status: DC
  Administered 2023-06-23: 3 [IU] via SUBCUTANEOUS
  Filled 2023-06-23: qty 0.15

## 2023-06-23 NOTE — Inpatient Diabetes Management (Signed)
Inpatient Diabetes Program Recommendations  AACE/ADA: New Consensus Statement on Inpatient Glycemic Control   Target Ranges:  Prepandial:   less than 140 mg/dL      Peak postprandial:   less than 180 mg/dL (1-2 hours)      Critically ill patients:  140 - 180 mg/dL    Latest Reference Range & Units 06/22/23 19:23 06/23/23 05:50  Glucose 70 - 99 mg/dL 161 (H) 096 (H)    Latest Reference Range & Units 10/29/21 07:51  Hemoglobin A1C 4.8 - 5.6 % 5.5   Review of Glycemic Control  Diabetes history: No Outpatient Diabetes medications: NA Current orders for Inpatient glycemic control: None; Solumedrol 40 mg Q12H  Inpatient Diabetes Program Recommendations:    Insulin: Lab glucose 369 mg/dl this morning. While ordered steroids, consider ordering CBGs AC&HS and Novolog 0-9 units TID with meals and Novolog 0-5 units at bedtime.  HbgA1C: Please consider ordering an A1C to evaluate glycemic control over the past 2-3 months.  Thanks, Orlando Penner, RN, MSN, CDCES Diabetes Coordinator Inpatient Diabetes Program [phone] (Team Pager from 8am to 5pm)
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
## 2023-06-23 NOTE — ED Notes (Signed)
Pt provided with graham crackers and shasta lemon lime soda, per request and RN approval.

## 2023-06-23 NOTE — Progress Notes (Signed)
 ABG collected and send down to lab for analysis. Lab notified.

## 2023-06-23 NOTE — ED Notes (Signed)
 Pt provided with urinal

## 2023-06-24 ENCOUNTER — Other Ambulatory Visit (HOSPITAL_COMMUNITY): Payer: Self-pay

## 2023-06-24 DIAGNOSIS — T59811A Toxic effect of smoke, accidental (unintentional), initial encounter: Secondary | ICD-10-CM | POA: Diagnosis not present

## 2023-06-24 LAB — BASIC METABOLIC PANEL
Anion gap: 9 (ref 5–15)
BUN: 15 mg/dL (ref 8–23)
CO2: 26 mmol/L (ref 22–32)
Calcium: 9.3 mg/dL (ref 8.9–10.3)
Chloride: 105 mmol/L (ref 98–111)
Creatinine, Ser: 0.72 mg/dL (ref 0.61–1.24)
GFR, Estimated: >60 mL/min (ref >60)
Glucose, Bld: 127 mg/dL — ABNORMAL HIGH (ref 70–99)
Potassium: 4.4 mmol/L (ref 3.5–5.1)
Sodium: 140 mmol/L (ref 135–145)

## 2023-06-24 LAB — HEMOGLOBIN A1C
Hgb A1c MFr Bld: 5.4 % (ref 4.8–5.6)
Mean Plasma Glucose: 108 mg/dL

## 2023-06-24 LAB — CARBON MONOXIDE, BLOOD (PERFORMED AT REF LAB): Carbon Monoxide, Blood: 5 % — ABNORMAL HIGH (ref 0.0–3.6)

## 2023-06-24 LAB — GLUCOSE, CAPILLARY: Glucose-Capillary: 97 mg/dL (ref 70–99)

## 2023-06-24 MED ORDER — PREDNISONE 20 MG PO TABS: 20.0000 mg | ORAL_TABLET | Freq: Every day | ORAL | Status: DC

## 2023-06-24 MED ORDER — IPRATROPIUM-ALBUTEROL 0.5-2.5 (3) MG/3ML IN SOLN
3.0000 mL | Freq: Three times a day (TID) | RESPIRATORY_TRACT | Status: DC
  Administered 2023-06-24: 3 mL via RESPIRATORY_TRACT
  Filled 2023-06-24: qty 3

## 2023-06-24 MED ORDER — PREDNISONE 20 MG PO TABS
20.0000 mg | ORAL_TABLET | Freq: Every day | ORAL | 0 refills | Status: AC
  Filled 2023-06-24: qty 2, 2d supply, fill #0

## 2023-06-24 MED ORDER — DOXYCYCLINE HYCLATE 100 MG PO TABS
100.0000 mg | ORAL_TABLET | Freq: Two times a day (BID) | ORAL | 0 refills | Status: AC
  Filled 2023-06-24: qty 6, 3d supply, fill #0

## 2023-06-24 NOTE — Plan of Care (Signed)
  Problem: Metabolic: Goal: Ability to maintain appropriate glucose levels will improve Outcome: Progressing   Problem: Clinical Measurements: Goal: Diagnostic test results will improve Outcome: Progressing Goal: Respiratory complications will improve Outcome: Progressing Goal: Cardiovascular complication will be avoided Outcome: Progressing   Problem: Respiratory: Goal: Ability to maintain a clear airway will improve Outcome: Progressing Goal: Levels of oxygenation will improve Outcome: Progressing Goal: Ability to maintain adequate ventilation will improve Outcome: Progressing

## 2023-06-24 NOTE — Progress Notes (Signed)
   06/24/23 0854  TOC Brief Assessment  Insurance and Status Reviewed  Patient has primary care physician Yes  Home environment has been reviewed Resides in single family home with children  Prior level of function: Independent with ADLs at baseline  Prior/Current Home Services No current home services  Social Drivers of Health Review SDOH reviewed no interventions necessary  Readmission risk has been reviewed Yes  Transition of care needs no transition of care needs at this time
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
